# Patient Record
Sex: Male | Born: 1960 | Race: White | Hispanic: No | Smoking: Never smoker
Health system: Southern US, Community
[De-identification: ages and names within clinical notes are randomized; demographics above are authoritative.]

## PROBLEM LIST (undated history)

## (undated) DIAGNOSIS — F1010 Alcohol abuse, uncomplicated: Secondary | ICD-10-CM

## (undated) DIAGNOSIS — F19239 Other psychoactive substance dependence with withdrawal, unspecified: Secondary | ICD-10-CM

## (undated) DIAGNOSIS — K859 Acute pancreatitis without necrosis or infection, unspecified: Secondary | ICD-10-CM

## (undated) DIAGNOSIS — M542 Cervicalgia: Secondary | ICD-10-CM

## (undated) DIAGNOSIS — M549 Dorsalgia, unspecified: Secondary | ICD-10-CM

## (undated) DIAGNOSIS — R569 Unspecified convulsions: Secondary | ICD-10-CM

## (undated) DIAGNOSIS — F319 Bipolar disorder, unspecified: Secondary | ICD-10-CM

---

## 2006-05-03 ENCOUNTER — Emergency Department (HOSPITAL_COMMUNITY): Admission: EM | Admit: 2006-05-03 | Discharge: 2006-05-03 | Payer: Self-pay | Admitting: Emergency Medicine

## 2006-06-10 ENCOUNTER — Emergency Department (HOSPITAL_COMMUNITY): Admission: EM | Admit: 2006-06-10 | Discharge: 2006-06-10 | Payer: Self-pay | Admitting: Emergency Medicine

## 2007-01-14 ENCOUNTER — Emergency Department (HOSPITAL_COMMUNITY): Admission: EM | Admit: 2007-01-14 | Discharge: 2007-01-14 | Payer: Self-pay | Admitting: Emergency Medicine

## 2009-06-19 ENCOUNTER — Emergency Department (HOSPITAL_COMMUNITY): Admission: EM | Admit: 2009-06-19 | Discharge: 2009-06-20 | Payer: Self-pay | Admitting: Emergency Medicine

## 2009-06-20 ENCOUNTER — Inpatient Hospital Stay (HOSPITAL_COMMUNITY): Admission: AD | Admit: 2009-06-20 | Discharge: 2009-06-25 | Payer: Self-pay | Admitting: Psychiatry

## 2009-06-20 ENCOUNTER — Ambulatory Visit: Payer: Self-pay | Admitting: Psychiatry

## 2010-06-10 LAB — HEPATIC FUNCTION PANEL
Albumin: 3.9 g/dL (ref 3.5–5.2)
Indirect Bilirubin: 0.7 mg/dL (ref 0.3–0.9)
Total Protein: 7.3 g/dL (ref 6.0–8.3)

## 2010-06-15 LAB — CBC
HCT: 46 % (ref 39.0–52.0)
MCHC: 35 g/dL (ref 30.0–36.0)
MCV: 95 fL (ref 78.0–100.0)
Platelets: 307 10*3/uL (ref 150–400)
RDW: 14.6 % (ref 11.5–15.5)
WBC: 9.8 10*3/uL (ref 4.0–10.5)

## 2010-06-15 LAB — DIFFERENTIAL
Eosinophils Absolute: 0 10*3/uL (ref 0.0–0.7)
Eosinophils Relative: 0 % (ref 0–5)
Lymphs Abs: 2.9 10*3/uL (ref 0.7–4.0)

## 2010-06-15 LAB — BASIC METABOLIC PANEL
BUN: 3 mg/dL — ABNORMAL LOW (ref 6–23)
CO2: 20 mEq/L (ref 19–32)
Chloride: 101 mEq/L (ref 96–112)
Glucose, Bld: 141 mg/dL — ABNORMAL HIGH (ref 70–99)
Potassium: 3.2 mEq/L — ABNORMAL LOW (ref 3.5–5.1)

## 2010-06-15 LAB — RAPID URINE DRUG SCREEN, HOSP PERFORMED
Barbiturates: NOT DETECTED
Benzodiazepines: NOT DETECTED
Cocaine: NOT DETECTED

## 2010-07-20 ENCOUNTER — Inpatient Hospital Stay (HOSPITAL_COMMUNITY): Payer: Self-pay

## 2010-07-20 ENCOUNTER — Inpatient Hospital Stay (HOSPITAL_COMMUNITY)
Admission: EM | Admit: 2010-07-20 | Discharge: 2010-07-24 | DRG: 896 | Disposition: A | Discharge status: Home / Self Care | Payer: Self-pay | Attending: Internal Medicine | Admitting: Internal Medicine

## 2010-07-20 DIAGNOSIS — F10931 Alcohol use, unspecified with withdrawal delirium: Principal | ICD-10-CM | POA: Diagnosis present

## 2010-07-20 DIAGNOSIS — F1020 Alcohol dependence, uncomplicated: Secondary | ICD-10-CM | POA: Diagnosis present

## 2010-07-20 DIAGNOSIS — I10 Essential (primary) hypertension: Secondary | ICD-10-CM | POA: Diagnosis present

## 2010-07-20 DIAGNOSIS — K859 Acute pancreatitis without necrosis or infection, unspecified: Secondary | ICD-10-CM | POA: Diagnosis present

## 2010-07-20 DIAGNOSIS — R55 Syncope and collapse: Secondary | ICD-10-CM | POA: Diagnosis present

## 2010-07-20 DIAGNOSIS — E871 Hypo-osmolality and hyponatremia: Secondary | ICD-10-CM | POA: Diagnosis present

## 2010-07-20 DIAGNOSIS — F10231 Alcohol dependence with withdrawal delirium: Principal | ICD-10-CM | POA: Diagnosis present

## 2010-07-20 LAB — URINALYSIS, ROUTINE W REFLEX MICROSCOPIC
Glucose, UA: NEGATIVE mg/dL
Hgb urine dipstick: NEGATIVE
Specific Gravity, Urine: 1.014 (ref 1.005–1.030)
pH: 6 (ref 5.0–8.0)

## 2010-07-20 LAB — CBC
MCH: 33.7 pg (ref 26.0–34.0)
MCHC: 36.1 g/dL — ABNORMAL HIGH (ref 30.0–36.0)
Platelets: 367 10*3/uL (ref 150–400)
RBC: 4.13 MIL/uL — ABNORMAL LOW (ref 4.22–5.81)

## 2010-07-20 LAB — DIFFERENTIAL
Basophils Relative: 0 % (ref 0–1)
Eosinophils Absolute: 0 10*3/uL (ref 0.0–0.7)
Monocytes Relative: 4 % (ref 3–12)
Neutrophils Relative %: 86 % — ABNORMAL HIGH (ref 43–77)

## 2010-07-20 LAB — COMPREHENSIVE METABOLIC PANEL
AST: 58 U/L — ABNORMAL HIGH (ref 0–37)
Albumin: 4.3 g/dL (ref 3.5–5.2)
Alkaline Phosphatase: 67 U/L (ref 39–117)
Chloride: 87 mEq/L — ABNORMAL LOW (ref 96–112)
GFR calc Af Amer: >60 mL/min (ref >60)
Potassium: 4.8 mEq/L (ref 3.5–5.1)
Sodium: 128 mEq/L — ABNORMAL LOW (ref 135–145)
Total Bilirubin: 1.3 mg/dL — ABNORMAL HIGH (ref 0.3–1.2)

## 2010-07-20 LAB — RAPID URINE DRUG SCREEN, HOSP PERFORMED
Barbiturates: NOT DETECTED
Benzodiazepines: NOT DETECTED

## 2010-07-20 LAB — ETHANOL: Alcohol, Ethyl (B): 339 mg/dL — ABNORMAL HIGH (ref 0–10)

## 2010-07-21 ENCOUNTER — Inpatient Hospital Stay (HOSPITAL_COMMUNITY): Payer: Self-pay

## 2010-07-21 LAB — BASIC METABOLIC PANEL
BUN: 8 mg/dL (ref 6–23)
Chloride: 103 mEq/L (ref 96–112)
Creatinine, Ser: 0.75 mg/dL (ref 0.4–1.5)
Glucose, Bld: 94 mg/dL (ref 70–99)

## 2010-07-21 LAB — CBC
HCT: 36.4 % — ABNORMAL LOW (ref 39.0–52.0)
MCH: 32.6 pg (ref 26.0–34.0)
MCV: 95.8 fL (ref 78.0–100.0)
Platelets: 227 10*3/uL (ref 150–400)
RBC: 3.8 MIL/uL — ABNORMAL LOW (ref 4.22–5.81)
RDW: 13.7 % (ref 11.5–15.5)

## 2010-07-21 LAB — OSMOLALITY, URINE: Osmolality, Ur: 399 mOsm/kg (ref 390–1090)

## 2010-07-22 DIAGNOSIS — F1020 Alcohol dependence, uncomplicated: Secondary | ICD-10-CM

## 2010-07-22 LAB — BASIC METABOLIC PANEL
BUN: 4 mg/dL — ABNORMAL LOW (ref 6–23)
CO2: 25 mEq/L (ref 19–32)
GFR calc non Af Amer: >60 mL/min (ref >60)
Glucose, Bld: 162 mg/dL — ABNORMAL HIGH (ref 70–99)
Potassium: 3.6 mEq/L (ref 3.5–5.1)

## 2010-07-26 NOTE — Discharge Summary (Signed)
[name], LAMPERT                  ACCOUNT NO.:  [id number]  MEDICAL RECORD NO.:  [id number]           PATIENT TYPE:  I  LOCATION:  1520                         FACILITY:  [facility]  PHYSICIAN:  Brendia Sacks, MD    DATE OF BIRTH:  11-Jan-1961  DATE OF ADMISSION:  07/20/2010 DATE OF DISCHARGE:  07/24/2010                              DISCHARGE SUMMARY   PRIMARY CARE PHYSICIAN:  None, although he does see [facility].  CONDITION ON DISCHARGE:  Improved.  DISCHARGE DIAGNOSES: 1. Alcohol withdrawal, acute, resolved. 2. Hyponatremia secondary to potomania, resolved. 3. Hypertension, stable. 4. Possible acute versus chronic pancreatitis, resolved. 5. History of bipolar disorder, not currently on any medications.  HISTORY OF PRESENT ILLNESS:  This is a 50 year old Craig [name] who presented to the emergency room with acute alcohol intoxication and symptoms of acute alcohol withdrawal.  He was seen in consultation with Psychiatry because of his acute alcohol withdrawal.  He was admitted for further evaluation and treatment.  HOSPITAL COURSE: 1. Acute alcohol withdrawal, mild in nature.  The patient was admitted     to the medical floor.  He was placed on CIWA withdrawal protocol.     He is also seen in consultation with Psychiatry and clinical social     work.  The patient's withdrawal symptoms have resolved.  He is     feeling quite well and ready to go home.  He was offered inpatient     rehab but he did refuse this.  He was also offered outpatient     resources but again did not wish to pursue any of these.  He had no     additional Ativan since May 3 in the morning.  He is now stable for     discharge.  He has been counseled on alcohol cessation. 2. Alcohol intoxication.  This resolved with supportive care. 3. Hyponatremia secondary to potomania, resolved. 4. Hypertension.  This has remained stable.  He is placed on clonidine     and continue on b.i.d. therapy.  Again,  follow up in the outpatient     setting. 5. Possible acute versus subacute pancreatitis.  This resolved with     supportive care.  Consider further evaluation in the outpatient     setting as clinically indicated.  However, the patient discontinued     alcohol, this may not be an issue in the outpatient setting. 6. History of bipolar disorder.  He is not currently on any     medications and no more recommended at this time by Psychiatry.  CONSULTATIONS:  Psychiatry, recommendations as above.  I have discussed the case personally with Dr. Rogers Blocker on May 2, and it was felt that the patient was stable for discharge when his withdrawal is improved from Dr. Alvie Heidelberg standpoint.  PROCEDURES:  None.  IMAGING: 1. CT of the head April 30:  Mild premature atrophy.  No acute     intracranial findings. 2. Chest x-ray May 1:  Low lung volumes and bibasilar atelectasis. 3. Abdominal film May 1:  Negative.  No evidence of pancreatic  calcifications or other radiographic abnormality.  MICROBIOLOGY:  None.  PERTINENT LABORATORY STUDIES: 1. Urine drug screen was negative. 2. Serum alcohol on admission was 339. 3. Serum lipase was within normal limits. 4. Basic metabolic panel notable for sodium of 128 on admission and on     discharge 133.  Random glucose was mildly elevated at 162.  CBC     essentially unremarkable with a hemoglobin of 12.4 which appears to     be stable.  White blood cell count within normal limits on     discharge.  PHYSICAL EXAMINATION ON DISCHARGE:  GENERAL:  The patient is feeling quite while.  He is ready to go home.  He is eating well. VITAL SIGNS:  Temperature 98.3, pulse 77, respirations 18, blood pressure 120/79, saturation 95% on room air. CARDIOVASCULAR:  Regular rate and rhythm.  No murmur, rub or gallop. RESPIRATORY:  Clear to auscultation bilaterally.  No wheezes, rales or rhonchi.  Normal respiratory effort.  Speech is fluent and clear.  There is no  anxiousness or tremulousness seen.  DISCHARGE INSTRUCTIONS:  The patient will be discharged home today.  He has been provided resources by clinical social work as apparently he is homeless, but he has declined shelters or other assistance.  DIET:  Low-sodium, heart-healthy.  ACTIVITY:  Unrestricted.  RECOMMENDATIONS:  Recommend stop smoking, complete alcohol cessation. He should follow up in the [facility] in 1 to 2 weeks.  DISCHARGE MEDICATIONS: 1. Clonidine 0.1 mg p.o. b.i.d. 2. Multivitamin p.o. daily. 3. Zolpidem 5 mg one-half tablet p.o. q.h.s. as needed for insomnia.     Do not take this medication with alcohol.  Note, the patient has not been on Haldol or Cogentin in the outpatient setting.  Time coordinating discharge is Craig minutes.    Brendia Sacks, MD    DG/MEDQ  D:  07/24/2010  T:  07/24/2010  Job:  [id number]  Electronically Signed by Brendia Sacks  on 07/26/2010 04:34:34 PM

## 2010-07-27 NOTE — Discharge Summary (Signed)
  NAMECLENT, DAMORE                  ACCOUNT NO.:  [id number]  MEDICAL RECORD NO.:  [id number]           PATIENT TYPE:  I  LOCATION:  1520                         FACILITY:  [facility]  PHYSICIAN:  Brendia Sacks, MD    DATE OF BIRTH:  10-Jun-1960  DATE OF ADMISSION:  07/20/2010 DATE OF DISCHARGE:  07/24/2010                              DISCHARGE SUMMARY   ADDENDUM  This is a brief addendum to the patient's hospitalization from July 20, 2010, to Jul 24, 2010.  DISCHARGE MEDICATIONS:  Patient was discharged home on: 1. Multivitamin p.o. daily. 2. Zolpidem 5 mg 1/2 tablet p.o. q.h.s. as needed. 3. Cogentin 1 tablet p.o. q.h.s. 4. Haldol 2 mg p.o. q.h.s.  Discontinue the following medications:  Benadryl.  Note that the patient takes Cogentin and Haldol as an outpatient as per [facility].     Brendia Sacks, MD     DG/MEDQ  D:  07/26/2010  T:  07/26/2010  Job:  [id number]  Electronically Signed by Brendia Sacks  on 07/27/2010 06:02:17 PM

## 2010-08-19 NOTE — H&P (Signed)
NAMEEPHREM, Craig [name]                  ACCOUNT NO.:  [id number]  MEDICAL RECORD NO.:  [id number]           PATIENT TYPE:  E  LOCATION:  WLED                         FACILITY:  [facility]  PHYSICIAN:  Mauro Kaufmann, MD         DATE OF BIRTH:  06-17-1960  DATE OF ADMISSION:  07/20/2010 DATE OF DISCHARGE:                             HISTORY & PHYSICAL   PRIMARY CARE PHYSICIAN:  The patient has no signed primary care physician.  CHIEF COMPLAINT:  Alcohol intoxication.  HISTORY OF PRESENT ILLNESS:  This is a 50 year old male who came to the hospital after he says that he has been drinking alcohol for past 7 days.  The patient says that he does have a history of alcohol abuse and has been drinking since he was 50 years old, but over the past 7 days, he has been drinking almost every day from morning to evening.  The patient says that he drank almost 10-12 40 ounce beers every day.  The reason for the binge drinking is that the patient says that he is having some issues with his girlfriend and to cover the emotional pain, he is drinking alcohol.  He admits to passing out from drinking alcohol and also admits to having nausea and vomiting, but he denies any head injury.  Denies any seizures.  No history of any recent chest pain, but he admits to having some anxiety and some associated shortness of breath along with that.  Denies any abdominal pain, though he has a history of pancreatitis in the past.  PAST MEDICAL HISTORY:  Significant for, 1. Chronic alcohol abuse. 2. Bipolar disorder. 3. Depression. 4. Pancreatitis. 5. Alcohol withdrawal seizures.  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  The patient says that he drinks most of the days of the week and the only day he does not drink is when he cannot afford it.  He denies any tobacco abuse.  Denies any illicit drug abuse.  The patient is not married.  He lives with his  girlfriend.  ALLERGIES:  None.  FAMILY HISTORY:  Noncontributory.  REVIEW OF SYSTEMS:  There is positive headache, positive blurred vision, possible passing out spell.  NECK:  No history of thyroid problems. CHEST:  No history of emphysema or asthma.  HEART:  No history of CAD. GI: As in HPI.  GENITOURINARY:  No dysuria, urgency or frequency of urination.  NEUROLOGICAL:  There is positive history of seizures due to the alcohol withdrawal.  No history of stroke in the past.  CURRENT MEDICATIONS: 1. Haldol 2 mg. 2. Cogentin. The patient was prescribed these medications from [facility] after he told that he was having alcohol withdrawal.     The patient says that he was prescribed these medications a month     ago.  PHYSICAL EXAMINATION:  VITAL SIGNS:  The patient's blood pressure is 152/107, temperature is 98.5, pulse 112, respirations 20, pulse ox 95%. HEENT:  Head is atraumatic, normocephalic.  Eyes, extraocular movements are  intact.  Oral mucosa is pink and moist. NECK:  Supple. CHEST:  Clear to auscultation bilaterally. HEART:  S1 and S2 is regular in rate and rhythm. ABDOMEN:  Mild diffuse tenderness noted, but there is no rigidity or guarding. EXTREMITIES:  There is no cyanosis, clubbing, or edema. NEUROLOGICAL:  Cranial nerve II through XII are grossly intact.  Motor strength is 5/5 on both upper and lower extremities.  There is no focal deficit noted at this time.  PERTINENT LABS:  WBC of 13.8, hemoglobin is 13.9, hematocrit 38.5, platelet count of 367, neutrophils 86%.  Sodium 128, potassium is 4.8, chloride is 87, CO2 of 22, BUN 11, creatinine 0.58.  Glucose is 116. AST is 58, ALT is 40.  UA is only shows trace ketones, alcohol level is 339.  Urine drug screen is negative.  ASSESSMENT: 1. Alcohol intoxication. 2. Hyponatremia. 3. Elevated blood pressure. 4. Leukocytosis.  PLAN: 1. Alcohol intoxication.  The patient will be admitted to the medical      regular floor along with telemetry and we will put on alcohol     withdrawal precautions.  I will put the patient on Ativan, CIWA     protocol.  Also the patient will be started on thiamine and folic     acid. 2. Hyponatremia.  This is most likely secondary to beer potomania.  We     are going to check the urine and serum osmolality and also follow     the BMET in the morning. 3. Elevated blood pressure.  This is most likely secondary to alcohol     withdrawal.  I am going to start the patient on hydralazine 25 mg     p.o. q.6 h. p.r.n. for BP more than 160/100. 4. Bipolar disorder.  The patient was seen by psych in the ER and     needs to follow up with a psych once his acute alcohol issues are     resolved and he is to either go to the [facility] or he     will be seen by psych in the hospital. 5. Deep venous thrombosis prophylaxis with SCDs.  I am going to obtain     a CT scan of the head to rule out any underlying intracranial     pathology.  Then, the patient can be put on Lovenox. 6. Syncope most likely secondary to the alcohol intoxication as the     patient says that he kept on drinking alcohol until he passed out     and then after he regained consciousness, he would start drinking     again.  So I am going to obtain a CT scan of the head to rule out     any intracranial pathology this time.  Again there is no     neurological deficit noted at this time.    Mauro Kaufmann, MD    GL/MEDQ  D:  07/20/2010  T:  07/20/2010  Job:  [id number]  Electronically Signed by Mauro Kaufmann  on 08/19/2010 03:37:38 PM

## 2010-11-21 ENCOUNTER — Emergency Department (HOSPITAL_COMMUNITY): Payer: Self-pay

## 2010-11-21 ENCOUNTER — Emergency Department (HOSPITAL_COMMUNITY)
Admission: EM | Admit: 2010-11-21 | Discharge: 2010-11-22 | Disposition: A | Discharge status: Home / Self Care | Payer: Self-pay | Attending: Emergency Medicine | Admitting: Emergency Medicine

## 2010-11-21 DIAGNOSIS — F29 Unspecified psychosis not due to a substance or known physiological condition: Secondary | ICD-10-CM | POA: Insufficient documentation

## 2010-11-21 DIAGNOSIS — IMO0002 Reserved for concepts with insufficient information to code with codable children: Secondary | ICD-10-CM | POA: Insufficient documentation

## 2010-11-21 DIAGNOSIS — W19XXXA Unspecified fall, initial encounter: Secondary | ICD-10-CM | POA: Insufficient documentation

## 2010-11-21 DIAGNOSIS — F1010 Alcohol abuse, uncomplicated: Secondary | ICD-10-CM | POA: Insufficient documentation

## 2010-11-21 LAB — DIFFERENTIAL
Basophils Absolute: 0 10*3/uL (ref 0.0–0.1)
Eosinophils Relative: 2 % (ref 0–5)
Lymphocytes Relative: 39 % (ref 12–46)
Lymphs Abs: 2.4 10*3/uL (ref 0.7–4.0)
Monocytes Absolute: 0.5 10*3/uL (ref 0.1–1.0)
Monocytes Relative: 7 % (ref 3–12)

## 2010-11-21 LAB — CBC
HCT: 35.5 % — ABNORMAL LOW (ref 39.0–52.0)
MCH: 33.2 pg (ref 26.0–34.0)
MCHC: 35.2 g/dL (ref 30.0–36.0)
MCV: 94.2 fL (ref 78.0–100.0)
RDW: 13.8 % (ref 11.5–15.5)

## 2010-11-22 LAB — COMPREHENSIVE METABOLIC PANEL
ALT: 30 U/L (ref 0–53)
AST: 34 U/L (ref 0–37)
Albumin: 3.6 g/dL (ref 3.5–5.2)
Alkaline Phosphatase: 64 U/L (ref 39–117)
BUN: 9 mg/dL (ref 6–23)
CO2: 22 mEq/L (ref 19–32)
Chloride: 112 mEq/L (ref 96–112)
Glucose, Bld: 116 mg/dL — ABNORMAL HIGH (ref 70–99)
Total Protein: 7 g/dL (ref 6.0–8.3)

## 2010-11-22 LAB — ETHANOL: Alcohol, Ethyl (B): 382 mg/dL — ABNORMAL HIGH (ref 0–11)

## 2010-12-30 LAB — CBC
HCT: 42
Hemoglobin: 15
MCHC: 35.7
RDW: 14.6 — ABNORMAL HIGH

## 2010-12-30 LAB — URINALYSIS, ROUTINE W REFLEX MICROSCOPIC
Glucose, UA: NEGATIVE
Hgb urine dipstick: NEGATIVE
Specific Gravity, Urine: 1.005
Urobilinogen, UA: 0.2
pH: 6

## 2010-12-30 LAB — RAPID URINE DRUG SCREEN, HOSP PERFORMED
Amphetamines: NOT DETECTED
Barbiturates: NOT DETECTED
Opiates: NOT DETECTED

## 2010-12-30 LAB — DIFFERENTIAL
Basophils Absolute: 0.1
Basophils Relative: 1
Eosinophils Absolute: 0
Eosinophils Relative: 0
Monocytes Absolute: 0.5

## 2010-12-30 LAB — BASIC METABOLIC PANEL
CO2: 24
Calcium: 8.4
Glucose, Bld: 114 — ABNORMAL HIGH
Potassium: 4
Sodium: 131 — ABNORMAL LOW

## 2011-01-02 ENCOUNTER — Emergency Department (HOSPITAL_COMMUNITY): Payer: Self-pay

## 2011-01-02 ENCOUNTER — Emergency Department (HOSPITAL_COMMUNITY)
Admission: EM | Admit: 2011-01-02 | Discharge: 2011-01-03 | Disposition: A | Discharge status: Home / Self Care | Payer: Self-pay | Attending: Emergency Medicine | Admitting: Emergency Medicine

## 2011-01-02 DIAGNOSIS — R51 Headache: Secondary | ICD-10-CM | POA: Insufficient documentation

## 2011-01-02 DIAGNOSIS — F1010 Alcohol abuse, uncomplicated: Secondary | ICD-10-CM | POA: Insufficient documentation

## 2011-01-02 LAB — ETHANOL: Alcohol, Ethyl (B): 380 mg/dL — ABNORMAL HIGH (ref 0–11)

## 2011-01-02 LAB — POCT I-STAT, CHEM 8
Calcium, Ion: 1.06 mmol/L — ABNORMAL LOW (ref 1.12–1.32)
HCT: 36 % — ABNORMAL LOW (ref 39.0–52.0)
TCO2: 22 mmol/L (ref 0–100)

## 2011-01-03 ENCOUNTER — Emergency Department (HOSPITAL_COMMUNITY)
Admission: EM | Admit: 2011-01-03 | Discharge: 2011-01-03 | Disposition: A | Discharge status: Home / Self Care | Payer: Self-pay | Attending: Emergency Medicine | Admitting: Emergency Medicine

## 2011-01-03 ENCOUNTER — Emergency Department (HOSPITAL_COMMUNITY): Payer: Self-pay

## 2011-01-03 ENCOUNTER — Emergency Department (HOSPITAL_COMMUNITY)
Admission: EM | Admit: 2011-01-03 | Discharge: 2011-01-04 | Disposition: A | Discharge status: Home / Self Care | Payer: Self-pay | Attending: Emergency Medicine | Admitting: Emergency Medicine

## 2011-01-03 DIAGNOSIS — F1010 Alcohol abuse, uncomplicated: Secondary | ICD-10-CM | POA: Insufficient documentation

## 2011-01-03 DIAGNOSIS — R000 Tachycardia, unspecified: Secondary | ICD-10-CM | POA: Insufficient documentation

## 2011-01-03 DIAGNOSIS — H5500 Unspecified nystagmus: Secondary | ICD-10-CM | POA: Insufficient documentation

## 2011-01-03 LAB — BASIC METABOLIC PANEL
CO2: 23 mEq/L (ref 19–32)
Chloride: 111 mEq/L (ref 96–112)
Creatinine, Ser: 0.53 mg/dL (ref 0.50–1.35)
Potassium: 3.7 mEq/L (ref 3.5–5.1)

## 2011-01-03 LAB — CBC
Hemoglobin: 11.1 g/dL — ABNORMAL LOW (ref 13.0–17.0)
MCH: 33.1 pg (ref 26.0–34.0)
MCV: 95.5 fL (ref 78.0–100.0)
RBC: 3.35 MIL/uL — ABNORMAL LOW (ref 4.22–5.81)

## 2011-01-03 LAB — DIFFERENTIAL
Basophils Relative: 0 % (ref 0–1)
Lymphs Abs: 1.9 10*3/uL (ref 0.7–4.0)
Monocytes Relative: 6 % (ref 3–12)
Neutro Abs: 3.4 10*3/uL (ref 1.7–7.7)
Neutrophils Relative %: 60 % (ref 43–77)

## 2011-01-03 LAB — ETHANOL: Alcohol, Ethyl (B): 341 mg/dL — ABNORMAL HIGH (ref 0–11)

## 2011-01-04 ENCOUNTER — Emergency Department (HOSPITAL_COMMUNITY)
Admission: EM | Admit: 2011-01-04 | Discharge: 2011-01-05 | Disposition: A | Discharge status: Home / Self Care | Payer: Self-pay | Attending: Emergency Medicine | Admitting: Emergency Medicine

## 2011-01-04 ENCOUNTER — Emergency Department (HOSPITAL_COMMUNITY)
Admission: EM | Admit: 2011-01-04 | Discharge: 2011-01-04 | Disposition: A | Discharge status: Home / Self Care | Payer: Self-pay | Attending: Emergency Medicine | Admitting: Emergency Medicine

## 2011-01-04 DIAGNOSIS — F1010 Alcohol abuse, uncomplicated: Secondary | ICD-10-CM | POA: Insufficient documentation

## 2011-01-04 DIAGNOSIS — F319 Bipolar disorder, unspecified: Secondary | ICD-10-CM | POA: Insufficient documentation

## 2011-01-04 LAB — GLUCOSE, CAPILLARY

## 2011-01-04 LAB — CBC
HCT: 34 % — ABNORMAL LOW (ref 39.0–52.0)
Hemoglobin: 11.7 g/dL — ABNORMAL LOW (ref 13.0–17.0)
MCV: 95.5 fL (ref 78.0–100.0)
RDW: 14.1 % (ref 11.5–15.5)
WBC: 5.1 10*3/uL (ref 4.0–10.5)

## 2011-01-04 LAB — COMPREHENSIVE METABOLIC PANEL
ALT: 32 U/L (ref 0–53)
AST: 53 U/L — ABNORMAL HIGH (ref 0–37)
Albumin: 3.8 g/dL (ref 3.5–5.2)
Alkaline Phosphatase: 77 U/L (ref 39–117)
CO2: 24 mEq/L (ref 19–32)
Chloride: 113 mEq/L — ABNORMAL HIGH (ref 96–112)
GFR calc non Af Amer: >90 mL/min (ref >90)
Potassium: 3.6 mEq/L (ref 3.5–5.1)
Sodium: 149 mEq/L — ABNORMAL HIGH (ref 135–145)
Total Bilirubin: 0.2 mg/dL — ABNORMAL LOW (ref 0.3–1.2)

## 2011-01-04 LAB — ETHANOL: Alcohol, Ethyl (B): 395 mg/dL — ABNORMAL HIGH (ref 0–11)

## 2011-01-05 ENCOUNTER — Emergency Department (HOSPITAL_COMMUNITY): Payer: Self-pay

## 2011-01-05 ENCOUNTER — Emergency Department (HOSPITAL_COMMUNITY)
Admission: EM | Admit: 2011-01-05 | Discharge: 2011-01-06 | Disposition: A | Discharge status: Home / Self Care | Payer: Self-pay | Attending: Emergency Medicine | Admitting: Emergency Medicine

## 2011-01-05 DIAGNOSIS — F1010 Alcohol abuse, uncomplicated: Secondary | ICD-10-CM | POA: Insufficient documentation

## 2011-01-05 DIAGNOSIS — Z043 Encounter for examination and observation following other accident: Secondary | ICD-10-CM | POA: Insufficient documentation

## 2011-01-05 DIAGNOSIS — W19XXXA Unspecified fall, initial encounter: Secondary | ICD-10-CM | POA: Insufficient documentation

## 2011-01-05 DIAGNOSIS — E876 Hypokalemia: Secondary | ICD-10-CM | POA: Insufficient documentation

## 2011-01-05 DIAGNOSIS — F3130 Bipolar disorder, current episode depressed, mild or moderate severity, unspecified: Secondary | ICD-10-CM | POA: Insufficient documentation

## 2011-01-05 DIAGNOSIS — Y9229 Other specified public building as the place of occurrence of the external cause: Secondary | ICD-10-CM | POA: Insufficient documentation

## 2011-01-05 DIAGNOSIS — IMO0002 Reserved for concepts with insufficient information to code with codable children: Secondary | ICD-10-CM | POA: Insufficient documentation

## 2011-01-05 DIAGNOSIS — Y998 Other external cause status: Secondary | ICD-10-CM | POA: Insufficient documentation

## 2011-01-05 LAB — CBC
HCT: 32.5 % — ABNORMAL LOW (ref 39.0–52.0)
MCH: 32.8 pg (ref 26.0–34.0)
MCV: 96.2 fL (ref 78.0–100.0)
Platelets: 198 10*3/uL (ref 150–400)
RDW: 14.2 % (ref 11.5–15.5)

## 2011-01-05 LAB — COMPREHENSIVE METABOLIC PANEL
AST: 50 U/L — ABNORMAL HIGH (ref 0–37)
Albumin: 3.6 g/dL (ref 3.5–5.2)
BUN: 7 mg/dL (ref 6–23)
CO2: 27 mEq/L (ref 19–32)
Calcium: 8.3 mg/dL — ABNORMAL LOW (ref 8.4–10.5)
Chloride: 110 mEq/L (ref 96–112)
Creatinine, Ser: 0.61 mg/dL (ref 0.50–1.35)
GFR calc non Af Amer: >90 mL/min (ref >90)
Total Bilirubin: 0.2 mg/dL — ABNORMAL LOW (ref 0.3–1.2)

## 2011-01-05 LAB — DIFFERENTIAL
Eosinophils Absolute: 0.1 10*3/uL (ref 0.0–0.7)
Eosinophils Relative: 2 % (ref 0–5)
Lymphocytes Relative: 44 % (ref 12–46)
Lymphs Abs: 2 10*3/uL (ref 0.7–4.0)
Monocytes Relative: 8 % (ref 3–12)

## 2011-01-05 LAB — RAPID URINE DRUG SCREEN, HOSP PERFORMED
Cocaine: NOT DETECTED
Opiates: NOT DETECTED
Tetrahydrocannabinol: NOT DETECTED

## 2011-01-05 LAB — ETHANOL: Alcohol, Ethyl (B): 437 mg/dL (ref 0–11)

## 2011-03-18 ENCOUNTER — Emergency Department (HOSPITAL_COMMUNITY)
Admission: EM | Admit: 2011-03-18 | Discharge: 2011-03-18 | Disposition: A | Discharge status: Home / Self Care | Payer: Self-pay | Attending: Emergency Medicine | Admitting: Emergency Medicine

## 2011-03-18 ENCOUNTER — Encounter: Payer: Self-pay | Admitting: *Deleted

## 2011-03-18 DIAGNOSIS — M545 Low back pain, unspecified: Secondary | ICD-10-CM | POA: Insufficient documentation

## 2011-03-18 DIAGNOSIS — M546 Pain in thoracic spine: Secondary | ICD-10-CM | POA: Insufficient documentation

## 2011-03-18 DIAGNOSIS — M542 Cervicalgia: Secondary | ICD-10-CM | POA: Insufficient documentation

## 2011-03-18 DIAGNOSIS — G8929 Other chronic pain: Secondary | ICD-10-CM | POA: Insufficient documentation

## 2011-03-18 DIAGNOSIS — M549 Dorsalgia, unspecified: Secondary | ICD-10-CM

## 2011-03-18 HISTORY — DX: Cervicalgia: M54.2

## 2011-03-18 HISTORY — DX: Dorsalgia, unspecified: M54.9

## 2011-03-18 MED ORDER — KETOROLAC TROMETHAMINE 60 MG/2ML IM SOLN
60.0000 mg | Freq: Once | INTRAMUSCULAR | Status: AC
  Administered 2011-03-18: 60 mg via INTRAMUSCULAR
  Filled 2011-03-18: qty 2

## 2011-03-18 MED ORDER — CYCLOBENZAPRINE HCL 10 MG PO TABS: 10.0000 mg | ORAL_TABLET | Freq: Two times a day (BID) | ORAL | Status: AC | PRN
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
## 2011-03-18 NOTE — ED Notes (Signed)
Pt was in no acute distress and ambulated out of ED.

## 2011-04-06 ENCOUNTER — Encounter (HOSPITAL_COMMUNITY): Payer: Self-pay | Admitting: *Deleted

## 2011-04-06 ENCOUNTER — Emergency Department (HOSPITAL_COMMUNITY)
Admission: EM | Admit: 2011-04-06 | Discharge: 2011-04-07 | Disposition: A | Discharge status: Home / Self Care | Payer: Self-pay | Attending: Emergency Medicine | Admitting: Emergency Medicine

## 2011-04-06 DIAGNOSIS — M549 Dorsalgia, unspecified: Secondary | ICD-10-CM | POA: Insufficient documentation

## 2011-04-06 DIAGNOSIS — F10929 Alcohol use, unspecified with intoxication, unspecified: Secondary | ICD-10-CM

## 2011-04-06 DIAGNOSIS — Z79899 Other long term (current) drug therapy: Secondary | ICD-10-CM | POA: Insufficient documentation

## 2011-04-06 DIAGNOSIS — M542 Cervicalgia: Secondary | ICD-10-CM | POA: Insufficient documentation

## 2011-04-06 DIAGNOSIS — R4789 Other speech disturbances: Secondary | ICD-10-CM | POA: Insufficient documentation

## 2011-04-06 DIAGNOSIS — IMO0002 Reserved for concepts with insufficient information to code with codable children: Secondary | ICD-10-CM

## 2011-04-06 HISTORY — DX: Bipolar disorder, unspecified: F31.9

## 2011-04-06 LAB — COMPREHENSIVE METABOLIC PANEL
ALT: 15 U/L (ref 0–53)
BUN: 11 mg/dL (ref 6–23)
CO2: 24 mEq/L (ref 19–32)
Calcium: 8.6 mg/dL (ref 8.4–10.5)
Creatinine, Ser: 0.69 mg/dL (ref 0.50–1.35)
GFR calc Af Amer: >90 mL/min (ref >90)
GFR calc non Af Amer: >90 mL/min (ref >90)
Glucose, Bld: 94 mg/dL (ref 70–99)
Total Protein: 7.7 g/dL (ref 6.0–8.3)

## 2011-04-06 LAB — CBC
Hemoglobin: 12.4 g/dL — ABNORMAL LOW (ref 13.0–17.0)
MCH: 31.1 pg (ref 26.0–34.0)
MCHC: 34.9 g/dL (ref 30.0–36.0)
MCV: 89 fL (ref 78.0–100.0)
RBC: 3.99 MIL/uL — ABNORMAL LOW (ref 4.22–5.81)

## 2011-04-06 LAB — RAPID URINE DRUG SCREEN, HOSP PERFORMED
Barbiturates: POSITIVE — AB
Benzodiazepines: NOT DETECTED
Cocaine: NOT DETECTED
Opiates: NOT DETECTED

## 2011-04-06 LAB — ETHANOL: Alcohol, Ethyl (B): 307 mg/dL — ABNORMAL HIGH (ref 0–11)

## 2011-04-06 MED ORDER — SODIUM CHLORIDE 0.9 % IV BOLUS (SEPSIS): 1000.0000 mL | Freq: Once | INTRAVENOUS | Status: DC

## 2011-04-06 MED ORDER — SODIUM CHLORIDE 0.9 % IV BOLUS (SEPSIS)
1000.0000 mL | Freq: Once | INTRAVENOUS | Status: AC
  Administered 2011-04-06: 1000 mL via INTRAVENOUS

## 2011-04-06 NOTE — ED Notes (Signed)
Pt has not been brought back to bed at this time.

## 2011-04-06 NOTE — ED Notes (Signed)
Pt states he drinks a lot of alcohol everyday and has no desire to seek treatment for etoh abuse. States "I know it's going to kill me, but I don't give a shit." Pt is not happy about being here and is only staying to appease [employer]. Pt is A/O x4. Skin warm and dry. Respirations even and unlabored. NAD noted at this time.

## 2011-04-06 NOTE — ED Notes (Signed)
Pt in via [employer] due to ETOH intoxication and family wants pt evaluated for bipolar disorder. Pt is not requesting detox at this time.  Pt speech is slurred- pt states he does not want to stay.

## 2011-04-06 NOTE — ED Notes (Signed)
Pt ambultated to bedthroom with assistance. Pt has unsteady gait. Tech required to hold pt several times to maintain balance.

## 2011-04-07 ENCOUNTER — Emergency Department (HOSPITAL_COMMUNITY): Payer: Self-pay

## 2011-04-07 NOTE — ED Notes (Signed)
Patient transported to CT 

## 2011-04-07 NOTE — ED Notes (Signed)
Pt ambulated without difficulty

## 2011-04-07 NOTE — ED Notes (Signed)
Pt given sandwich and beverage.

## 2011-04-07 NOTE — ED Provider Notes (Addendum)
History     CSN: [id number]  Arrival date & time 04/06/11  1605   First MD Initiated Contact with Patient 04/06/11 1724      Chief Complaint  Patient presents with  . Medical Clearance  . Alcohol Intoxication    (Consider location/radiation/quality/duration/timing/severity/associated sxs/prior treatment) HPI Patient is a 51 year old male with history of numerous presentations for alcohol intoxication to this emergency department. He presents today after having heavy drinking. His girlfriend contacted [employer] who transferred the patient today. There were no concerns for suicidal or homicidal ideation. Patient's girlfriend is concerned he could have bipolar disorder by report but she is not present at this time. Patient smelled of alcohol and has limited history given his intoxication. He is on any signs of head trauma. Speech is slightly slurred the patient is relatively oriented. He denies any pain at this time. There no other associated modifying factors. Past Medical History  Diagnosis Date  . Back pain   . Neck pain   . Bipolar 1 disorder     History reviewed. No pertinent past surgical history.  History reviewed. No pertinent family history.  History  Substance Use Topics  . Smoking status: Never Smoker   . Smokeless tobacco: Not on file  . Alcohol Use: Yes      Review of Systems  Unable to perform ROS: Other  Patient intoxication  Allergies  Review of patient's allergies indicates no known allergies.  Home Medications   Current Outpatient Rx  Name Route Sig Dispense Refill  . ACETAMINOPHEN 500 MG PO TABS Oral Take 1,000 mg by mouth every 6 (six) hours as needed. For pain    . CYCLOBENZAPRINE HCL 10 MG PO TABS Oral Take 10 mg by mouth 2 (two) times daily as needed.    . IBUPROFEN 200 MG PO TABS Oral Take 800 mg by mouth every 6 (six) hours as needed. For pain      BP 130/84  Pulse 87  Temp(Src) 97.7 F (36.5 C) (Oral)  Resp 16  SpO2  97%  Physical Exam  Nursing note and vitals reviewed. Constitutional: He appears well-developed and well-nourished. No distress.  HENT:  Head: Normocephalic and atraumatic.  Eyes: Conjunctivae and EOM are normal. Pupils are equal, round, and reactive to light.  Neck: Normal range of motion.  Cardiovascular: Normal rate, regular rhythm, normal heart sounds and intact distal pulses.  Exam reveals no gallop and no friction rub.   No murmur heard. Pulmonary/Chest: Effort normal and breath sounds normal. No respiratory distress. He has no wheezes. He has no rales.  Abdominal: Soft. Bowel sounds are normal. There is no tenderness.  Musculoskeletal: Normal range of motion.  Neurological:       Patient is able to move all 4 extremities. He is somnolent and has slurred speech. Patient does not alcohol. Exam is otherwise limited secondary to patient condition.  Skin: Skin is warm and dry. No rash noted.    ED Course  Procedures (including critical care time)  Labs Reviewed  CBC - Abnormal; Notable for the following:    RBC 3.99 (*)    Hemoglobin 12.4 (*)    HCT 35.5 (*)    All other components within normal limits  COMPREHENSIVE METABOLIC PANEL - Abnormal; Notable for the following:    Sodium 148 (*)    Chloride 113 (*)    Total Bilirubin 0.2 (*)    All other components within normal limits  ETHANOL - Abnormal; Notable for the following:  Alcohol, Ethyl (B) 307 (*)    All other components within normal limits  URINE RAPID DRUG SCREEN (HOSP PERFORMED) - Abnormal; Notable for the following:    Barbiturates POSITIVE (*)    All other components within normal limits   No results found.   1. Intoxication       MDM  Patient was evaluated by myself. Based on his presentation there is no report of suicidal or homicidal ideation. Patient had a CBC, CMP, ethanol level, and urine drug screen ordered. Patient saw a level was 307 and his urine was positive for barbiturates. Patient was  given 2 L normal saline IV bolus. He was reassessed. Patient continued to be pleasantly conversational but was unable to ambulate without assistance. Given that he had no focal neurologic deficits on reassessment I did not feel that further imaging was necessary. At this time patient is sleeping and will await reassessment for improvement in his ability to ambulate.        Cyndra Numbers, MD 04/07/11 0105   Results for orders placed during the hospital encounter of 04/06/11  CBC      Component Value Range   WBC 6.1  4.0 - 10.5 (K/uL)   RBC 3.99 (*) 4.22 - 5.81 (MIL/uL)   Hemoglobin 12.4 (*) 13.0 - 17.0 (g/dL)   HCT 96.0 (*) 45.4 - 52.0 (%)   MCV 89.0  78.0 - 100.0 (fL)   MCH 31.1  26.0 - 34.0 (pg)   MCHC 34.9  30.0 - 36.0 (g/dL)   RDW 09.8  11.9 - 14.7 (%)   Platelets 276  150 - 400 (K/uL)  COMPREHENSIVE METABOLIC PANEL      Component Value Range   Sodium 148 (*) 135 - 145 (mEq/L)   Potassium 3.6  3.5 - 5.1 (mEq/L)   Chloride 113 (*) 96 - 112 (mEq/L)   CO2 24  19 - 32 (mEq/L)   Glucose, Bld 94  70 - 99 (mg/dL)   BUN 11  6 - 23 (mg/dL)   Creatinine, Ser 8.29  0.50 - 1.35 (mg/dL)   Calcium 8.6  8.4 - 56.2 (mg/dL)   Total Protein 7.7  6.0 - 8.3 (g/dL)   Albumin 3.6  3.5 - 5.2 (g/dL)   AST 17  0 - 37 (U/L)   ALT 15  0 - 53 (U/L)   Alkaline Phosphatase 84  39 - 117 (U/L)   Total Bilirubin 0.2 (*) 0.3 - 1.2 (mg/dL)   GFR calc non Af Amer >90  >90 (mL/min)   GFR calc Af Amer >90  >90 (mL/min)  ETHANOL      Component Value Range   Alcohol, Ethyl (B) 307 (*) 0 - 11 (mg/dL)  URINE RAPID DRUG SCREEN (HOSP PERFORMED)      Component Value Range   Opiates NONE DETECTED  NONE DETECTED    Cocaine NONE DETECTED  NONE DETECTED    Benzodiazepines NONE DETECTED  NONE DETECTED    Amphetamines NONE DETECTED  NONE DETECTED    Tetrahydrocannabinol NONE DETECTED  NONE DETECTED    Barbiturates POSITIVE (*) NONE DETECTED    Ct Head Wo Contrast  04/07/2011  *RADIOLOGY REPORT*  Clinical Data:  Medical clearance; found in parking lot.  CT HEAD WITHOUT CONTRAST  Technique:  Contiguous axial images were obtained from the base of the skull through the vertex without contrast.  Comparison: CT of the head performed 04/05/2011  Findings: There is no evidence of acute infarction, mass lesion, or intra- or extra-axial  hemorrhage on CT.  The posterior fossa, including the cerebellum, brainstem and fourth ventricle, is within normal limits.  The third and lateral ventricles, and basal ganglia are unremarkable in appearance.  The cerebral hemispheres are symmetric in appearance, with normal gray- white differentiation.  No mass effect or midline shift is seen.  There is no evidence of fracture; visualized osseous structures are unremarkable in appearance.  The orbits are within normal limits. Mucosal thickening is noted within the left maxillary sinus; the remaining paranasal sinuses and mastoid air cells are well-aerated. Mild soft tissue swelling is noted overlying the right posterior parietal calvarium.  IMPRESSION:  1.  No acute intracranial pathology seen on CT. 2.  Mild soft tissue swelling noted overlying the right posterior parietal calvarium. 3.  Mucosal thickening within the left maxillary sinus.  Original Report Authenticated By: Tonia Ghent, M.D.    Patient care accepted from Dr. Alto Denver. Patient with alcohol intoxication and history of same. Labs and CT scan obtained and reviewed as above. 5 AM patient ambulates up to the bathroom no acute distress. He was allowed further sober and call for at home. Patient stable for discharge. No SI or HI. Patient declines detox states understanding health risks associated with continued alcohol abuse. Outpatient referrals provided.  Sunnie Nielsen, MD 04/07/11 [phone]

## 2011-04-08 ENCOUNTER — Emergency Department (HOSPITAL_COMMUNITY)
Admission: EM | Admit: 2011-04-08 | Discharge: 2011-04-09 | Disposition: A | Discharge status: Home / Self Care | Payer: Self-pay | Attending: Emergency Medicine | Admitting: Emergency Medicine

## 2011-04-08 ENCOUNTER — Encounter (HOSPITAL_COMMUNITY): Payer: Self-pay | Admitting: Emergency Medicine

## 2011-04-08 DIAGNOSIS — F319 Bipolar disorder, unspecified: Secondary | ICD-10-CM | POA: Insufficient documentation

## 2011-04-08 DIAGNOSIS — F10929 Alcohol use, unspecified with intoxication, unspecified: Secondary | ICD-10-CM

## 2011-04-08 DIAGNOSIS — F1010 Alcohol abuse, uncomplicated: Secondary | ICD-10-CM | POA: Insufficient documentation

## 2011-04-08 LAB — DIFFERENTIAL
Basophils Relative: 0 % (ref 0–1)
Eosinophils Absolute: 0.1 10*3/uL (ref 0.0–0.7)
Eosinophils Relative: 2 % (ref 0–5)
Lymphs Abs: 2.8 10*3/uL (ref 0.7–4.0)
Monocytes Relative: 8 % (ref 3–12)

## 2011-04-08 LAB — CBC
HCT: 36.6 % — ABNORMAL LOW (ref 39.0–52.0)
Hemoglobin: 12.6 g/dL — ABNORMAL LOW (ref 13.0–17.0)
MCHC: 34.4 g/dL (ref 30.0–36.0)
MCV: 88.8 fL (ref 78.0–100.0)

## 2011-04-08 LAB — COMPREHENSIVE METABOLIC PANEL
Alkaline Phosphatase: 80 U/L (ref 39–117)
BUN: 9 mg/dL (ref 6–23)
Creatinine, Ser: 0.63 mg/dL (ref 0.50–1.35)
GFR calc Af Amer: >90 mL/min (ref >90)
Glucose, Bld: 125 mg/dL — ABNORMAL HIGH (ref 70–99)
Potassium: 3.1 mEq/L — ABNORMAL LOW (ref 3.5–5.1)
Total Bilirubin: 0.1 mg/dL — ABNORMAL LOW (ref 0.3–1.2)
Total Protein: 7.3 g/dL (ref 6.0–8.3)

## 2011-04-08 LAB — RAPID URINE DRUG SCREEN, HOSP PERFORMED
Amphetamines: NOT DETECTED
Barbiturates: POSITIVE — AB
Benzodiazepines: NOT DETECTED
Cocaine: NOT DETECTED

## 2011-04-08 LAB — ETHANOL: Alcohol, Ethyl (B): 408 mg/dL (ref 0–11)

## 2011-04-08 LAB — ACETAMINOPHEN LEVEL: Acetaminophen (Tylenol), Serum: <15 ug/mL (ref 10–30)

## 2011-04-08 MED ORDER — LORAZEPAM 2 MG/ML IJ SOLN
INTRAMUSCULAR | Status: AC
  Administered 2011-04-08: 2 mg via INTRAVENOUS
  Filled 2011-04-08: qty 1

## 2011-04-08 MED ORDER — HEPARIN (PORCINE) IN NACL 100-0.45 UNIT/ML-% IJ SOLN: 1000.0000 [IU]/h | INTRAMUSCULAR | Status: DC

## 2011-04-08 MED ORDER — THIAMINE HCL 100 MG/ML IJ SOLN
Freq: Once | INTRAVENOUS | Status: AC
  Administered 2011-04-08: 19:00:00 via INTRAVENOUS
  Filled 2011-04-08: qty 1000

## 2011-04-08 MED ORDER — LORAZEPAM 2 MG/ML IJ SOLN
2.0000 mg | Freq: Once | INTRAMUSCULAR | Status: AC
  Administered 2011-04-08: 2 mg via INTRAVENOUS

## 2011-04-08 MED ORDER — MIDAZOLAM HCL 2 MG/2ML IJ SOLN: 2.0000 mg | Freq: Once | INTRAMUSCULAR | Status: DC

## 2011-04-08 NOTE — ED Notes (Signed)
One lavender, one light blue, one light green, one dark green

## 2011-04-08 NOTE — ED Notes (Signed)
Per EMS: Pt was sleeping on floor of [employer] bathroom, reports he has been continually drinking for days, is oriented to self, situation. Denies injury and allergies.

## 2011-04-08 NOTE — ED Notes (Signed)
Pt yelling out of room.  When asking the patient what is wrong or what he needs, pt will not answer.

## 2011-04-08 NOTE — ED Provider Notes (Signed)
History     CSN: [id number]  Arrival date & time 04/08/11  1712   First MD Initiated Contact with Patient 04/08/11 1803      Chief Complaint  Patient presents with  . Alcohol Intoxication    (Consider location/radiation/quality/duration/timing/severity/associated sxs/prior treatment) Patient is a 51 y.o. male presenting with intoxication. The history is limited by the condition of the patient.  Alcohol Intoxication   the patient is a 51 year old male, who was brought to the emergency department for severe intoxication.  Level V caveat applies because he is in comprehensible, do to alcohol, which there is a very, very strong odor on his breath.  He does try to speak and he moves all his extremities and he has no evidence of significant injury.  Past Medical History  Diagnosis Date  . Back pain   . Neck pain   . Bipolar 1 disorder     No past surgical history on file.  No family history on file.  History  Substance Use Topics  . Smoking status: Never Smoker   . Smokeless tobacco: Not on file  . Alcohol Use: Yes     ETOH abuse      Review of Systems  Unable to perform ROS   Allergies  Review of patient's allergies indicates no known allergies.  Home Medications   Current Outpatient Rx  Name Route Sig Dispense Refill  . ACETAMINOPHEN 500 MG PO TABS Oral Take 1,000 mg by mouth every 6 (six) hours as needed. For pain    . CYCLOBENZAPRINE HCL 10 MG PO TABS Oral Take 10 mg by mouth 2 (two) times daily as needed.    . IBUPROFEN 200 MG PO TABS Oral Take 800 mg by mouth every 6 (six) hours as needed. For pain      BP 107/81  Pulse 98  Temp(Src) 98.3 F (36.8 C) (Oral)  Resp 18  SpO2 97%  Physical Exam  Constitutional: He appears well-developed and well-nourished. No distress.       Very strong odor of alcohol  HENT:  Head: Normocephalic and atraumatic.  Eyes: Conjunctivae are normal. Pupils are equal, round, and reactive to light.  Neck: Normal range of  motion. Neck supple.  Cardiovascular: Normal rate.   No murmur heard. Pulmonary/Chest: Effort normal. No respiratory distress.  Abdominal: Soft. Bowel sounds are normal. He exhibits no distension. There is no tenderness. There is no guarding.  Genitourinary: Penis normal.  Musculoskeletal: Normal range of motion.  Neurological: He is alert.       Moves all extremities  Skin: Skin is warm and dry.    ED Course  Procedures (including critical care time) Profound alcohol intoxication with no signs of underlying illness or injury.  He will establish an IV give banana bag, and perform laboratory testing, and monitor him until he becomes sober   Labs Reviewed  CBC  COMPREHENSIVE METABOLIC PANEL  ETHANOL  ACETAMINOPHEN LEVEL  URINE RAPID DRUG SCREEN (HOSP PERFORMED)  DIFFERENTIAL  LIPASE, BLOOD   Ct Head Wo Contrast  04/07/2011  *RADIOLOGY REPORT*  Clinical Data: Medical clearance; found in parking lot.  CT HEAD WITHOUT CONTRAST  Technique:  Contiguous axial images were obtained from the base of the skull through the vertex without contrast.  Comparison: CT of the head performed 04/05/2011  Findings: There is no evidence of acute infarction, mass lesion, or intra- or extra-axial hemorrhage on CT.  The posterior fossa, including the cerebellum, brainstem and fourth ventricle, is within normal limits.  The third and lateral ventricles, and basal ganglia are unremarkable in appearance.  The cerebral hemispheres are symmetric in appearance, with normal gray- white differentiation.  No mass effect or midline shift is seen.  There is no evidence of fracture; visualized osseous structures are unremarkable in appearance.  The orbits are within normal limits. Mucosal thickening is noted within the left maxillary sinus; the remaining paranasal sinuses and mastoid air cells are well-aerated. Mild soft tissue swelling is noted overlying the right posterior parietal calvarium.  IMPRESSION:  1.  No acute  intracranial pathology seen on CT. 2.  Mild soft tissue swelling noted overlying the right posterior parietal calvarium. 3.  Mucosal thickening within the left maxillary sinus.  Original Report Authenticated By: Tonia Ghent, M.D.     No diagnosis found.    MDM  Alcohol intoxication        Craig Stairs, MD 04/08/11 2245

## 2011-04-09 ENCOUNTER — Emergency Department (HOSPITAL_COMMUNITY)
Admission: EM | Admit: 2011-04-09 | Discharge: 2011-04-09 | Disposition: A | Discharge status: Home / Self Care | Payer: Self-pay | Attending: Emergency Medicine | Admitting: Emergency Medicine

## 2011-04-09 ENCOUNTER — Encounter (HOSPITAL_COMMUNITY): Payer: Self-pay

## 2011-04-09 ENCOUNTER — Other Ambulatory Visit: Payer: Self-pay

## 2011-04-09 DIAGNOSIS — F10129 Alcohol abuse with intoxication, unspecified: Secondary | ICD-10-CM

## 2011-04-09 DIAGNOSIS — F10229 Alcohol dependence with intoxication, unspecified: Secondary | ICD-10-CM | POA: Insufficient documentation

## 2011-04-09 DIAGNOSIS — R10816 Epigastric abdominal tenderness: Secondary | ICD-10-CM | POA: Insufficient documentation

## 2011-04-09 LAB — CBC
HCT: 36.6 % — ABNORMAL LOW (ref 39.0–52.0)
Hemoglobin: 12.7 g/dL — ABNORMAL LOW (ref 13.0–17.0)
MCH: 30.7 pg (ref 26.0–34.0)
MCV: 88.4 fL (ref 78.0–100.0)
RBC: 4.14 MIL/uL — ABNORMAL LOW (ref 4.22–5.81)

## 2011-04-09 LAB — DIFFERENTIAL
Eosinophils Absolute: 0.1 10*3/uL (ref 0.0–0.7)
Eosinophils Relative: 1 % (ref 0–5)
Lymphs Abs: 2.1 10*3/uL (ref 0.7–4.0)
Monocytes Absolute: 0.4 10*3/uL (ref 0.1–1.0)
Monocytes Relative: 6 % (ref 3–12)

## 2011-04-09 LAB — POCT I-STAT, CHEM 8
BUN: 11 mg/dL (ref 6–23)
Creatinine, Ser: 1.1 mg/dL (ref 0.50–1.35)
Glucose, Bld: 93 mg/dL (ref 70–99)
Potassium: 3.3 mEq/L — ABNORMAL LOW (ref 3.5–5.1)
Sodium: 152 mEq/L — ABNORMAL HIGH (ref 135–145)

## 2011-04-09 MED ORDER — ADULT MULTIVITAMIN W/MINERALS CH
1.0000 | ORAL_TABLET | Freq: Once | ORAL | Status: AC
  Administered 2011-04-09: 1 via ORAL
  Filled 2011-04-09: qty 1

## 2011-04-09 MED ORDER — THIAMINE HCL 100 MG/ML IJ SOLN: Freq: Once | INTRAVENOUS | Status: DC

## 2011-04-09 MED ORDER — THIAMINE HCL 100 MG/ML IJ SOLN
Freq: Once | INTRAVENOUS | Status: AC
  Administered 2011-04-09: 14:00:00 via INTRAVENOUS
  Filled 2011-04-09: qty 1000

## 2011-04-09 NOTE — ED Notes (Signed)
Report received from night RN. First contact with patient. Pt is sleeping in room. Comfort measured ensured. nad noted. Will continue to monitor.

## 2011-04-09 NOTE — ED Notes (Signed)
Pt brought by ems from front of [employer] per police request. Per police pt is too intoxicated to go to jail. Pt was released from this ed this am.

## 2011-04-09 NOTE — ED Notes (Signed)
Pt ambulate in hall with steady gait, pt also tol po intake well, was given dinner tray.

## 2011-04-09 NOTE — ED Provider Notes (Signed)
History     CSN: [id number]  Arrival date & time 04/09/11  1240   First MD Initiated Contact with Patient 04/09/11 1250      Chief Complaint  Patient presents with  . Alcohol Intoxication    (Consider location/radiation/quality/duration/timing/severity/associated sxs/prior treatment) HPI Comments: Patient brought in to ED today per police request as patient is in a public place heavily intoxicated.  This is patient's third visit to the ED this week for same.  Initially, patient was brought in with girlfriend's report that he was suicidal.  Today, patient is alert and argumentative, denies any pain.  Contradicts himself when speaking.    Patient is a 51 y.o. male presenting with intoxication. The history is provided by the patient and medical records.  Alcohol Intoxication    Past Medical History  Diagnosis Date  . Back pain   . Neck pain   . Bipolar 1 disorder     History reviewed. No pertinent past surgical history.  History reviewed. No pertinent family history.  History  Substance Use Topics  . Smoking status: Never Smoker   . Smokeless tobacco: Not on file  . Alcohol Use: Yes     ETOH abuse      Review of Systems  Unable to perform ROS: Other    Allergies  Review of patient's allergies indicates no known allergies.  Home Medications  No current outpatient prescriptions on file.  BP 143/120  Pulse 96  Ht 6\' 1"  (1.854 m)  Wt 130 lb (58.968 kg)  BMI 17.15 kg/m2  SpO2 95%  Physical Exam  Constitutional: He is oriented to person, place, and time. He appears well-developed and well-nourished.  HENT:  Head: Normocephalic and atraumatic.  Neck: Neck supple.  Cardiovascular: Normal rate.   Pulmonary/Chest: Effort normal and breath sounds normal. No respiratory distress. He has no wheezes. He has no rales.  Abdominal: Soft. He exhibits no distension and no mass. There is tenderness in the epigastric area. There is no rebound and no guarding.    Neurological: He is alert and oriented to person, place, and time.       Patient is able to state his full name, states it is the 11th.  Otherwise, patient becomes angered by questions and refuses to answer.      ED Course  Procedures (including critical care time)  Labs Reviewed  ETHANOL - Abnormal; Notable for the following:    Alcohol, Ethyl (B) 338 (*)    All other components within normal limits  SALICYLATE LEVEL - Abnormal; Notable for the following:    Salicylate Lvl <2.0 (*)    All other components within normal limits  POCT I-STAT, CHEM 8 - Abnormal; Notable for the following:    Sodium 152 (*)    Potassium 3.3 (*)    Calcium, Ion 1.05 (*)    All other components within normal limits  CBC - Abnormal; Notable for the following:    RBC 4.14 (*)    Hemoglobin 12.7 (*)    HCT 36.6 (*)    All other components within normal limits  ACETAMINOPHEN LEVEL  DIFFERENTIAL  LAB REPORT - SCANNED  I-STAT, CHEM 8   No results found.   Date: 04/09/2011  Rate: 90  Rhythm: normal sinus rhythm  QRS Axis: normal  Intervals: normal  ST/T Wave abnormalities: normal  Conduction Disutrbances:incomplete right bundle branch block  Narrative Interpretation:   Old EKG Reviewed: unchanged  2:46 PM Patient states he would like to go home.  I have asked him to attempt to contact someone for a safe ride home.  Patient denies SI, HI, intent to harm himself.  Declines any help with his alcoholism.   6:05 PM Patient sleeping soundly.   7:15 PM Patient now awake, alert, much clearer, eating dinner.  Plan is for patient to ambulate following meal.  Clinically he is much more lucid and sober.  If able to ambulate normally, patient to be d/c home.    7:44 PM Patient has eaten and is able to ambulate, is speaking clearly.  Is ready to be d/c home.    1. Alcohol abuse with intoxication       MDM  Patient is an alcoholic who has had multiple recent visits for same.  Patient sent to ER by police  request due to heavy intoxication in a public place.  Patient denied SI/HI while clinically sober - became clinically sober during shift - able to eat, drink, ambulate normally, speak clearly, requested discharge.  Declines all assistance or offers for help with detox.          Dillard Cannon [place], [place] 04/10/11 [phone]

## 2011-04-09 NOTE — ED Notes (Signed)
Made attempts x 2 to reach friends, left messages and no return call.

## 2011-04-09 NOTE — ED Notes (Signed)
Patient up to the BR several times w/o difficulty

## 2011-04-09 NOTE — ED Notes (Signed)
Pt ambulates in hallway w/o assistance, MD made aware.

## 2011-04-09 NOTE — Discharge Instructions (Signed)
Please consider getting help with your alcohol abuse.  Call [facility], listed above, if you would like help with detox.  Read the information below.  You may return to the ER at any time for worsening condition or any new symptoms that concern you.   Chronic Alcoholism Alcoholism is an addiction to alcohol. Addiction is a medical illness. It is not an one-time incident of heavy drinking that defines the disease of alcoholism.  The characteristics of addictive disease, such as alcoholism, include behaviors that the person finds pleasurable, at least initially. In alcohol addiction, drinking causes chemical changes in brain activity. This can lead to frequent cravings for alcohol. Unfortunately over time, an increased amount of alcohol is needed to produce the pleasure (tolerance). As a result, the person will start to feel uncomfortable symptoms when he or she is not drinking (withdrawal). Over time, a bad cycle develops. When painful withdrawal symptoms start to appear, alcohol is needed to make the symptoms go away. During this process, the person addicted to alcohol may become so used to the effects of alcohol that the usual signs of intoxication such as slurred speech, a staggering walk, or sleepiness may no longer be shown. Many people addicted to alcohol are able to function and even complete tasks. CAUSES   Drinking heavily and frequently.   Other factors like genetics.  SYMPTOMS   Headaches.   Frequent trouble falling or staying asleep (insomnia).   Irritability.   Uncontrolled shaking or movement (tremors).   Forgetting events (brownouts) or passing out (blackouts).   Seizures or hallucinations (delirium tremens).   Problems at work or at home that are related to drinking.   Medical problems related to drinking such as heart disease, stroke, high blood pressure, diabetes, stomach ulcers, bleeding from the GI tract, and liver failure.   Trauma (falls, broken bones, automobile  crashes).  TREATMENT  Alcoholism usually gets worse over time and almost never gets better without treatment. Your caregiver can help recommend a course of treatment for you depending on how severe your symptoms are and the level of your alcohol abuse. In some patients, stopping alcohol use or even decreasing use can bring about withdrawal symptoms that are dangerous or even deadly. For this reason, hospitalization is sometimes required to medically stabilize a patient.  If hospitalization is not required, but the risk of withdrawal is high, a detoxification (detox) facility may be recommended as an initial treatment step. In a detox center, medications can be given to protect against seizures and other withdrawal symptoms. Rehabilitation treatment may also be necessary. This is the process of treating the psychological and lifestyle element of addiction. There is both inpatient and outpatient rehabilitation treatment. Inpatient programs help patients through a systematic plan of psychological questioning, both individually and in groups, and at times using a "twelve-step" format. Outpatient rehabilitation programs have a similar structure and are aimed at promoting continued sobriety and preventing relapse into addiction. Make treatment decisions together with your caregiver. HOME CARE INSTRUCTIONS   If you are concerned about your alcohol use, talk to someone who can help. Trying to quit on your own is not easy. It can even be medically dangerous. This is especially true if you have been drinking heavily for a Hopping time.   Call your caregiver, Alcoholics Anonymous, or other alcoholic treatment programs for help.   AL-ANON and ALA-TEEN are support groups for friends and family members of an alcohol or drug dependent person. These people also often need help too. For  information about these organizations, check your phone directory or the internet. You can also call a local alcohol or chemical dependency  treatment center.  Document Released: 04/15/2004 Document Revised: 11/18/2010 Document Reviewed: 08/22/2009 [facility] Patient Information 2012 [place], [place].Alcohol Intoxication You have alcohol intoxication when the amount of alcohol that you have consumed has impaired your ability to mentally and physically function. There are a variety of factors that contribute to the level at which alcohol intoxication can occur, such as age, gender, weight, frequency of alcohol consumption, medication use, and the presence of other medical conditions, such as diabetes, seizures, or heart conditions. The blood alcohol level test measures the concentration of alcohol in your blood. In most states, your blood alcohol level must be lower than 80 mg/dL (1.61%) to legally drive. However, many dangerous effects of alcohol can occur at much lower levels. Alcohol directly impairs the normal chemical activity of the brain and is said to be a chemical depressant. Alcohol can cause drowsiness, stupor, respiratory failure, and coma. Other physical effects can include headache, vomiting, vomiting of blood, abdominal pain, a fast heartbeat, difficulty breathing, anxiety, and amnesia. Alcohol intoxication can also lead to dangerous and life-threatening activities, such as fighting, dangerous operation of vehicles or heavy machinery, and risky sexual behavior. Alcohol can be especially dangerous when taken with other drugs. Some of these drugs are:  Sedatives.   Painkillers.   Marijuana.   Tranquilizers.   Antihistamines.   Muscle relaxants.   Seizure medicine.  Many of the effects of acute alcohol intoxication are temporary. However, repeated alcohol intoxication can lead to severe medical illnesses. If you have alcohol intoxication, you should:  Stay hydrated. Drink enough water and fluids to keep your urine clear or pale yellow. Avoid excessive caffeine because this can further lead to dehydration.   Eat a healthy  diet. You may have residual nausea, headache, and loss of appetite, but it is still important that you maintain good nutrition. You can start with clear liquids.   Take nonsteroidal anti-inflammatory medications as needed for headaches, but make sure to do so with small meals. You should avoid acetaminophen for several days after having alcohol intoxication because the combination of alcohol and acetaminophen can be toxic to your liver.  If you have frequent alcohol intoxication, ask your friends and family if they think you have a drinking problem. For further help, contact:  Your caregiver.   Alcoholics Anonymous (AA).   A drug or alcohol rehabilitation program.  SEEK MEDICAL CARE IF:   You have persistent vomiting.   You have persistent pain in any part of your body.   You do not feel better after a few days.  SEEK IMMEDIATE MEDICAL CARE IF:   You become shaky or tremble when you try to stop drinking.   You shake uncontrollably (seizure).   You throw up (vomit) blood. This may be bright red or it may look like black coffee grounds.   You have blood in the stool. This may be bright red or appear as a black, tarry, bad smelling stool.   You become lightheaded or faint.  ANY OF THESE SYMPTOMS MAY REPRESENT A SERIOUS PROBLEM THAT IS AN EMERGENCY. Do not wait to see if the symptoms will go away. Get medical help right away. Call your local emergency services (911 in [place] DO NOT drive yourself to the hospital. MAKE SURE YOU:   Understand these instructions.   Will watch your condition.   Will get help right away if  you are not doing well or get worse.  Document Released: 12/16/2004 Document Revised: 11/18/2010 Document Reviewed: 08/25/2009 [facility] Patient Information 2012 [place], [place].

## 2011-04-10 ENCOUNTER — Emergency Department (HOSPITAL_COMMUNITY)
Admission: EM | Admit: 2011-04-10 | Discharge: 2011-04-10 | Disposition: A | Discharge status: Home / Self Care | Payer: Self-pay | Attending: Emergency Medicine | Admitting: Emergency Medicine

## 2011-04-10 ENCOUNTER — Ambulatory Visit (HOSPITAL_COMMUNITY): Admission: RE | Admit: 2011-04-10 | Payer: Self-pay | Source: Ambulatory Visit

## 2011-04-10 ENCOUNTER — Encounter (HOSPITAL_COMMUNITY): Payer: Self-pay | Admitting: Emergency Medicine

## 2011-04-10 ENCOUNTER — Encounter (HOSPITAL_COMMUNITY): Payer: Self-pay | Admitting: *Deleted

## 2011-04-10 ENCOUNTER — Emergency Department (HOSPITAL_COMMUNITY): Payer: Self-pay

## 2011-04-10 DIAGNOSIS — F1010 Alcohol abuse, uncomplicated: Secondary | ICD-10-CM | POA: Insufficient documentation

## 2011-04-10 DIAGNOSIS — K859 Acute pancreatitis without necrosis or infection, unspecified: Secondary | ICD-10-CM | POA: Insufficient documentation

## 2011-04-10 DIAGNOSIS — IMO0002 Reserved for concepts with insufficient information to code with codable children: Secondary | ICD-10-CM

## 2011-04-10 DIAGNOSIS — F319 Bipolar disorder, unspecified: Secondary | ICD-10-CM | POA: Insufficient documentation

## 2011-04-10 DIAGNOSIS — F10929 Alcohol use, unspecified with intoxication, unspecified: Secondary | ICD-10-CM

## 2011-04-10 DIAGNOSIS — R69 Illness, unspecified: Secondary | ICD-10-CM | POA: Insufficient documentation

## 2011-04-10 HISTORY — DX: Alcohol abuse, uncomplicated: F10.10

## 2011-04-10 LAB — COMPREHENSIVE METABOLIC PANEL
ALT: 17 U/L (ref 0–53)
Calcium: 8.3 mg/dL — ABNORMAL LOW (ref 8.4–10.5)
GFR calc Af Amer: >90 mL/min (ref >90)
Glucose, Bld: 109 mg/dL — ABNORMAL HIGH (ref 70–99)
Sodium: 151 mEq/L — ABNORMAL HIGH (ref 135–145)
Total Protein: 7.5 g/dL (ref 6.0–8.3)

## 2011-04-10 LAB — ETHANOL
Alcohol, Ethyl (B): 431 mg/dL (ref 0–11)
Alcohol, Ethyl (B): 215 mg/dL — ABNORMAL HIGH (ref 0–11)

## 2011-04-10 LAB — RAPID URINE DRUG SCREEN, HOSP PERFORMED
Amphetamines: NOT DETECTED
Benzodiazepines: NOT DETECTED
Cocaine: NOT DETECTED
Cocaine: NOT DETECTED
Opiates: NOT DETECTED
Opiates: NOT DETECTED

## 2011-04-10 LAB — CBC
MCH: 30.9 pg (ref 26.0–34.0)
MCHC: 34.7 g/dL (ref 30.0–36.0)
MCV: 89 fL (ref 78.0–100.0)
Platelets: 290 10*3/uL (ref 150–400)
RBC: 4.01 MIL/uL — ABNORMAL LOW (ref 4.22–5.81)
RDW: 14.5 % (ref 11.5–15.5)

## 2011-04-10 LAB — CK: Total CK: 165 U/L (ref 7–232)

## 2011-04-10 LAB — DIFFERENTIAL
Eosinophils Absolute: 0.1 10*3/uL (ref 0.0–0.7)
Eosinophils Relative: 1 % (ref 0–5)
Lymphs Abs: 2.7 10*3/uL (ref 0.7–4.0)

## 2011-04-10 LAB — POCT I-STAT, CHEM 8
BUN: 7 mg/dL (ref 6–23)
Calcium, Ion: 1.05 mmol/L — ABNORMAL LOW (ref 1.12–1.32)
Chloride: 112 mEq/L (ref 96–112)
Creatinine, Ser: 1.1 mg/dL (ref 0.50–1.35)

## 2011-04-10 LAB — LIPASE, BLOOD: Lipase: 234 U/L — ABNORMAL HIGH (ref 11–59)

## 2011-04-10 MED ORDER — SODIUM CHLORIDE 0.9 % IV SOLN
INTRAVENOUS | Status: DC
  Administered 2011-04-10: 13:00:00 via INTRAVENOUS

## 2011-04-10 MED ORDER — SODIUM CHLORIDE 0.9 % IV SOLN: INTRAVENOUS | Status: DC

## 2011-04-10 MED ORDER — SODIUM CHLORIDE 0.9 % IV BOLUS (SEPSIS)
700.0000 mL | Freq: Once | INTRAVENOUS | Status: AC
  Administered 2011-04-10: 700 mL via INTRAVENOUS

## 2011-04-10 MED ORDER — SODIUM CHLORIDE 0.9 % IV BOLUS (SEPSIS)
500.0000 mL | Freq: Once | INTRAVENOUS | Status: AC
  Administered 2011-04-10: 500 mL via INTRAVENOUS

## 2011-04-10 NOTE — ED Notes (Signed)
Pt complaining of pain at first iv site pt's iv dc'd and restarted. Pt with swelling noted at first iv site.

## 2011-04-10 NOTE — ED Provider Notes (Signed)
Patient signed out to me by Dr. Anitra Lauth. Patient has been examined and is stable for discharge. She has a stable gait, no slurred speech, and appears to be clinically sober at this time  Toy Baker, MD 04/10/11 [phone]

## 2011-04-10 NOTE — ED Provider Notes (Addendum)
Patient's alcohol level is decreased to 200. He is awake and alert. He states he no longer wants detox and wants to be discharged.  Results for orders placed during the hospital encounter of 04/10/11  ETHANOL      Component Value Range   Alcohol, Ethyl (B) 431 (*) 0 - 11 (mg/dL)  URINE RAPID DRUG SCREEN (HOSP PERFORMED)      Component Value Range   Opiates NONE DETECTED  NONE DETECTED    Cocaine NONE DETECTED  NONE DETECTED    Benzodiazepines NONE DETECTED  NONE DETECTED    Amphetamines NONE DETECTED  NONE DETECTED    Tetrahydrocannabinol NONE DETECTED  NONE DETECTED    Barbiturates POSITIVE (*) NONE DETECTED   CK      Component Value Range   Total CK 165  7 - 232 (U/L)  POCT I-STAT, CHEM 8      Component Value Range   Sodium 152 (*) 135 - 145 (mEq/L)   Potassium 3.6  3.5 - 5.1 (mEq/L)   Chloride 112  96 - 112 (mEq/L)   BUN 7  6 - 23 (mg/dL)   Creatinine, Ser 1.61  0.50 - 1.35 (mg/dL)   Glucose, Bld 096 (*) 70 - 99 (mg/dL)   Calcium, Ion 0.45 (*) 1.12 - 1.32 (mmol/L)   TCO2 26  0 - 100 (mmol/L)   Hemoglobin 12.2 (*) 13.0 - 17.0 (g/dL)   HCT 40.9 (*) 81.1 - 52.0 (%)  ETHANOL      Component Value Range   Alcohol, Ethyl (B) 215 (*) 0 - 11 (mg/dL)     Patient received normal saline 1 L ear and has been taking by mouth without difficulty.  Hilario Quarry, MD 04/10/11 [id number]  Hilario Quarry, MD 04/10/11 8295  Hilario Quarry, MD 04/10/11 2224

## 2011-04-10 NOTE — ED Notes (Signed)
Pt presenting to ed with c/o positve etoh pt is alert and with positive slurred speech at this time. Pt admits to drinking etoh.

## 2011-04-10 NOTE — ED Notes (Signed)
Pt transported to CT and Xray. 

## 2011-04-10 NOTE — ED Provider Notes (Signed)
History     CSN: [id number]  Arrival date & time 04/10/11  1191   First MD Initiated Contact with Patient 04/10/11 0037      No chief complaint on file.   (Consider location/radiation/quality/duration/timing/severity/associated sxs/prior treatment) HPI Comments: The patient found [facility] [place] of the [employer] intoxicated and passed out. EMS states they transported him several times in the last 2 days for the same.  The history is provided by the EMS personnel. The history is limited by the condition of the patient.    Past Medical History  Diagnosis Date  . Back pain   . Neck pain   . Bipolar 1 disorder     No past surgical history on file.  No family history on file.  History  Substance Use Topics  . Smoking status: Never Smoker   . Smokeless tobacco: Not on file  . Alcohol Use: Yes     ETOH abuse      Review of Systems  Unable to perform ROS   Allergies  Hydrocodone  Home Medications  No current outpatient prescriptions on file.  BP 152/82  Pulse 100  Temp(Src) 98.1 F (36.7 C) (Oral)  Resp 18  SpO2 95%  Physical Exam  Nursing note and vitals reviewed. Constitutional: He is oriented to person, place, and time. He appears well-developed and well-nourished. No distress.       Smells of EtOH  HENT:  Head: Normocephalic and atraumatic.    Mouth/Throat: Oropharynx is clear and moist.  Eyes: Conjunctivae and EOM are normal. Pupils are equal, round, and reactive to light.  Neck: Normal range of motion. Neck supple. No spinous process tenderness and no muscular tenderness present.  Cardiovascular: Normal rate, regular rhythm and intact distal pulses.   No murmur heard. Pulmonary/Chest: Effort normal and breath sounds normal. No respiratory distress. He has no wheezes. He has no rales.  Abdominal: Soft. He exhibits no distension. There is no tenderness. There is no rebound and no guarding.  Musculoskeletal: Normal range of motion. He exhibits no  edema and no tenderness.  Neurological: He is alert and oriented to person, place, and time.  Skin: Skin is warm and dry. No rash noted. No erythema.  Psychiatric: He has a normal mood and affect. His behavior is normal.    ED Course  Procedures (including critical care time)  Labs Reviewed  COMPREHENSIVE METABOLIC PANEL - Abnormal; Notable for the following:    Sodium 151 (*)    Potassium 3.4 (*)    Chloride 113 (*)    Glucose, Bld 109 (*)    Calcium 8.3 (*)    Total Bilirubin 0.2 (*)    All other components within normal limits  LIPASE, BLOOD - Abnormal; Notable for the following:    Lipase 234 (*)    All other components within normal limits  ETHANOL - Abnormal; Notable for the following:    Alcohol, Ethyl (B) 393 (*)    All other components within normal limits  DIFFERENTIAL  CBC  URINE RAPID DRUG SCREEN (HOSP PERFORMED)   Dg Chest 2 View  04/10/2011  *RADIOLOGY REPORT*  Clinical Data: Fall, pain.  CHEST - 2 VIEW  Comparison: 04/05/2011.  Findings: There are low lung volumes with bibasilar atelectasis. Heart size is borderline.  Mild peribronchial thickening.  No effusions or acute bony abnormality.  IMPRESSION: Low lung volumes, bibasilar atelectasis.  Original Report Authenticated By: Cyndie Chime, M.D.   Ct Head Wo Contrast  04/10/2011  *RADIOLOGY REPORT*  Clinical Data:  Found down.  Bruising to left forehead.  CT HEAD WITHOUT CONTRAST CT CERVICAL SPINE WITHOUT CONTRAST  Technique:  Multidetector CT imaging of the head and cervical spine was performed following the standard protocol without intravenous contrast.  Multiplanar CT image reconstructions of the cervical spine were also generated.  Comparison:  04/07/2011.  CT HEAD  Findings: Mild chronic small vessel disease in the deep white matter. No acute intracranial abnormality.  Specifically, no hemorrhage, hydrocephalus, mass lesion, acute infarction, or significant intracranial injury.  No acute calvarial abnormality.   Rounded soft tissue in the maxillary sinuses bilaterally.  Mild ethmoid sinus disease.  Mastoids are clear.  IMPRESSION: No acute intracranial abnormality.  Chronic sinusitis.  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  Mild degenerative facet disease bilaterally.  Normal alignment. Prevertebral soft tissues are normal.  No fracture.  Bilateral neural foraminal narrowing at C4-5 and C6-7.  No fracture.  No epidural or paraspinal hematoma.  IMPRESSION: Spondylosis as above.  No acute findings.  Original Report Authenticated By: Cyndie Chime, M.D.   Ct Cervical Spine Wo Contrast  04/10/2011  *RADIOLOGY REPORT*  Clinical Data:  Found down.  Bruising to left forehead.  CT HEAD WITHOUT CONTRAST CT CERVICAL SPINE WITHOUT CONTRAST  Technique:  Multidetector CT imaging of the head and cervical spine was performed following the standard protocol without intravenous contrast.  Multiplanar CT image reconstructions of the cervical spine were also generated.  Comparison:  04/07/2011.  CT HEAD  Findings: Mild chronic small vessel disease in the deep white matter. No acute intracranial abnormality.  Specifically, no hemorrhage, hydrocephalus, mass lesion, acute infarction, or significant intracranial injury.  No acute calvarial abnormality.  Rounded soft tissue in the maxillary sinuses bilaterally.  Mild ethmoid sinus disease.  Mastoids are clear.  IMPRESSION: No acute intracranial abnormality.  Chronic sinusitis.  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  Mild degenerative facet disease bilaterally.  Normal alignment. Prevertebral soft tissues are normal.  No fracture.  Bilateral neural foraminal narrowing at C4-5 and C6-7.  No fracture.  No epidural or paraspinal hematoma.  IMPRESSION: Spondylosis as above.  No acute findings.  Original Report Authenticated By: Cyndie Chime, M.D.     No diagnosis found.    MDM    Patient found [place] of [place] [employer]. He has been seen on the 17th and 18th at [facility] for similar intoxication. Patient has a small abrasion over his left for head but otherwise has no other sign of injury. He appears to be intoxicated currently and is just saying the same phrases over and over again. On exam he does appear to have some abdominal pain no signs of trauma. CBC, CMP, lipase, UDS, EtOH, head and neck CT, chest x-ray pending.   1:58 AM Negative head and neck CT. Chest x-ray within normal limits. CBC within normal limits. CMP with hypernatremia and dehydration. Lipase elevated at 240 most likely from alcoholic pancreatitis. Ordered CT of the abdomen due to 2 significant tenderness and elevated lipase.  5:09 AM The patient is currently refusing CT.  5:45 AM Patient states he wants to leave however he is still too intoxicated to leave. Will make sure that he can tolerate orals before leaving and that he is sober enough to leave. He is homeless and this is most likely why he continues to become intoxicated and was brought to the  emergency room.  7:06 AM Patient checked out to Dr. Freida Busman at 7 AM  Gwyneth Sprout, MD 04/10/11 [phone]

## 2011-04-10 NOTE — Progress Notes (Signed)
I met pt shortly after arrival and asked if he wanted me to call family to let them know he is here. He said please don't hurt me and I told him we are here to help him. He repeated please don't hurt me several times. Staff and I continued to tell him we are here to help him. He called the name Romana Juniper several times and I asked if that was who he wanted me to call.  He said yes. He gave me two numbers, however the first number was incorrect and the second number would not take incoming calls. I was not able to reach pt contacts. Sarita Haver Ardis Rowan 04-10-11 2:47am

## 2011-04-10 NOTE — ED Notes (Signed)
Pt C-collar removed per EDP [place].

## 2011-04-10 NOTE — ED Notes (Signed)
Pt presenting to ed with c/o found by ptar in a driveway. Per ptar cbg-80 vss. Per ptar pt slept all the way here. Pt was here Friday and yesterday for the same.

## 2011-04-10 NOTE — ED Provider Notes (Signed)
History     CSN: [id number]  Arrival date & time 04/10/11  1158   First MD Initiated Contact with Patient 04/10/11 1202      Chief Complaint  Patient presents with  . Alcohol Intoxication   Level V caveat for apparent intoxication  (Consider location/radiation/quality/duration/timing/severity/associated sxs/prior treatment) HPI  Patient was in the ER twice yesterday for alcohol intoxication. Per EMS please were called and patient was found lying in somebody's driveway. Patient appears to be intoxicated. Any question I ask him he just states "Tonya" or "Don't hurt me". His speech is slurred. He does not appear to know where he is.  Past Medical History  Diagnosis Date  . Back pain   . Neck pain   . Bipolar 1 disorder   . ETOH abuse     History reviewed. No pertinent past surgical history.  History reviewed. No pertinent family history.  History  Substance Use Topics  . Smoking status: Never Smoker   . Smokeless tobacco: Not on file  . Alcohol Use: Yes     ETOH abuse  Unable to do    Review of Systems  Unable to perform ROS: Mental status change    Allergies  Hydrocodone  Home Medications  No current outpatient prescriptions on file.  BP 127/83  Pulse 93  Temp(Src) 98.2 F (36.8 C) (Oral)  Resp 16  SpO2 96% Vital signs normal    Physical Exam  Nursing note and vitals reviewed. Constitutional: He appears well-developed and well-nourished.  Non-toxic appearance. He does not appear ill. No distress.  HENT:  Head: Normocephalic and atraumatic.  Right Ear: External ear normal.  Left Ear: External ear normal.  Nose: Nose normal. No mucosal edema or rhinorrhea.  Mouth/Throat: Mucous membranes are normal. No dental abscesses or uvula swelling.       Mucous membranes are dry  Eyes: Conjunctivae and EOM are normal. Pupils are equal, round, and reactive to light.  Neck: Normal range of motion and full passive range of motion without pain. Neck supple.    Cardiovascular: Normal rate, regular rhythm and normal heart sounds.  Exam reveals no gallop and no friction rub.   No murmur heard. Pulmonary/Chest: Effort normal and breath sounds normal. No respiratory distress. He has no wheezes. He has no rhonchi. He has no rales. He exhibits no tenderness and no crepitus.  Abdominal: Soft. Normal appearance and bowel sounds are normal. He exhibits no distension. There is no tenderness. There is no rebound and no guarding.  Musculoskeletal: Normal range of motion. He exhibits no edema and no tenderness.       Moves all extremities well.   Neurological: He has normal strength. No cranial nerve deficit.       Speech is slow and thick  consistent with intoxication  Skin: Skin is warm, dry and intact. No rash noted. No erythema. No pallor.  Psychiatric: His speech is normal. His mood appears not anxious.       Affect is flat    ED Course  Procedures (including critical care time)  Pt given IV fluids.   14:05 patient recheck. Is sleeping he is easily will awaken. He is repeating "are you okay baby, are you okay baby?" Patient's speech is a little easier to understand. He was reoriented that he was in the emergency department. I asked him when he was going to quit drinking he states "never until I die" Then he becomes tearful.   1500 pt turned over to Dr Rosalia Hammers to  reevaluate patient once sober, may need ACT evaluation if patient wants to go to rehab  Results for orders placed during the hospital encounter of 04/10/11  ETHANOL      Component Value Range   Alcohol, Ethyl (B) 431 (*) 0 - 11 (mg/dL)  URINE RAPID DRUG SCREEN (HOSP PERFORMED)      Component Value Range   Opiates NONE DETECTED  NONE DETECTED    Cocaine NONE DETECTED  NONE DETECTED    Benzodiazepines NONE DETECTED  NONE DETECTED    Amphetamines NONE DETECTED  NONE DETECTED    Tetrahydrocannabinol NONE DETECTED  NONE DETECTED    Barbiturates POSITIVE (*) NONE DETECTED   CK      Component  Value Range   Total CK 165  7 - 232 (U/L)  POCT I-STAT, CHEM 8      Component Value Range   Sodium 152 (*) 135 - 145 (mEq/L)   Potassium 3.6  3.5 - 5.1 (mEq/L)   Chloride 112  96 - 112 (mEq/L)   BUN 7  6 - 23 (mg/dL)   Creatinine, Ser 1.61  0.50 - 1.35 (mg/dL)   Glucose, Bld 096 (*) 70 - 99 (mg/dL)   Calcium, Ion 0.45 (*) 1.12 - 1.32 (mmol/L)   TCO2 26  0 - 100 (mmol/L)   Hemoglobin 12.2 (*) 13.0 - 17.0 (g/dL)   HCT 40.9 (*) 81.1 - 52.0 (%)  ETHANOL      Component Value Range   Alcohol, Ethyl (B) 215 (*) 0 - 11 (mg/dL)   Laboratory interpretation all normal except for alcohol intoxication  Dg Chest 2 View  04/10/2011  *RADIOLOGY REPORT*  Clinical Data: Fall, pain.  CHEST - 2 VIEW  Comparison: 04/05/2011.  Findings: There are low lung volumes with bibasilar atelectasis. Heart size is borderline.  Mild peribronchial thickening.  No effusions or acute bony abnormality.  IMPRESSION: Low lung volumes, bibasilar atelectasis.  Original Report Authenticated By: Cyndie Chime, M.D.   Ct Head Wo Contrast  04/10/2011  *RADIOLOGY REPORT*  Clinical Data:  Found down.  Bruising to left forehead.  CT HEAD WITHOUT CONTRAST CT CERVICAL SPINE WITHOUT CONTRAST  Technique:  Multidetector CT imaging of the head and cervical spine was performed following the standard protocol without intravenous contrast.  Multiplanar CT image reconstructions of the cervical spine were also generated.  Comparison:  04/07/2011.  CT HEAD  Findings: Mild chronic small vessel disease in the deep white matter. No acute intracranial abnormality.  Specifically, no hemorrhage, hydrocephalus, mass lesion, acute infarction, or significant intracranial injury.  No acute calvarial abnormality.  Rounded soft tissue in the maxillary sinuses bilaterally.  Mild ethmoid sinus disease.  Mastoids are clear.  IMPRESSION: No acute intracranial abnormality.  Chronic sinusitis.  CT CERVICAL SPINE  Findings: Degenerative disc disease most pronounced at  C4-5 and C6- 7.  Mild degenerative facet disease bilaterally.  Normal alignment. Prevertebral soft tissues are normal.  No fracture.  Bilateral neural foraminal narrowing at C4-5 and C6-7.  No fracture.  No epidural or paraspinal hematoma.  IMPRESSION: Spondylosis as above.  No acute findings.  Original Report Authenticated By: Cyndie Chime, M.D.   Ct Head Wo Contrast  04/07/2011  *RADIOLOGY REPORT*  Clinical Data: Medical clearance; found in parking lot.  CT HEAD WITHOUT CONTRAST  Technique:  Contiguous axial images were obtained from the base of the skull through the vertex without contrast.  Comparison: CT of the head performed 04/05/2011  Findings: There is no evidence of acute  infarction, mass lesion, or intra- or extra-axial hemorrhage on CT.  The posterior fossa, including the cerebellum, brainstem and fourth ventricle, is within normal limits.  The third and lateral ventricles, and basal ganglia are unremarkable in appearance.  The cerebral hemispheres are symmetric in appearance, with normal gray- white differentiation.  No mass effect or midline shift is seen.  There is no evidence of fracture; visualized osseous structures are unremarkable in appearance.  The orbits are within normal limits. Mucosal thickening is noted within the left maxillary sinus; the remaining paranasal sinuses and mastoid air cells are well-aerated. Mild soft tissue swelling is noted overlying the right posterior parietal calvarium.  IMPRESSION:  1.  No acute intracranial pathology seen on CT. 2.  Mild soft tissue swelling noted overlying the right posterior parietal calvarium. 3.  Mucosal thickening within the left maxillary sinus.  Original Report Authenticated By: Tonia Ghent, M.D.   Ct Cervical Spine Wo Contrast  04/10/2011  *RADIOLOGY REPORT*  Clinical Data:  Found down.  Bruising to left forehead.  CT HEAD WITHOUT CONTRAST CT CERVICAL SPINE WITHOUT CONTRAST  Technique:  Multidetector CT imaging of the head and  cervical spine was performed following the standard protocol without intravenous contrast.  Multiplanar CT image reconstructions of the cervical spine were also generated.  Comparison:  04/07/2011.  CT HEAD  Findings: Mild chronic small vessel disease in the deep white matter. No acute intracranial abnormality.  Specifically, no hemorrhage, hydrocephalus, mass lesion, acute infarction, or significant intracranial injury.  No acute calvarial abnormality.  Rounded soft tissue in the maxillary sinuses bilaterally.  Mild ethmoid sinus disease.  Mastoids are clear.  IMPRESSION: No acute intracranial abnormality.  Chronic sinusitis.  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  Mild degenerative facet disease bilaterally.  Normal alignment. Prevertebral soft tissues are normal.  No fracture.  Bilateral neural foraminal narrowing at C4-5 and C6-7.  No fracture.  No epidural or paraspinal hematoma.  IMPRESSION: Spondylosis as above.  No acute findings.  Original Report Authenticated By: Cyndie Chime, M.D.      Diagnoses that have been ruled out:  None  Diagnoses that are still under consideration:  None  Final diagnoses:  Alcohol intoxication  Alcohol abuse   Plan per Dr Rosalia Hammers once patient is sober, may need ACT evaluation  Devoria Albe, MD, [facility]     MDM  Ward Givens, MD 04/11/11 [phone]

## 2011-04-10 NOTE — ED Notes (Signed)
Call to [facility] to establish pt temp. Shelter, pt informed, information circled and highlighted

## 2011-04-10 NOTE — ED Notes (Signed)
ZOX:[id number]<AV> Expected date:04/10/11<BR> Expected time:10:42 AM<BR> Means of arrival:Ambulance<BR> Comments:<BR> EMS overdose

## 2011-04-10 NOTE — ED Notes (Signed)
Per EMS: pt was seen at [facility] twice yesterday (due to two armbands being on pt.) Pt was found on sidewalk with ETOH on board. Pt was answering [employer] correctly when asked name or place but with EMS pt became more alerted and disoriented. Pt has a small hematoma to his forehead above his left eyebrow. Pt continues to repeat Tonya and 1,2,3. Pt fully immobolized and c-collar applied.

## 2011-04-10 NOTE — ED Notes (Signed)
Patient to eat breakfast and can then be discharged per EDP

## 2011-04-11 ENCOUNTER — Encounter (HOSPITAL_COMMUNITY): Payer: Self-pay | Admitting: *Deleted

## 2011-04-11 ENCOUNTER — Emergency Department (HOSPITAL_COMMUNITY): Payer: Self-pay

## 2011-04-11 ENCOUNTER — Emergency Department (HOSPITAL_COMMUNITY)
Admission: EM | Admit: 2011-04-11 | Discharge: 2011-04-12 | Disposition: A | Discharge status: Home / Self Care | Payer: Self-pay | Attending: Emergency Medicine | Admitting: Emergency Medicine

## 2011-04-11 DIAGNOSIS — E876 Hypokalemia: Secondary | ICD-10-CM | POA: Insufficient documentation

## 2011-04-11 DIAGNOSIS — F10929 Alcohol use, unspecified with intoxication, unspecified: Secondary | ICD-10-CM

## 2011-04-11 DIAGNOSIS — F1010 Alcohol abuse, uncomplicated: Secondary | ICD-10-CM | POA: Insufficient documentation

## 2011-04-11 DIAGNOSIS — R42 Dizziness and giddiness: Secondary | ICD-10-CM | POA: Insufficient documentation

## 2011-04-11 DIAGNOSIS — R269 Unspecified abnormalities of gait and mobility: Secondary | ICD-10-CM | POA: Insufficient documentation

## 2011-04-11 DIAGNOSIS — F319 Bipolar disorder, unspecified: Secondary | ICD-10-CM | POA: Insufficient documentation

## 2011-04-11 LAB — POCT I-STAT, CHEM 8
BUN: 5 mg/dL — ABNORMAL LOW (ref 6–23)
Chloride: 112 mEq/L (ref 96–112)
Glucose, Bld: 127 mg/dL — ABNORMAL HIGH (ref 70–99)
HCT: 40 % (ref 39.0–52.0)
Potassium: 3.2 mEq/L — ABNORMAL LOW (ref 3.5–5.1)

## 2011-04-11 MED ORDER — SODIUM CHLORIDE 0.9 % IV BOLUS (SEPSIS)
1000.0000 mL | Freq: Once | INTRAVENOUS | Status: AC
  Administered 2011-04-11: 1000 mL via INTRAVENOUS

## 2011-04-11 NOTE — ED Notes (Signed)
[id number]<AV> Expected date:04/11/11<BR> Expected time:11:32 AM<BR> Means of arrival:Ambulance<BR> Comments:<BR> EMS 80 GC- 51 y/o male ETOH. Vitals wnl.

## 2011-04-11 NOTE — ED Notes (Signed)
Pt here for alcohol intoxication, Bp 138/88, HR 95, sat 96% RA. No acute distress noted. Pt is awake and alert. States that he drank a lot; unable to provide a specific amount.

## 2011-04-11 NOTE — ED Notes (Signed)
EMS, By standers saw pt lying on sidewalk and called EMS, pt intoxicated

## 2011-04-11 NOTE — ED Notes (Signed)
Pt unable to ambulate,pt states that he is too dizzy

## 2011-04-11 NOTE — ED Notes (Signed)
Pt resting with eyes closed and equal chest rise and fall.

## 2011-04-11 NOTE — ED Notes (Signed)
Pt attempted to ambulated;was very unsteady and dizzy

## 2011-04-11 NOTE — ED Provider Notes (Signed)
History     CSN: [id number]  Arrival date & time 04/11/11  1131   First MD Initiated Contact with Patient 04/11/11 1215      Chief Complaint  Patient presents with  . Alcohol Intoxication    (Consider location/radiation/quality/duration/timing/severity/associated sxs/prior treatment) Patient is a 51 y.o. male presenting with intoxication. The history is provided by the EMS personnel.  Alcohol Intoxication This is a new problem. The current episode started today.  Pt brought in by EMS, found on the side walk in front of a business, unconscious. Pr smells of alcohol. Arousalble but somnolent. Hx of alcohol abuse.  Pt unable to prove any history.  Past Medical History  Diagnosis Date  . Back pain   . Neck pain   . Bipolar 1 disorder   . ETOH abuse     History reviewed. No pertinent past surgical history.  No family history on file.  History  Substance Use Topics  . Smoking status: Never Smoker   . Smokeless tobacco: Not on file  . Alcohol Use: Yes     ETOH abuse      Review of Systems  Unable to perform ROS   Allergies  Hydrocodone  Home Medications  No current outpatient prescriptions on file.  BP 138/88  Pulse 95  Temp(Src) 98.2 F (36.8 C) (Oral)  Resp 18  SpO2 95%  Physical Exam  Nursing note and vitals reviewed. Constitutional: He appears well-developed and well-nourished.       somnolent  HENT:  Head: Normocephalic and atraumatic.  Right Ear: External ear normal.  Left Ear: External ear normal.  Nose: Nose normal.  Eyes: Conjunctivae and EOM are normal. Pupils are equal, round, and reactive to light.  Neck: Normal range of motion. Neck supple.  Cardiovascular: Normal rate, regular rhythm and normal heart sounds.   Pulmonary/Chest: Effort normal and breath sounds normal. No respiratory distress.  Abdominal: Soft. Bowel sounds are normal. He exhibits no distension. There is no tenderness.  Musculoskeletal: Normal range of motion. He exhibits  no edema.  Neurological: He is alert. He has normal reflexes. No cranial nerve deficit.       Pt arousable to voice and stimuli only. Pt slurring his words, incomprehensible, falls back asleep within 20 seconds. Unable to follow directions.   Skin: Skin is warm and dry. No rash noted.    ED Course  Procedures (including critical care time)  Labs Reviewed - No data to display Dg Chest 2 View  04/10/2011  *RADIOLOGY REPORT*  Clinical Data: Fall, pain.  CHEST - 2 VIEW  Comparison: 04/05/2011.  Findings: There are low lung volumes with bibasilar atelectasis. Heart size is borderline.  Mild peribronchial thickening.  No effusions or acute bony abnormality.  IMPRESSION: Low lung volumes, bibasilar atelectasis.  Original Report Authenticated By: Cyndie Chime, M.D.   Ct Head Wo Contrast  04/10/2011  *RADIOLOGY REPORT*  Clinical Data:  Found down.  Bruising to left forehead.  CT HEAD WITHOUT CONTRAST CT CERVICAL SPINE WITHOUT CONTRAST  Technique:  Multidetector CT imaging of the head and cervical spine was performed following the standard protocol without intravenous contrast.  Multiplanar CT image reconstructions of the cervical spine were also generated.  Comparison:  04/07/2011.  CT HEAD  Findings: Mild chronic small vessel disease in the deep white matter. No acute intracranial abnormality.  Specifically, no hemorrhage, hydrocephalus, mass lesion, acute infarction, or significant intracranial injury.  No acute calvarial abnormality.  Rounded soft tissue in the maxillary sinuses bilaterally.  Mild ethmoid sinus disease.  Mastoids are clear.  IMPRESSION: No acute intracranial abnormality.  Chronic sinusitis.  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  Mild degenerative facet disease bilaterally.  Normal alignment. Prevertebral soft tissues are normal.  No fracture.  Bilateral neural foraminal narrowing at C4-5 and C6-7.  No fracture.  No epidural or paraspinal  hematoma.  IMPRESSION: Spondylosis as above.  No acute findings.  Original Report Authenticated By: Cyndie Chime, M.D.   Ct Cervical Spine Wo Contrast  04/10/2011  *RADIOLOGY REPORT*  Clinical Data:  Found down.  Bruising to left forehead.  CT HEAD WITHOUT CONTRAST CT CERVICAL SPINE WITHOUT CONTRAST  Technique:  Multidetector CT imaging of the head and cervical spine was performed following the standard protocol without intravenous contrast.  Multiplanar CT image reconstructions of the cervical spine were also generated.  Comparison:  04/07/2011.  CT HEAD  Findings: Mild chronic small vessel disease in the deep white matter. No acute intracranial abnormality.  Specifically, no hemorrhage, hydrocephalus, mass lesion, acute infarction, or significant intracranial injury.  No acute calvarial abnormality.  Rounded soft tissue in the maxillary sinuses bilaterally.  Mild ethmoid sinus disease.  Mastoids are clear.  IMPRESSION: No acute intracranial abnormality.  Chronic sinusitis.  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  Mild degenerative facet disease bilaterally.  Normal alignment. Prevertebral soft tissues are normal.  No fracture.  Bilateral neural foraminal narrowing at C4-5 and C6-7.  No fracture.  No epidural or paraspinal hematoma.  IMPRESSION: Spondylosis as above.  No acute findings.  Original Report Authenticated By: Cyndie Chime, M.D.     No diagnosis found.  7:53 PM Pt has been rechecked several times. He is now more awake, alert. VS normal. WIll recheck, d/c when able to ambulate.   8:33 PM Tried to ambulate pt, pt not awake enough to ambulate,appears very somnolent.  MDM          Lottie Mussel, PA 04/12/11 1133

## 2011-04-12 MED ORDER — POTASSIUM CHLORIDE CRYS ER 20 MEQ PO TBCR
20.0000 meq | EXTENDED_RELEASE_TABLET | Freq: Once | ORAL | Status: AC
  Administered 2011-04-12: 20 meq via ORAL
  Filled 2011-04-12: qty 1

## 2011-04-12 MED ORDER — AMMONIA AROMATIC IN INHA
RESPIRATORY_TRACT | Status: AC
  Filled 2011-04-12: qty 10

## 2011-04-12 NOTE — ED Provider Notes (Signed)
Medical screening examination/treatment/procedure(s) were performed by non-physician practitioner and as supervising physician I was immediately available for consultation/collaboration.  Shelbe Haglund T Saifullah Jolley, MD 04/12/11 1543 

## 2011-04-12 NOTE — ED Notes (Signed)
Pt understands the need for treatment to address ETOH

## 2011-04-12 NOTE — ED Provider Notes (Signed)
0100:  Pt arousable, tolerating PO fluids. Reports he still feels very dizzy. Will continue to observe and re-eval.  0400:  Pt now awake, tolerating PO fluids and has eaten a meal. Will get pt up to walk and plan for d/c if pt able to ambulate.  0450:  Pt has ambulated w/o difficulty. Given PO potassium for mild hypokalemia. Pt has contacted his girlfriend who has agreed to come get pt and transport home. Will plan to d/c home w/ substance abuse referrals. Pt agreeable w/ plan.   Roma Kayser Tashay Bozich, NP 04/12/11 [phone]

## 2011-04-13 ENCOUNTER — Encounter (HOSPITAL_COMMUNITY): Payer: Self-pay | Admitting: Emergency Medicine

## 2011-04-13 ENCOUNTER — Emergency Department (HOSPITAL_COMMUNITY)
Admission: EM | Admit: 2011-04-13 | Discharge: 2011-04-13 | Disposition: A | Discharge status: Home / Self Care | Payer: Self-pay | Attending: Emergency Medicine | Admitting: Emergency Medicine

## 2011-04-13 DIAGNOSIS — F1020 Alcohol dependence, uncomplicated: Secondary | ICD-10-CM | POA: Insufficient documentation

## 2011-04-13 DIAGNOSIS — R4789 Other speech disturbances: Secondary | ICD-10-CM | POA: Insufficient documentation

## 2011-04-13 LAB — CBC
Hemoglobin: 10.5 g/dL — ABNORMAL LOW (ref 13.0–17.0)
MCH: 29.2 pg (ref 26.0–34.0)
Platelets: 183 10*3/uL (ref 150–400)
RBC: 3.6 MIL/uL — ABNORMAL LOW (ref 4.22–5.81)
WBC: 6.6 10*3/uL (ref 4.0–10.5)

## 2011-04-13 LAB — RAPID URINE DRUG SCREEN, HOSP PERFORMED
Barbiturates: NOT DETECTED
Cocaine: NOT DETECTED
Tetrahydrocannabinol: NOT DETECTED

## 2011-04-13 LAB — COMPREHENSIVE METABOLIC PANEL
ALT: 17 U/L (ref 0–53)
AST: 28 U/L (ref 0–37)
Alkaline Phosphatase: 85 U/L (ref 39–117)
CO2: 23 mEq/L (ref 19–32)
Chloride: 110 mEq/L (ref 96–112)
GFR calc Af Amer: >90 mL/min (ref >90)
GFR calc non Af Amer: >90 mL/min (ref >90)
Glucose, Bld: 96 mg/dL (ref 70–99)
Potassium: 3.6 mEq/L (ref 3.5–5.1)
Sodium: 144 mEq/L (ref 135–145)
Total Bilirubin: 0.2 mg/dL — ABNORMAL LOW (ref 0.3–1.2)

## 2011-04-13 NOTE — ED Notes (Signed)
MD at bedside. 

## 2011-04-13 NOTE — ED Provider Notes (Addendum)
History     CSN: [id number]  Arrival date & time 04/13/11  1408   First MD Initiated Contact with Patient 04/13/11 1507      Chief Complaint  Patient presents with  . Alcohol Intoxication    (Consider location/radiation/quality/duration/timing/severity/associated sxs/prior treatment) HPI... patient has Foglio-standing problem with alcoholism. He was found by the police department in front of [employer] intoxicated.  He specifically says he does not want help to stop drinking. I saw him approximately one week ago in the ER for similar symptoms.  No neuro deficits, fever, chills, stiff neck.  Nothing makes symptoms better or worse  Past Medical History  Diagnosis Date  . Back pain   . Neck pain   . Bipolar 1 disorder   . ETOH abuse     History reviewed. No pertinent past surgical history.  No family history on file.  History  Substance Use Topics  . Smoking status: Never Smoker   . Smokeless tobacco: Not on file  . Alcohol Use: Yes     ETOH abuse      Review of Systems  All other systems reviewed and are negative.    Allergies  Hydrocodone  Home Medications  No current outpatient prescriptions on file.  BP 133/93  Pulse 98  Temp(Src) 98.2 F (36.8 C) (Oral)  Resp 18  SpO2 92%  Physical Exam  Nursing note and vitals reviewed. Constitutional: He is oriented to person, place, and time. He appears well-developed and well-nourished.       Patient is alert, slightly slurring his words  HENT:  Head: Normocephalic and atraumatic.  Eyes: Conjunctivae and EOM are normal. Pupils are equal, round, and reactive to light.  Neck: Normal range of motion. Neck supple.  Cardiovascular: Normal rate and regular rhythm.   Pulmonary/Chest: Effort normal and breath sounds normal.  Abdominal: Soft. Bowel sounds are normal.  Musculoskeletal: Normal range of motion.  Neurological: He is alert and oriented to person, place, and time.  Skin: Skin is warm and dry.  Psychiatric: He  has a normal mood and affect.    ED Course  Procedures (including critical care time)  Labs Reviewed  CBC - Abnormal; Notable for the following:    RBC 3.60 (*)    Hemoglobin 10.5 (*)    HCT 32.1 (*)    All other components within normal limits  COMPREHENSIVE METABOLIC PANEL - Abnormal; Notable for the following:    Calcium 8.3 (*)    Albumin 3.2 (*)    Total Bilirubin 0.2 (*)    All other components within normal limits  ETHANOL - Abnormal; Notable for the following:    Alcohol, Ethyl (B) 388 (*)    All other components within normal limits  URINE RAPID DRUG SCREEN (HOSP PERFORMED)   No results found.   No diagnosis found.    MDM  Patient is pleasant, no neuro deficits. We'll do screening labs. Probable discharge  Recheck at 1700:  No neuro deficits.  Patient wants to go home. Has ride.  No evidence of head injury.      Donnetta Hutching, MD 04/13/11 1636  Donnetta Hutching, MD 04/13/11 [phone]

## 2011-04-13 NOTE — ED Notes (Signed)
Patient given belongings and told to wait for discharge papers

## 2011-04-13 NOTE — ED Notes (Signed)
Belongings consist of 2 belonging bags and one brown coat. Coat has state id in one pocket and phone charger in inside pocket. Patient has blue jeans, blue hooded sweater, blue t-shirt, black belt, tennis shoes, grey socks, and cell phone. Patient has the amount of $1.31 in a tied glove. Patient changed into blue scrubs and red socks. Security called to search and wand. Patient belonging locked in patient rooms, exam 7 under linen cabinet.

## 2011-04-13 NOTE — ED Notes (Signed)
Per EMS, call out by [employer], pt lying on sidewalk in front of [employer]; pt reports having 6 - 16 oz bottles of 17% cooking wine since 0900 today. Pt a&o x 4, cooperative, slurring speech. Denies complaint.

## 2011-04-13 NOTE — ED Notes (Signed)
Pt states that his ride is on the way and he is ready to go home once his ride gets here. Dr. Adriana Simas notified. Pt to be d/c home.

## 2011-04-13 NOTE — ED Notes (Signed)
[id number]<JY> Expected date:04/13/11<BR> Expected time: 1:59 PM<BR> Means of arrival:Ambulance<BR> Comments:<BR> EMS GC - intoxicated subject

## 2011-04-14 ENCOUNTER — Emergency Department (HOSPITAL_COMMUNITY)
Admission: EM | Admit: 2011-04-14 | Discharge: 2011-04-14 | Disposition: A | Discharge status: Home / Self Care | Payer: Self-pay | Attending: Emergency Medicine | Admitting: Emergency Medicine

## 2011-04-14 DIAGNOSIS — R112 Nausea with vomiting, unspecified: Secondary | ICD-10-CM | POA: Insufficient documentation

## 2011-04-14 DIAGNOSIS — F319 Bipolar disorder, unspecified: Secondary | ICD-10-CM | POA: Insufficient documentation

## 2011-04-14 DIAGNOSIS — E876 Hypokalemia: Secondary | ICD-10-CM | POA: Insufficient documentation

## 2011-04-14 DIAGNOSIS — K859 Acute pancreatitis without necrosis or infection, unspecified: Secondary | ICD-10-CM | POA: Insufficient documentation

## 2011-04-14 DIAGNOSIS — F1010 Alcohol abuse, uncomplicated: Secondary | ICD-10-CM | POA: Insufficient documentation

## 2011-04-14 LAB — COMPREHENSIVE METABOLIC PANEL
AST: 31 U/L (ref 0–37)
Albumin: 3.7 g/dL (ref 3.5–5.2)
Alkaline Phosphatase: 93 U/L (ref 39–117)
BUN: 7 mg/dL (ref 6–23)
CO2: 26 mEq/L (ref 19–32)
Chloride: 109 mEq/L (ref 96–112)
GFR calc non Af Amer: >90 mL/min (ref >90)
Potassium: 3.3 mEq/L — ABNORMAL LOW (ref 3.5–5.1)
Total Bilirubin: 0.3 mg/dL (ref 0.3–1.2)

## 2011-04-14 LAB — URINALYSIS, ROUTINE W REFLEX MICROSCOPIC
Bilirubin Urine: NEGATIVE
Glucose, UA: NEGATIVE mg/dL
Hgb urine dipstick: NEGATIVE
Ketones, ur: NEGATIVE mg/dL
Protein, ur: NEGATIVE mg/dL
pH: 5.5 (ref 5.0–8.0)

## 2011-04-14 LAB — RAPID URINE DRUG SCREEN, HOSP PERFORMED
Amphetamines: NOT DETECTED
Barbiturates: NOT DETECTED
Benzodiazepines: NOT DETECTED
Cocaine: NOT DETECTED
Tetrahydrocannabinol: NOT DETECTED

## 2011-04-14 LAB — DIFFERENTIAL
Basophils Absolute: 0 10*3/uL (ref 0.0–0.1)
Basophils Relative: 0 % (ref 0–1)
Lymphocytes Relative: 26 % (ref 12–46)
Monocytes Absolute: 0.3 10*3/uL (ref 0.1–1.0)
Monocytes Relative: 4 % (ref 3–12)
Neutro Abs: 5.2 10*3/uL (ref 1.7–7.7)
Neutrophils Relative %: 68 % (ref 43–77)

## 2011-04-14 LAB — CBC
HCT: 34.2 % — ABNORMAL LOW (ref 39.0–52.0)
Hemoglobin: 12.1 g/dL — ABNORMAL LOW (ref 13.0–17.0)
MCHC: 35.4 g/dL (ref 30.0–36.0)
WBC: 7.7 10*3/uL (ref 4.0–10.5)

## 2011-04-14 MED ORDER — FOLIC ACID 1 MG PO TABS
1.0000 mg | ORAL_TABLET | Freq: Once | ORAL | Status: AC
  Administered 2011-04-14: 1 mg via ORAL
  Filled 2011-04-14: qty 1

## 2011-04-14 MED ORDER — THIAMINE HCL 100 MG/ML IJ SOLN
100.0000 mg | Freq: Once | INTRAMUSCULAR | Status: AC
  Administered 2011-04-14: 100 mg via INTRAVENOUS
  Filled 2011-04-14: qty 2

## 2011-04-14 MED ORDER — SODIUM CHLORIDE 0.9 % IV BOLUS (SEPSIS)
1000.0000 mL | Freq: Once | INTRAVENOUS | Status: AC
  Administered 2011-04-14: 1000 mL via INTRAVENOUS

## 2011-04-14 MED ORDER — THIAMINE HCL 100 MG/ML IJ SOLN
INTRAVENOUS | Status: DC
  Filled 2011-04-14: qty 1000

## 2011-04-14 MED ORDER — ONDANSETRON HCL 4 MG/2ML IJ SOLN
4.0000 mg | Freq: Once | INTRAMUSCULAR | Status: AC
  Administered 2011-04-14: 4 mg via INTRAVENOUS
  Filled 2011-04-14: qty 2

## 2011-04-14 MED ORDER — POTASSIUM CHLORIDE 10 MEQ/100ML IV SOLN
10.0000 meq | INTRAVENOUS | Status: AC
  Administered 2011-04-14 (×2): 10 meq via INTRAVENOUS
  Filled 2011-04-14 (×2): qty 100

## 2011-04-14 NOTE — ED Notes (Signed)
KGM:[id number]<VO> Expected date:04/14/11<BR> Expected time:10:24 AM<BR> Means of arrival:Ambulance<BR> Comments:<BR> Pancreatitis

## 2011-04-14 NOTE — ED Provider Notes (Signed)
Medical screening examination/treatment/procedure(s) were performed by non-physician practitioner and as supervising physician I was immediately available for consultation/collaboration.  Shelda Jakes, MD 04/14/11 2053

## 2011-04-14 NOTE — ED Provider Notes (Signed)
2030: Accepted care of pt from [facility] [place], [place]. Pt intoxicated and unable to leave department at this time due to unsteady gait. Will continue observation and plan to d/c to home when pt more alert, tolerating PO fluids and ambulating w/o difficulty.  2200: Pt noted to be resting in NAD. VS stable. Pt reports he still feels "dizzy". Will continue to observe and re-evaluate.  0200: Pt tolerating PO fluids. Declines offer of food at this time. VS stable.  0400: Pt tolerating PO fluids. Has tolerated a meal and has ambulated to BR w/o difficulty. Discussed w/ pt that he will soon be d/c'd and he will have to arrange transportation home. Pt agreeable.  0500: Pt awake and oriented. Assisted pt w/ call to signifianat other who has agreed to come pick pt up from ED. Will plan for d/c home w/ girlfriend. Pt is agreeable w/ plan.  Roma Kayser Rayland Hamed, NP 04/14/11 [phone]

## 2011-04-14 NOTE — ED Provider Notes (Signed)
History     CSN: [id number]  Arrival date & time 04/14/11  1032   First MD Initiated Contact with Patient 04/14/11 1044      Chief Complaint  Patient presents with  . Abdominal Pain  . Alcohol Intoxication    (Consider location/radiation/quality/duration/timing/severity/associated sxs/prior treatment) HPI Comments: Brought in by EMS after being picked up off the street passed out. Has been seen in the emergency department daily for the past 8 days which is discharged 5 hours ago  Patient is a 51 y.o. male presenting with abdominal pain and intoxication.  Abdominal Pain The primary symptoms of the illness include abdominal pain, nausea and vomiting. The primary symptoms of the illness do not include fatigue, shortness of breath, diarrhea, hematemesis or hematochezia. The onset of the illness was gradual. The problem has been gradually worsening.  The abdominal pain began more than 2 days ago (3 days ago). The pain came on gradually. The abdominal pain has been gradually worsening since its onset. The abdominal pain is generalized. The abdominal pain does not radiate. The abdominal pain is relieved by nothing. The abdominal pain is exacerbated by vomiting.  The illness is associated with alcohol use.  Alcohol Intoxication This is a recurrent problem. The current episode started 3 to 5 hours ago. The problem occurs constantly. The problem has been gradually worsening. Associated symptoms include abdominal pain. Pertinent negatives include no shortness of breath.    Past Medical History  Diagnosis Date  . Back pain   . Neck pain   . Bipolar 1 disorder   . ETOH abuse     No past surgical history on file.  No family history on file.  History  Substance Use Topics  . Smoking status: Never Smoker   . Smokeless tobacco: Not on file  . Alcohol Use: Yes     ETOH abuse      Review of Systems  Unable to perform ROS: Other  Constitutional: Negative for fatigue.       Intoxicated   Respiratory: Negative for shortness of breath.   Gastrointestinal: Positive for nausea, vomiting and abdominal pain. Negative for diarrhea, hematochezia and hematemesis.    Allergies  Hydrocodone  Home Medications  No current outpatient prescriptions on file.  BP 127/113  Pulse 96  Temp(Src) 97.9 F (36.6 C) (Oral)  Resp 16  SpO2 98%  Physical Exam  Nursing note and vitals reviewed. Constitutional: He appears well-developed and well-nourished. No distress.  HENT:  Head: Normocephalic and atraumatic.  Mouth/Throat: Oropharynx is clear and moist.  Eyes: Conjunctivae and EOM are normal. Pupils are equal, round, and reactive to light.  Neck: Normal range of motion. Neck supple.  Cardiovascular: Normal rate, regular rhythm, normal heart sounds and intact distal pulses.  Exam reveals no gallop and no friction rub.   No murmur heard. Pulmonary/Chest: Effort normal and breath sounds normal. No respiratory distress.  Abdominal: Soft. Bowel sounds are normal. There is tenderness. There is no rebound and no guarding.  Musculoskeletal: Normal range of motion. He exhibits no tenderness.  Neurological:       Intoxicated and smells of etoh  Skin: Skin is warm and dry.    ED Course  Procedures (including critical care time)  Labs Reviewed  CBC - Abnormal; Notable for the following:    RBC 3.84 (*)    Hemoglobin 12.1 (*)    HCT 34.2 (*)    All other components within normal limits  COMPREHENSIVE METABOLIC PANEL - Abnormal; Notable for the  following:    Sodium 148 (*)    Potassium 3.3 (*)    Calcium 8.3 (*)    All other components within normal limits  LIPASE, BLOOD - Abnormal; Notable for the following:    Lipase 263 (*)    All other components within normal limits  ETHANOL - Abnormal; Notable for the following:    Alcohol, Ethyl (B) 387 (*)    All other components within normal limits  DIFFERENTIAL  URINALYSIS, ROUTINE W REFLEX MICROSCOPIC  URINE RAPID DRUG SCREEN (HOSP  PERFORMED)   No results found.   1. Pancreatitis   2. Alcohol abuse   3. Hypokalemia       MDM  Pancreatitis and alcohol abuse. Was intoxicated. Patient received IV fluids, thiamine, folate. States he is feeling better on reassessment and that his ride was coming. Was able to walk around the department safely. Will be discharged home. I explained that his lipase was elevated and he had some damage to his pancreas from his alcohol abuse. He states that he still wants to be discharged home.        Dayton Bailiff, MD 04/14/11 [phone]

## 2011-04-14 NOTE — ED Provider Notes (Signed)
Medical screening examination/treatment/procedure(s) were conducted as a shared visit with non-physician practitioner(s) and myself.  I personally evaluated the patient during the encounter.  Patient is not psychotic. No suicidal or homicidal ideation. Does not want help to get sober   Donnetta Hutching, MD 04/14/11 [phone]

## 2011-04-14 NOTE — ED Provider Notes (Signed)
Medical screening examination/treatment/procedure(s) were performed by non-physician practitioner and as supervising physician I was immediately available for consultation/collaboration.  Celese Banner W. Tasmin Exantus, MD 04/14/11 2053 

## 2011-04-14 NOTE — ED Notes (Signed)
Pt picked up off the street, C/O abd pain and ETOH noted, PT is bypolar

## 2011-04-14 NOTE — ED Notes (Signed)
Documentation from 1048 to 1049 of triage started, vitals, triage complete documented on incorrect patient.

## 2011-04-17 ENCOUNTER — Emergency Department (HOSPITAL_COMMUNITY)
Admission: EM | Admit: 2011-04-17 | Discharge: 2011-04-19 | Disposition: A | Discharge status: Home / Self Care | Payer: Self-pay | Attending: Emergency Medicine | Admitting: Emergency Medicine

## 2011-04-17 ENCOUNTER — Encounter (HOSPITAL_COMMUNITY): Payer: Self-pay | Admitting: *Deleted

## 2011-04-17 DIAGNOSIS — R509 Fever, unspecified: Secondary | ICD-10-CM | POA: Insufficient documentation

## 2011-04-17 DIAGNOSIS — R059 Cough, unspecified: Secondary | ICD-10-CM | POA: Insufficient documentation

## 2011-04-17 DIAGNOSIS — F1020 Alcohol dependence, uncomplicated: Secondary | ICD-10-CM

## 2011-04-17 DIAGNOSIS — R4589 Other symptoms and signs involving emotional state: Secondary | ICD-10-CM | POA: Insufficient documentation

## 2011-04-17 DIAGNOSIS — R05 Cough: Secondary | ICD-10-CM | POA: Insufficient documentation

## 2011-04-17 LAB — COMPREHENSIVE METABOLIC PANEL
AST: 31 U/L (ref 0–37)
Albumin: 3.3 g/dL — ABNORMAL LOW (ref 3.5–5.2)
Alkaline Phosphatase: 104 U/L (ref 39–117)
BUN: 5 mg/dL — ABNORMAL LOW (ref 6–23)
Potassium: 2.9 mEq/L — ABNORMAL LOW (ref 3.5–5.1)
Total Protein: 7 g/dL (ref 6.0–8.3)

## 2011-04-17 LAB — DIFFERENTIAL
Basophils Absolute: 0 10*3/uL (ref 0.0–0.1)
Basophils Relative: 0 % (ref 0–1)
Eosinophils Absolute: 0.2 10*3/uL (ref 0.0–0.7)
Monocytes Relative: 6 % (ref 3–12)
Neutrophils Relative %: 56 % (ref 43–77)

## 2011-04-17 LAB — CBC
Hemoglobin: 12.1 g/dL — ABNORMAL LOW (ref 13.0–17.0)
MCH: 30.9 pg (ref 26.0–34.0)
MCHC: 34.8 g/dL (ref 30.0–36.0)
Platelets: 132 10*3/uL — ABNORMAL LOW (ref 150–400)
RDW: 16.3 % — ABNORMAL HIGH (ref 11.5–15.5)

## 2011-04-17 LAB — RAPID URINE DRUG SCREEN, HOSP PERFORMED
Amphetamines: NOT DETECTED
Cocaine: NOT DETECTED
Opiates: NOT DETECTED
Tetrahydrocannabinol: NOT DETECTED

## 2011-04-17 MED ORDER — SODIUM CHLORIDE 0.9 % IV BOLUS (SEPSIS)
1000.0000 mL | Freq: Once | INTRAVENOUS | Status: AC
  Administered 2011-04-17: 1000 mL via INTRAVENOUS

## 2011-04-17 MED ORDER — POTASSIUM CHLORIDE CRYS ER 20 MEQ PO TBCR
40.0000 meq | EXTENDED_RELEASE_TABLET | Freq: Once | ORAL | Status: AC
  Administered 2011-04-17: 40 meq via ORAL
  Filled 2011-04-17: qty 2

## 2011-04-17 NOTE — ED Provider Notes (Addendum)
History     CSN: [id number]  Arrival date & time 04/17/11  1618   First MD Initiated Contact with Patient 04/17/11 1644      Chief Complaint  Patient presents with  . Alcohol Intoxication    (Consider location/radiation/quality/duration/timing/severity/associated sxs/prior treatment) HPI Patient is a 51 year old male with history of heavy alcohol abuse who presents today with alcohol intoxication. He reports that he would like detox today. Patient was just discharged from [facility] ER this morning. He denies any other complaints. He denies suicidal or homicidal ideation. There no other associated modifying factors. Past Medical History  Diagnosis Date  . Back pain   . Neck pain   . Bipolar 1 disorder   . ETOH abuse     History reviewed. No pertinent past surgical history.  History reviewed. No pertinent family history.  History  Substance Use Topics  . Smoking status: Never Smoker   . Smokeless tobacco: Not on file  . Alcohol Use: Yes     ETOH abuse      Review of Systems  Constitutional: Negative.   HENT: Negative.   Eyes: Negative.   Respiratory: Negative.   Cardiovascular: Negative.   Gastrointestinal: Negative.   Genitourinary: Negative.   Musculoskeletal: Negative.   Neurological: Negative.   Hematological: Negative.   Psychiatric/Behavioral:       See history of present illness  All other systems reviewed and are negative.    Allergies  Hydrocodone  Home Medications  No current outpatient prescriptions on file.  BP 108/70  Pulse 96  Temp(Src) 98.5 F (36.9 C) (Oral)  Resp 18  SpO2 94%  Physical Exam  Nursing note and vitals reviewed. Constitutional: He appears well-developed and well-nourished. No distress.       Smells of alcohol. Unkempt  HENT:  Head: Normocephalic and atraumatic.  Eyes: Conjunctivae and EOM are normal. Pupils are equal, round, and reactive to light.  Neck: Normal range of motion.  Cardiovascular:  Normal rate, regular rhythm, normal heart sounds and intact distal pulses.  Exam reveals no gallop and no friction rub.   No murmur heard. Pulmonary/Chest: Effort normal and breath sounds normal. No respiratory distress. He has no wheezes. He has no rales.  Abdominal: Soft. Bowel sounds are normal. He exhibits no distension. There is no tenderness. There is no rebound and no guarding.  Musculoskeletal: Normal range of motion. He exhibits no edema.  Neurological:       Uncooperative with exam. No obvious neurologic deficits.  Skin: Skin is warm and dry. No rash noted.  Psychiatric:       Tearful reports wanting detox today. Denies suicidal or homicidal ideation    ED Course  Procedures (including critical care time)  Labs Reviewed  ETHANOL - Abnormal; Notable for the following:    Alcohol, Ethyl (B) 400 (*)    All other components within normal limits  CBC - Abnormal; Notable for the following:    RBC 3.92 (*)    Hemoglobin 12.1 (*)    HCT 34.8 (*)    RDW 16.3 (*)    Platelets 132 (*)    All other components within normal limits  COMPREHENSIVE METABOLIC PANEL - Abnormal; Notable for the following:    Potassium 2.9 (*)    Glucose, Bld 129 (*)    BUN 5 (*)    Calcium 8.0 (*)    Albumin 3.3 (*)    Total Bilirubin 0.2 (*)    All other components within normal limits  DIFFERENTIAL  ACETAMINOPHEN LEVEL  URINE RAPID DRUG SCREEN (HOSP PERFORMED)   No results found.   1. Alcohol dependence       MDM  Patient presents today intoxicated. Alcohol level is 400. This is consistent with prior presentations. Laboratory workup was ordered given that patient desired admission for possible detox today. Patient received 2 L normal saline IV bolus. Laboratory workup was remarkable for hypokalemia to 2.9. Patient is given 40 mEq of this orally. Patient continued to endorse wanting to stay. Holding orders including  CIWA protocol were placed. Act team was consulted. Patient is awaiting  assessment at this time.        Cyndra Numbers, MD 04/18/11 [id number]  Cyndra Numbers, MD 04/18/11 4401  Cyndra Numbers, MD 04/18/11 [phone]

## 2011-04-17 NOTE — ED Notes (Signed)
Pt seen at [facility] this am for alcohol intoxication. Pt states he consumed 3-4 pints since that time. Pt states he has been on 25 day binge. Pt tearful, expression need for rehab.

## 2011-04-17 NOTE — ED Notes (Signed)
ZOX:[id number]<VW> Expected date:04/17/11<BR> Expected time: 4:15 PM<BR> Means of arrival:Ambulance<BR> Comments:<BR> M80 - 50yoM ETOH, just released for acute alcohol syndrome

## 2011-04-17 NOTE — ED Notes (Signed)
EMS reports by stander saw pt slumped over, called 911, pt told EMS "I'm drunk as hell" pt will respond although has slurred speech

## 2011-04-18 ENCOUNTER — Other Ambulatory Visit: Payer: Self-pay

## 2011-04-18 ENCOUNTER — Emergency Department (HOSPITAL_COMMUNITY): Payer: Self-pay

## 2011-04-18 LAB — BASIC METABOLIC PANEL
Calcium: 8.8 mg/dL (ref 8.4–10.5)
GFR calc non Af Amer: >90 mL/min (ref >90)
Glucose, Bld: 123 mg/dL — ABNORMAL HIGH (ref 70–99)
Sodium: 133 mEq/L — ABNORMAL LOW (ref 135–145)

## 2011-04-18 LAB — ETHANOL: Alcohol, Ethyl (B): <11 mg/dL (ref 0–11)

## 2011-04-18 MED ORDER — ZOLPIDEM TARTRATE 5 MG PO TABS: 5.0000 mg | ORAL_TABLET | Freq: Every evening | ORAL | Status: DC | PRN

## 2011-04-18 MED ORDER — IBUPROFEN 600 MG PO TABS
600.0000 mg | ORAL_TABLET | Freq: Three times a day (TID) | ORAL | Status: DC | PRN
  Administered 2011-04-18: 600 mg via ORAL
  Filled 2011-04-18: qty 3

## 2011-04-18 MED ORDER — LORAZEPAM 2 MG/ML IJ SOLN
2.0000 mg | Freq: Once | INTRAMUSCULAR | Status: AC
  Administered 2011-04-18: 2 mg via INTRAVENOUS

## 2011-04-18 MED ORDER — LORAZEPAM 1 MG PO TABS
1.0000 mg | ORAL_TABLET | Freq: Four times a day (QID) | ORAL | Status: DC | PRN
  Filled 2011-04-18: qty 2

## 2011-04-18 MED ORDER — ACETAMINOPHEN 325 MG PO TABS: 650.0000 mg | ORAL_TABLET | ORAL | Status: DC | PRN

## 2011-04-18 MED ORDER — ADULT MULTIVITAMIN W/MINERALS CH
1.0000 | ORAL_TABLET | Freq: Every day | ORAL | Status: DC
  Administered 2011-04-18: 1 via ORAL
  Filled 2011-04-18: qty 1

## 2011-04-18 MED ORDER — ONDANSETRON HCL 4 MG PO TABS: 4.0000 mg | ORAL_TABLET | Freq: Three times a day (TID) | ORAL | Status: DC | PRN

## 2011-04-18 MED ORDER — SODIUM CHLORIDE 0.9 % IV BOLUS (SEPSIS)
1000.0000 mL | Freq: Once | INTRAVENOUS | Status: AC
  Administered 2011-04-18: 1000 mL via INTRAVENOUS

## 2011-04-18 MED ORDER — ONDANSETRON 4 MG PO TBDP
ORAL_TABLET | ORAL | Status: AC
  Administered 2011-04-18: 4 mg
  Filled 2011-04-18: qty 1

## 2011-04-18 MED ORDER — LORAZEPAM 2 MG/ML IJ SOLN
1.0000 mg | Freq: Four times a day (QID) | INTRAMUSCULAR | Status: DC | PRN
  Filled 2011-04-18 (×2): qty 1

## 2011-04-18 MED ORDER — IBUPROFEN 800 MG PO TABS
800.0000 mg | ORAL_TABLET | Freq: Once | ORAL | Status: AC
  Administered 2011-04-18: 800 mg via ORAL
  Filled 2011-04-18: qty 1

## 2011-04-18 MED ORDER — POTASSIUM CHLORIDE CRYS ER 20 MEQ PO TBCR
40.0000 meq | EXTENDED_RELEASE_TABLET | Freq: Once | ORAL | Status: DC
  Filled 2011-04-18: qty 2

## 2011-04-18 MED ORDER — ALUM & MAG HYDROXIDE-SIMETH 200-200-20 MG/5ML PO SUSP: 30.0000 mL | ORAL | Status: DC | PRN

## 2011-04-18 MED ORDER — THIAMINE HCL 100 MG/ML IJ SOLN: 100.0000 mg | Freq: Every day | INTRAMUSCULAR | Status: DC

## 2011-04-18 MED ORDER — LORAZEPAM 1 MG PO TABS
2.0000 mg | ORAL_TABLET | Freq: Once | ORAL | Status: AC
  Administered 2011-04-18: 2 mg via ORAL

## 2011-04-18 MED ORDER — FOLIC ACID 1 MG PO TABS
1.0000 mg | ORAL_TABLET | Freq: Every day | ORAL | Status: DC
  Administered 2011-04-18: 1 mg via ORAL
  Filled 2011-04-18: qty 1

## 2011-04-18 MED ORDER — VITAMIN B-1 100 MG PO TABS
100.0000 mg | ORAL_TABLET | Freq: Every day | ORAL | Status: DC
  Administered 2011-04-18: 100 mg via ORAL
  Filled 2011-04-18: qty 1

## 2011-04-18 MED ORDER — DIAZEPAM 5 MG PO TABS: 10.0000 mg | ORAL_TABLET | Freq: Once | ORAL | Status: DC
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
## 2011-04-18 NOTE — ED Provider Notes (Signed)
8:26 AM Pt requesting detox for [place] term ETOH abuse. Intoxicated on arrival. No HI or SI. On CIWA protocol. Awaiting placement by ACT team.   Filed Vitals:   04/18/11 0731  BP: 166/97  Pulse: 91  Temp: 98.2 F (36.8 C)  Resp: 16     Lyanne Co, MD 04/18/11 [phone]

## 2011-04-18 NOTE — BHH Counselor (Signed)
Pt was declined by Dr. Elsie Saas at [facility] this morning due to pt not appearing to be ready for detox and treatment.  Pt was at [facility] on 1-26 and has been in ED repeatedly throughout last two weeks per report.  Pt will be considered by [facility] and [facility] pending bed availability.

## 2011-04-19 MED ORDER — LORAZEPAM 1 MG PO TABS: 0.0000 mg | ORAL_TABLET | Freq: Two times a day (BID) | ORAL | Status: DC

## 2011-04-19 MED ORDER — POTASSIUM CHLORIDE CRYS ER 20 MEQ PO TBCR
40.0000 meq | EXTENDED_RELEASE_TABLET | Freq: Once | ORAL | Status: AC
  Administered 2011-04-19: 40 meq via ORAL

## 2011-04-19 MED ORDER — ADULT MULTIVITAMIN W/MINERALS CH
1.0000 | ORAL_TABLET | Freq: Every day | ORAL | Status: DC
  Administered 2011-04-19: 1 via ORAL
  Filled 2011-04-19: qty 1

## 2011-04-19 MED ORDER — LORAZEPAM 2 MG/ML IJ SOLN: 1.0000 mg | Freq: Four times a day (QID) | INTRAMUSCULAR | Status: DC | PRN

## 2011-04-19 MED ORDER — FOLIC ACID 1 MG PO TABS
1.0000 mg | ORAL_TABLET | Freq: Every day | ORAL | Status: DC
  Administered 2011-04-19: 1 mg via ORAL
  Filled 2011-04-19: qty 1

## 2011-04-19 MED ORDER — LORAZEPAM 1 MG PO TABS
1.0000 mg | ORAL_TABLET | Freq: Four times a day (QID) | ORAL | Status: DC | PRN
  Filled 2011-04-19: qty 1

## 2011-04-19 MED ORDER — LORAZEPAM 1 MG PO TABS
0.0000 mg | ORAL_TABLET | Freq: Four times a day (QID) | ORAL | Status: DC
  Administered 2011-04-19: 2 mg via ORAL
  Administered 2011-04-19: 1 mg via ORAL
  Filled 2011-04-19: qty 2

## 2011-04-19 MED ORDER — THIAMINE HCL 100 MG/ML IJ SOLN: 100.0000 mg | Freq: Every day | INTRAMUSCULAR | Status: DC

## 2011-04-19 MED ORDER — VITAMIN B-1 100 MG PO TABS
100.0000 mg | ORAL_TABLET | Freq: Every day | ORAL | Status: DC
  Administered 2011-04-19: 100 mg via ORAL
  Filled 2011-04-19: qty 1

## 2011-04-19 NOTE — BHH Counselor (Signed)
Contacted [facility] and spoke to [place]. She verified bed availability. Will continue with attempting to seek placement for patient at [facility].

## 2011-04-19 NOTE — ED Notes (Signed)
Dr. Deretha Emory notified re pt's elevated BP.  No meds ordered at this time.

## 2011-04-19 NOTE — ED Notes (Signed)
Pt came to the desk to use the phone.  Sprite given to pt.

## 2011-04-19 NOTE — ED Notes (Signed)
Two patient belonging bags, cell phone and a blue and white pattern bag locked in locker #36.

## 2011-04-19 NOTE — BH Assessment (Signed)
Patient discharged home with out-pt referrals. Writer offered patient in-pt treatment at [facility]. Staff started the process and patient then asked to be discharged home. The EDP- Dr. Jodelle Gross was informed of patients request to be discharged home. Dr. Jodelle Gross agreed to discharge patient home. Writer provided patient with various out-pt referrals so that he may follow up as needed.   **Patient was given a follow-up sheet to sign. He signed and verbalized his understanding that he needed to follow up prior to returning to the ED for treatment as he has done multiple times in the past few weeks.

## 2011-04-19 NOTE — ED Notes (Signed)
Per Jessie Foot ACT, she went in to pt's room to inform him that there is an available spot for him in [place] for 3 days for detox.  She reports that pt is refusing to go d/t transportation and financial issue.

## 2011-04-19 NOTE — ED Provider Notes (Addendum)
Patient moving to the psych ED shortly. Patient had a potassium yesterday 3.2 oral potassium was ordered but not given an order has now expired even though still listed as active. 40 mEq oral potassium we will ordered. Patient doing well this morning without any acute new or worsening problems    Shelda Jakes, MD 04/19/11 [phone]   Addendum: Patient now stating he doesn't want to wait for detox he is on involuntary status and wants to leave patient given resource guide and referral information to seek outpatient treatment as desired. Discharge arranged.  Shelda Jakes, MD 04/19/11 [phone]

## 2011-04-19 NOTE — ED Notes (Signed)
Pt ambulatory to the BR with steady gait

## 2011-04-19 NOTE — ED Notes (Signed)
Pt denies any h/a at this time but reports nausea and pain under his rib cage.

## 2011-04-19 NOTE — BHH Counselor (Deleted)
Patient declined at [facility] by Dr. Baron Sane due to acuity. [facility] referral recommended.

## 2011-04-19 NOTE — ED Notes (Signed)
Pt given water to drink as requested. Pt changed into blue paper scrubs. Pt belongings locked in cabinet in pt's room. Pt offered opportunity to void and ambulated to restroom.

## 2011-04-19 NOTE — ED Notes (Signed)
Pt care assumed, obtained verbal report. Pt resting comfortably.

## 2011-05-01 ENCOUNTER — Encounter (HOSPITAL_COMMUNITY): Payer: Self-pay

## 2011-05-01 ENCOUNTER — Emergency Department (HOSPITAL_COMMUNITY)
Admission: EM | Admit: 2011-05-01 | Discharge: 2011-05-02 | Disposition: A | Discharge status: Home / Self Care | Payer: Self-pay | Attending: Emergency Medicine | Admitting: Emergency Medicine

## 2011-05-01 DIAGNOSIS — F319 Bipolar disorder, unspecified: Secondary | ICD-10-CM | POA: Insufficient documentation

## 2011-05-01 DIAGNOSIS — F1010 Alcohol abuse, uncomplicated: Secondary | ICD-10-CM | POA: Insufficient documentation

## 2011-05-01 DIAGNOSIS — F10929 Alcohol use, unspecified with intoxication, unspecified: Secondary | ICD-10-CM

## 2011-05-01 LAB — POCT I-STAT, CHEM 8
Calcium, Ion: 1.08 mmol/L — ABNORMAL LOW (ref 1.12–1.32)
Creatinine, Ser: 1.2 mg/dL (ref 0.50–1.35)
Glucose, Bld: 113 mg/dL — ABNORMAL HIGH (ref 70–99)
HCT: 36 % — ABNORMAL LOW (ref 39.0–52.0)
Hemoglobin: 12.2 g/dL — ABNORMAL LOW (ref 13.0–17.0)

## 2011-05-01 LAB — RAPID URINE DRUG SCREEN, HOSP PERFORMED
Amphetamines: NOT DETECTED
Benzodiazepines: NOT DETECTED
Cocaine: NOT DETECTED
Opiates: NOT DETECTED
Tetrahydrocannabinol: NOT DETECTED

## 2011-05-01 MED ORDER — THIAMINE HCL 100 MG/ML IJ SOLN
Freq: Once | INTRAVENOUS | Status: AC
  Administered 2011-05-01: 20:00:00 via INTRAVENOUS
  Filled 2011-05-01: qty 1000

## 2011-05-01 MED ORDER — THIAMINE HCL 100 MG/ML IJ SOLN: Freq: Once | INTRAVENOUS | Status: DC

## 2011-05-01 NOTE — ED Notes (Signed)
AVW:[id number]<XB> Expected date:<BR> Expected time: 6:02 PM<BR> Means of arrival:<BR> Comments:<BR> M61 - 51yo M OD Cooking wine

## 2011-05-01 NOTE — ED Notes (Signed)
MD at bedside. 

## 2011-05-01 NOTE — ED Notes (Signed)
Continuous cardiac monitor applied showing sinus tachycardia Continuous pulse oximetry applied. Automatic BP cuff applied.  
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
## 2011-05-02 ENCOUNTER — Emergency Department (HOSPITAL_COMMUNITY)
Admission: EM | Admit: 2011-05-02 | Discharge: 2011-05-03 | Disposition: A | Discharge status: Home / Self Care | Payer: Self-pay | Attending: Emergency Medicine | Admitting: Emergency Medicine

## 2011-05-02 ENCOUNTER — Encounter (HOSPITAL_COMMUNITY): Payer: Self-pay

## 2011-05-02 DIAGNOSIS — F1010 Alcohol abuse, uncomplicated: Secondary | ICD-10-CM | POA: Insufficient documentation

## 2011-05-02 DIAGNOSIS — F319 Bipolar disorder, unspecified: Secondary | ICD-10-CM | POA: Insufficient documentation

## 2011-05-02 DIAGNOSIS — F10929 Alcohol use, unspecified with intoxication, unspecified: Secondary | ICD-10-CM

## 2011-05-02 LAB — POCT I-STAT, CHEM 8
Calcium, Ion: 0.99 mmol/L — ABNORMAL LOW (ref 1.12–1.32)
Creatinine, Ser: 0.9 mg/dL (ref 0.50–1.35)
Glucose, Bld: 99 mg/dL (ref 70–99)
HCT: 36 % — ABNORMAL LOW (ref 39.0–52.0)
Hemoglobin: 12.2 g/dL — ABNORMAL LOW (ref 13.0–17.0)

## 2011-05-02 LAB — COMPREHENSIVE METABOLIC PANEL
AST: 35 U/L (ref 0–37)
Albumin: 3.3 g/dL — ABNORMAL LOW (ref 3.5–5.2)
BUN: 8 mg/dL (ref 6–23)
CO2: 24 mEq/L (ref 19–32)
Calcium: 8.1 mg/dL — ABNORMAL LOW (ref 8.4–10.5)
Creatinine, Ser: 0.62 mg/dL (ref 0.50–1.35)
GFR calc non Af Amer: >90 mL/min (ref >90)
Total Bilirubin: 0.1 mg/dL — ABNORMAL LOW (ref 0.3–1.2)

## 2011-05-02 LAB — CBC
Hemoglobin: 11.9 g/dL — ABNORMAL LOW (ref 13.0–17.0)
MCH: 31.4 pg (ref 26.0–34.0)
MCV: 93.7 fL (ref 78.0–100.0)
RBC: 3.79 MIL/uL — ABNORMAL LOW (ref 4.22–5.81)

## 2011-05-02 LAB — DIFFERENTIAL
Eosinophils Absolute: 0.1 10*3/uL (ref 0.0–0.7)
Lymphocytes Relative: 32 % (ref 12–46)
Lymphs Abs: 1.9 10*3/uL (ref 0.7–4.0)
Monocytes Relative: 6 % (ref 3–12)
Neutrophils Relative %: 60 % (ref 43–77)

## 2011-05-02 LAB — ETHANOL: Alcohol, Ethyl (B): 260 mg/dL — ABNORMAL HIGH (ref 0–11)

## 2011-05-02 MED ORDER — THIAMINE HCL 100 MG/ML IJ SOLN
INTRAVENOUS | Status: AC
  Administered 2011-05-02: 15:00:00 via INTRAVENOUS
  Filled 2011-05-02: qty 1000

## 2011-05-02 NOTE — ED Notes (Signed)
Pt awake and speaking to this RN.  Pt spoke to GF and she is unable to pick pt up until morning.  Pt drinking water and aware that another meal tray is at bedside.

## 2011-05-02 NOTE — ED Notes (Signed)
Report given and care endorsed to Barbara, RN.

## 2011-05-02 NOTE — ED Provider Notes (Signed)
History     CSN: [id number]  Arrival date & time 05/02/11  1130   First MD Initiated Contact with Patient 05/02/11 1216      Chief Complaint  Patient presents with  . Alcohol Intoxication    (Consider location/radiation/quality/duration/timing/severity/associated sxs/prior treatment) HPI Comments: Patient was found at [facility] intoxicated. Has a history of frequent alcohol intoxication visits to the emergency department. Was too intoxicated for police so was brought for eval and medical clearance.  Patient is a 51 y.o. male presenting with intoxication. The history is provided by the patient. No language interpreter was used.  Alcohol Intoxication This is a recurrent problem. The current episode started 3 to 5 hours ago. The problem occurs constantly. The problem has been gradually worsening. Associated symptoms include abdominal pain. Pertinent negatives include no chest pain, no headaches and no shortness of breath. The symptoms are aggravated by nothing. The symptoms are relieved by nothing. He has tried nothing for the symptoms. The treatment provided no relief.    Past Medical History  Diagnosis Date  . Back pain   . Neck pain   . Bipolar 1 disorder   . ETOH abuse     History reviewed. No pertinent past surgical history.  History reviewed. No pertinent family history.  History  Substance Use Topics  . Smoking status: Never Smoker   . Smokeless tobacco: Not on file  . Alcohol Use: Yes     ETOH abuse      Review of Systems  Constitutional: Negative for fever, activity change, appetite change and fatigue.  HENT: Negative for congestion, sore throat, rhinorrhea, neck pain and neck stiffness.   Respiratory: Negative for cough and shortness of breath.   Cardiovascular: Negative for chest pain and palpitations.  Gastrointestinal: Positive for nausea and abdominal pain. Negative for vomiting.  Genitourinary: Negative for dysuria, frequency and flank pain.    Musculoskeletal: Negative for myalgias, back pain and arthralgias.  Neurological: Negative for dizziness, weakness, light-headedness, numbness and headaches.  All other systems reviewed and are negative.    Allergies  Hydrocodone  Home Medications  No current outpatient prescriptions on file.  BP 100/54  Pulse 100  Temp(Src) 97.7 F (36.5 C) (Oral)  Resp 16  SpO2 99%  Physical Exam  Nursing note and vitals reviewed. Constitutional: He appears well-developed and well-nourished. No distress.       sleeping  HENT:  Head: Normocephalic and atraumatic.  Mouth/Throat: Oropharynx is clear and moist.  Eyes: Conjunctivae and EOM are normal. Pupils are equal, round, and reactive to light.  Neck: Normal range of motion. Neck supple.  Cardiovascular: Normal rate, regular rhythm, normal heart sounds and intact distal pulses.  Exam reveals no gallop and no friction rub.   No murmur heard. Pulmonary/Chest: Effort normal and breath sounds normal. No respiratory distress.  Abdominal: Soft. Bowel sounds are normal. There is tenderness (diffuse).  Musculoskeletal: Normal range of motion. He exhibits no tenderness.  Neurological:       intoxicated  Skin: Skin is warm and dry.    ED Course  Procedures (including critical care time)  Labs Reviewed  COMPREHENSIVE METABOLIC PANEL - Abnormal; Notable for the following:    Sodium 148 (*)    Chloride 116 (*)    Glucose, Bld 104 (*)    Calcium 8.1 (*)    Albumin 3.3 (*)    ALT 54 (*)    Total Bilirubin 0.1 (*)    All other components within normal limits  CBC - Abnormal;  Notable for the following:    RBC 3.79 (*)    Hemoglobin 11.9 (*)    HCT 35.5 (*)    RDW 17.0 (*)    All other components within normal limits  ETHANOL - Abnormal; Notable for the following:    Alcohol, Ethyl (B) 393 (*)    All other components within normal limits  LIPASE, BLOOD  DIFFERENTIAL   No results found.   1. Alcohol intoxication       MDM   Intoxication. Laboratory studies were performed as he has a history of pancreatitis. An IV was placed he'll receive IV fluids, thiamine, folate. Patient will remain in the emergency department was clinically sober and able to ambulate without difficulty. The patient will be signed out to my colleague Dr. Ignacia Palma. He will followup on the patient's status        Dayton Bailiff, MD 05/02/11 [phone]

## 2011-05-02 NOTE — ED Provider Notes (Signed)
History     CSN: [id number]  Arrival date & time 05/01/11  1805   First MD Initiated Contact with Patient 05/01/11 1849      Chief Complaint  Patient presents with  . Alcohol Intoxication    Patient is a 51 y.o. male presenting with intoxication. The history is provided by the patient and the EMS personnel. History Limited By: alcohol intoxicated.  Alcohol Intoxication  Pt was seen at 1910.  Per EMS and pt report, c/o gradual onset and persistence of etoh abuse that began today PTA.  Pt was found in a grocery store drinking "cooking wine."  Pt does not want etoh detox, states he "just needs to sleep a while."   Past Medical History  Diagnosis Date  . Back pain   . Neck pain   . Bipolar 1 disorder   . ETOH abuse     History reviewed. No pertinent past surgical history.    History  Substance Use Topics  . Smoking status: Never Smoker   . Smokeless tobacco: Not on file  . Alcohol Use: Yes     ETOH abuse    Review of Systems  Unable to perform ROS: Other    Allergies  Hydrocodone  Home Medications  No current outpatient prescriptions on file.  BP 94/70  Pulse 83  Temp(Src) 98.1 F (36.7 C) (Oral)  Resp 18  SpO2 96%  Physical Exam 1915: Physical examination:  Nursing notes reviewed; Vital signs and O2 SAT reviewed;  Constitutional: Well developed, Well nourished, In no acute distress; Head:  Normocephalic, atraumatic; Eyes: EOMI, PERRL, No scleral icterus; ENMT: Mouth and pharynx normal, Mucous membranes dry; Neck: Supple, Full range of motion, No lymphadenopathy; Cardiovascular: Regular rate and rhythm, No murmur, rub, or gallop; Respiratory: Breath sounds clear & equal bilaterally, No rales, rhonchi, wheezes, or rub, Normal respiratory effort/excursion; Chest: Nontender, Movement normal; Abdomen: Soft, Nontender, Nondistended, Normal bowel sounds; Extremities: Pulses normal, No tenderness, No edema, No calf edema or asymmetry.; Neuro: Lethargic, but awakens to  name, talks with staff then falls back asleep, No facial droop, moves all ext on stretcher spontaneously.; Skin: Color normal, Warm, Dry.    ED Course  Procedures    MDM  MDM Reviewed: previous chart, nursing note and vitals Interpretation: labs   Results for orders placed during the hospital encounter of 05/01/11  ETHANOL      Component Value Range   Alcohol, Ethyl (B) 392 (*) 0 - 11 (mg/dL)  URINE RAPID DRUG SCREEN (HOSP PERFORMED)      Component Value Range   Opiates NONE DETECTED  NONE DETECTED    Cocaine NONE DETECTED  NONE DETECTED    Benzodiazepines NONE DETECTED  NONE DETECTED    Amphetamines NONE DETECTED  NONE DETECTED    Tetrahydrocannabinol NONE DETECTED  NONE DETECTED    Barbiturates NONE DETECTED  NONE DETECTED   POCT I-STAT, CHEM 8      Component Value Range   Sodium 153 (*) 135 - 145 (mEq/L)   Potassium 4.0  3.5 - 5.1 (mEq/L)   Chloride 117 (*) 96 - 112 (mEq/L)   BUN 9  6 - 23 (mg/dL)   Creatinine, Ser 0.27  0.50 - 1.35 (mg/dL)   Glucose, Bld 253 (*) 70 - 99 (mg/dL)   Calcium, Ion 6.64 (*) 1.12 - 1.32 (mmol/L)   TCO2 23  0 - 100 (mmol/L)   Hemoglobin 12.2 (*) 13.0 - 17.0 (g/dL)   HCT 40.3 (*) 47.4 - 52.0 (%)  1:45 AM:  Pt is sleeping soundly, resps easy.  IVF infusing.  Will need re-check of BMP in morning and demonstrate sobriety.  Sign out to Dr. Nicanor Alcon.             Laray Anger, DO 05/03/11 1432

## 2011-05-02 NOTE — ED Notes (Signed)
Patient is resting comfortably with eyes closed respirations equal and non-labored.  Will continue to monitor fro s/s of possible withdrawal

## 2011-05-02 NOTE — ED Notes (Signed)
MD at bedside. 

## 2011-05-02 NOTE — ED Notes (Signed)
Pt has slept the majority of the time since my shift began at 1100. NAD, respirations equal and non-labored.  Pt is easily roused but is incoherent when questioned.  Encouraged pt to get up and walk to the bathroom so that be may be assessed during ambulation.  Gait is unsteady.  Pt does report some vertigo.

## 2011-05-02 NOTE — ED Notes (Signed)
Pt was being taken to jail and too intoxicated for them. Pt incomprehensible in traige. Per gpd pt was found at [facility] and they were called to pick him up. Same thing occurred yesterday.

## 2011-05-03 LAB — GLUCOSE, CAPILLARY
Comment 1: 0
Glucose-Capillary: 95 mg/dL (ref 70–99)

## 2011-05-03 MED ORDER — SODIUM CHLORIDE 0.9 % IV SOLN
INTRAVENOUS | Status: DC
  Administered 2011-05-03: 01:00:00 via INTRAVENOUS

## 2011-05-03 NOTE — ED Provider Notes (Addendum)
12:07 AM Pt sleeping peacefully.  His ETOH was around 260 at 8:30 P.M.  Will repeat his ETOH at 4 A.M. With the plan to discharge him in the morning when it is light out.  Signed out to Dr. Hyacinth Meeker at 1:00 A.M.   Carleene Cooper III, MD 05/03/11 0009  Carleene Cooper III, MD 05/03/11 [phone]

## 2011-05-03 NOTE — ED Provider Notes (Signed)
  Physical Exam  BP 133/87  Pulse 97  Temp(Src) 97.7 F (36.5 C) (Oral)  Resp 20  SpO2 100%  Physical Exam  ED Course  Procedures  MDM Patient now clinically sober, alcohol level less than 200, has been able to ambulate, tolerate by mouth fluids and foods and is stable for discharge       Vida Roller, MD 05/03/11 [phone]

## 2011-05-03 NOTE — ED Notes (Signed)
Steady on his feet.  Walked to the bathroom without difficulty

## 2011-06-27 ENCOUNTER — Emergency Department (HOSPITAL_COMMUNITY)
Admission: EM | Admit: 2011-06-27 | Discharge: 2011-06-28 | Disposition: A | Discharge status: Home / Self Care | Payer: Self-pay | Attending: Emergency Medicine | Admitting: Emergency Medicine

## 2011-06-27 ENCOUNTER — Emergency Department (HOSPITAL_COMMUNITY): Payer: Self-pay

## 2011-06-27 DIAGNOSIS — S0990XA Unspecified injury of head, initial encounter: Secondary | ICD-10-CM | POA: Insufficient documentation

## 2011-06-27 DIAGNOSIS — R4789 Other speech disturbances: Secondary | ICD-10-CM | POA: Insufficient documentation

## 2011-06-27 DIAGNOSIS — IMO0002 Reserved for concepts with insufficient information to code with codable children: Secondary | ICD-10-CM | POA: Insufficient documentation

## 2011-06-27 DIAGNOSIS — F10929 Alcohol use, unspecified with intoxication, unspecified: Secondary | ICD-10-CM

## 2011-06-27 DIAGNOSIS — F1010 Alcohol abuse, uncomplicated: Secondary | ICD-10-CM | POA: Insufficient documentation

## 2011-06-27 DIAGNOSIS — Y9289 Other specified places as the place of occurrence of the external cause: Secondary | ICD-10-CM | POA: Insufficient documentation

## 2011-06-27 DIAGNOSIS — M546 Pain in thoracic spine: Secondary | ICD-10-CM | POA: Insufficient documentation

## 2011-06-27 DIAGNOSIS — M545 Low back pain, unspecified: Secondary | ICD-10-CM | POA: Insufficient documentation

## 2011-06-27 DIAGNOSIS — W19XXXA Unspecified fall, initial encounter: Secondary | ICD-10-CM | POA: Insufficient documentation

## 2011-06-27 DIAGNOSIS — E876 Hypokalemia: Secondary | ICD-10-CM | POA: Insufficient documentation

## 2011-06-27 LAB — DIFFERENTIAL
Basophils Absolute: 0 10*3/uL (ref 0.0–0.1)
Basophils Relative: 0 % (ref 0–1)
Lymphocytes Relative: 31 % (ref 12–46)
Neutro Abs: 4.5 10*3/uL (ref 1.7–7.7)
Neutrophils Relative %: 59 % (ref 43–77)

## 2011-06-27 LAB — CBC
HCT: 37.3 % — ABNORMAL LOW (ref 39.0–52.0)
Platelets: 251 10*3/uL (ref 150–400)
RDW: 14.8 % (ref 11.5–15.5)
WBC: 7.6 10*3/uL (ref 4.0–10.5)

## 2011-06-27 LAB — BASIC METABOLIC PANEL
CO2: 28 mEq/L (ref 19–32)
Chloride: 106 mEq/L (ref 96–112)
GFR calc Af Amer: >90 mL/min (ref >90)
Potassium: 3.2 mEq/L — ABNORMAL LOW (ref 3.5–5.1)
Sodium: 143 mEq/L (ref 135–145)

## 2011-06-27 LAB — ETHANOL: Alcohol, Ethyl (B): 318 mg/dL — ABNORMAL HIGH (ref 0–11)

## 2011-06-27 MED ORDER — ZIPRASIDONE MESYLATE 20 MG IM SOLR: 20.0000 mg | Freq: Once | INTRAMUSCULAR | Status: DC

## 2011-06-27 MED ORDER — ZIPRASIDONE MESYLATE 20 MG IM SOLR
INTRAMUSCULAR | Status: AC
  Filled 2011-06-27: qty 20

## 2011-06-27 MED ORDER — POTASSIUM CHLORIDE 20 MEQ/15ML (10%) PO LIQD
20.0000 meq | Freq: Once | ORAL | Status: AC
  Administered 2011-06-28: 20 meq via ORAL
  Filled 2011-06-27: qty 15

## 2011-06-27 MED ORDER — SODIUM CHLORIDE 0.9 % IV BOLUS (SEPSIS): 1000.0000 mL | Freq: Once | INTRAVENOUS | Status: DC

## 2011-06-27 NOTE — ED Provider Notes (Signed)
History     CSN: [id number]  Arrival date & time 06/27/11  1638   First MD Initiated Contact with Patient 06/27/11 1726      Chief Complaint  Patient presents with  . Alcohol Intoxication  . Fall  . Back Pain    (Consider location/radiation/quality/duration/timing/severity/associated sxs/prior treatment) HPI Pt is intoxicated and admits to heavy alcohol consumption. He was found in parking lot by wife with questionable fall and head injury. EMS was called and he was placed in c-collar and Hashman board. Pt has severely slurred speech and is uncooperative attempting to take of collar. Denies co-ingestion or SI/HI  Past Medical History  Diagnosis Date  . Back pain   . Neck pain   . Bipolar 1 disorder   . ETOH abuse     No past surgical history on file.  No family history on file.  History  Substance Use Topics  . Smoking status: Never Smoker   . Smokeless tobacco: Not on file  . Alcohol Use: Yes     ETOH abuse      Review of Systems  Unable to perform ROS HENT: Negative for neck pain.   Musculoskeletal: Positive for back pain.  Neurological: Negative for headaches.  Psychiatric/Behavioral: Negative for suicidal ideas.  All other systems reviewed and are negative.    Allergies  Hydrocodone  Home Medications  No current outpatient prescriptions on file.  BP 110/64  Pulse 89  Temp(Src) 97.9 F (36.6 C) (Oral)  Resp 18  SpO2 98%  Physical Exam  Nursing note and vitals reviewed. Constitutional: He appears well-developed and well-nourished. No distress.       Uncooperative attempting to remove c-collar  HENT:  Head: Normocephalic and atraumatic.  Mouth/Throat: Oropharynx is clear and moist.       No evidence of trauma to head or neck.   Eyes: EOM are normal. Pupils are equal, round, and reactive to light.  Neck:       c-collar applied prior to arrival  Cardiovascular: Normal rate and regular rhythm.   Pulmonary/Chest: Effort normal and breath sounds  normal. No respiratory distress. He has no wheezes. He has no rales.  Abdominal: Soft. Bowel sounds are normal. There is no tenderness. There is no rebound and no guarding.  Musculoskeletal: Normal range of motion. He exhibits tenderness. He exhibits no edema.       Diffuse entire thoracic and lumbar spine ttp without focality or deformity. No evidence of trauma  Neurological:       Slurred incomprehensible speech. Intermittently following commands. Moves all extremities  Skin: Skin is warm and dry. No rash noted. No erythema.       Mild abrasion to L forearm. No deformtiy    ED Course  Procedures (including critical care time)  Labs Reviewed  CBC - Abnormal; Notable for the following:    RBC 4.18 (*)    Hemoglobin 12.9 (*)    HCT 37.3 (*)    All other components within normal limits  BASIC METABOLIC PANEL - Abnormal; Notable for the following:    Potassium 3.2 (*)    Glucose, Bld 118 (*)    All other components within normal limits  ETHANOL - Abnormal; Notable for the following:    Alcohol, Ethyl (B) 318 (*)    All other components within normal limits  DIFFERENTIAL  URINE RAPID DRUG SCREEN (HOSP PERFORMED)   Ct Head Wo Contrast  06/27/2011  *RADIOLOGY REPORT*  Clinical Data: Head trauma.  CT HEAD WITHOUT  CONTRAST  Technique:  Contiguous axial images were obtained from the base of the skull through the vertex without contrast.  Comparison: 05/03/2011  Findings: There is no acute intracranial hemorrhage, infarction, or mass.  Brain parenchyma is normal.  Osseous structures are normal.  IMPRESSION: Normal exam.  Original Report Authenticated By: Gwynn Burly, M.D.   Ct Cervical Spine Wo Contrast  06/27/2011  *RADIOLOGY REPORT*  Clinical Data: Head trauma.  CT CERVICAL SPINE WITHOUT CONTRAST  Technique:  Multidetector CT imaging of the cervical spine was performed. Multiplanar CT image reconstructions were also generated.  Comparison: 04/10/2011  Findings: There is no fracture, acute  subluxation, prevertebral soft tissue swelling, or other significant acute abnormality. There is fairly severe degenerative disc disease at C4-5. Multilevel facet arthritis.  IMPRESSION: No acute abnormality of the cervical spine.  The study is somewhat degraded by motion artifact because the patient was agitated.  Original Report Authenticated By: Gwynn Burly, M.D.     1. Alcohol intoxication   2. Hypokalemia       MDM  Pt attempted to leave. Will sedate and restrain. Pt does not have medical decision making capacity and no family present. Will precede with CT and lab work and hold until clinically sober    Pt more alert. Not slurring words. Will feed and ambulate    Pt ambulatory, steady, tolerating PO's. D/c home  Loren Racer, MD 06/28/11 [phone]

## 2011-06-27 NOTE — ED Notes (Signed)
Pt was found in hotel parking lot by his wife. He had been drinking all day according to wife. Pt admits to etoh, reports back pain with hx of back pain. No loc.

## 2011-06-27 NOTE — ED Notes (Addendum)
Pt had episode where he got up, got clothed, and stated that "he was going home." He also removed his C collar at this time. Pt was asked to sit back down to which he complied and has been sleeping ever since. When his girlfriend was called she stated she would not be able to come pick him up till morning.   Tonya Stewart ([phone]) [phone]. [phone]

## 2011-06-28 ENCOUNTER — Emergency Department (HOSPITAL_COMMUNITY)
Admission: EM | Admit: 2011-06-28 | Discharge: 2011-06-29 | Disposition: A | Discharge status: Home / Self Care | Payer: Self-pay | Attending: Emergency Medicine | Admitting: Emergency Medicine

## 2011-06-28 ENCOUNTER — Encounter (HOSPITAL_COMMUNITY): Payer: Self-pay | Admitting: Emergency Medicine

## 2011-06-28 ENCOUNTER — Emergency Department (HOSPITAL_COMMUNITY)
Admission: EM | Admit: 2011-06-28 | Discharge: 2011-06-28 | Disposition: A | Discharge status: Home / Self Care | Payer: Self-pay | Attending: Emergency Medicine | Admitting: Emergency Medicine

## 2011-06-28 DIAGNOSIS — F1010 Alcohol abuse, uncomplicated: Secondary | ICD-10-CM | POA: Insufficient documentation

## 2011-06-28 DIAGNOSIS — F10929 Alcohol use, unspecified with intoxication, unspecified: Secondary | ICD-10-CM

## 2011-06-28 DIAGNOSIS — F319 Bipolar disorder, unspecified: Secondary | ICD-10-CM | POA: Insufficient documentation

## 2011-06-28 DIAGNOSIS — F3130 Bipolar disorder, current episode depressed, mild or moderate severity, unspecified: Secondary | ICD-10-CM | POA: Insufficient documentation

## 2011-06-28 LAB — COMPREHENSIVE METABOLIC PANEL
ALT: 28 U/L (ref 0–53)
ALT: 32 U/L (ref 0–53)
Albumin: 4.1 g/dL (ref 3.5–5.2)
Alkaline Phosphatase: 60 U/L (ref 39–117)
BUN: 11 mg/dL (ref 6–23)
Calcium: 8.7 mg/dL (ref 8.4–10.5)
Chloride: 106 mEq/L (ref 96–112)
GFR calc Af Amer: >90 mL/min (ref >90)
GFR calc Af Amer: >90 mL/min (ref >90)
Glucose, Bld: 91 mg/dL (ref 70–99)
Glucose, Bld: 108 mg/dL — ABNORMAL HIGH (ref 70–99)
Potassium: 3.1 mEq/L — ABNORMAL LOW (ref 3.5–5.1)
Sodium: 145 mEq/L (ref 135–145)
Sodium: 148 mEq/L — ABNORMAL HIGH (ref 135–145)
Total Bilirubin: 0.2 mg/dL — ABNORMAL LOW (ref 0.3–1.2)
Total Protein: 7.6 g/dL (ref 6.0–8.3)
Total Protein: 7.4 g/dL (ref 6.0–8.3)

## 2011-06-28 LAB — CBC
HCT: 40.3 % (ref 39.0–52.0)
Hemoglobin: 13.8 g/dL (ref 13.0–17.0)
Hemoglobin: 12.9 g/dL — ABNORMAL LOW (ref 13.0–17.0)
MCH: 30.9 pg (ref 26.0–34.0)
MCHC: 33.9 g/dL (ref 30.0–36.0)
Platelets: 255 10*3/uL (ref 150–400)
RDW: 14.7 % (ref 11.5–15.5)
WBC: 8.4 10*3/uL (ref 4.0–10.5)

## 2011-06-28 LAB — RAPID URINE DRUG SCREEN, HOSP PERFORMED
Amphetamines: NOT DETECTED
Barbiturates: NOT DETECTED
Benzodiazepines: NOT DETECTED

## 2011-06-28 LAB — ETHANOL: Alcohol, Ethyl (B): 274 mg/dL — ABNORMAL HIGH (ref 0–11)

## 2011-06-28 MED ORDER — VITAMIN B-1 100 MG PO TABS: 100.0000 mg | ORAL_TABLET | Freq: Every day | ORAL | Status: DC

## 2011-06-28 MED ORDER — CHLORDIAZEPOXIDE HCL 25 MG PO CAPS: 10.0000 mg | ORAL_CAPSULE | Freq: Three times a day (TID) | ORAL | Status: AC | PRN

## 2011-06-28 MED ORDER — LORAZEPAM 2 MG/ML IJ SOLN: 1.0000 mg | Freq: Four times a day (QID) | INTRAMUSCULAR | Status: DC | PRN

## 2011-06-28 MED ORDER — ADULT MULTIVITAMIN W/MINERALS CH: 1.0000 | ORAL_TABLET | Freq: Every day | ORAL | Status: DC

## 2011-06-28 MED ORDER — LORAZEPAM 1 MG PO TABS: 1.0000 mg | ORAL_TABLET | Freq: Three times a day (TID) | ORAL | Status: DC | PRN

## 2011-06-28 MED ORDER — ZOLPIDEM TARTRATE 5 MG PO TABS: 5.0000 mg | ORAL_TABLET | Freq: Every evening | ORAL | Status: DC | PRN

## 2011-06-28 MED ORDER — ALUM & MAG HYDROXIDE-SIMETH 200-200-20 MG/5ML PO SUSP: 30.0000 mL | ORAL | Status: DC | PRN

## 2011-06-28 MED ORDER — LORAZEPAM 1 MG PO TABS: 1.0000 mg | ORAL_TABLET | Freq: Four times a day (QID) | ORAL | Status: DC | PRN

## 2011-06-28 MED ORDER — THIAMINE HCL 100 MG/ML IJ SOLN: 100.0000 mg | Freq: Every day | INTRAMUSCULAR | Status: DC

## 2011-06-28 MED ORDER — POTASSIUM CHLORIDE CRYS ER 20 MEQ PO TBCR
40.0000 meq | EXTENDED_RELEASE_TABLET | Freq: Once | ORAL | Status: AC
  Administered 2011-06-28: 40 meq via ORAL
  Filled 2011-06-28: qty 2

## 2011-06-28 MED ORDER — ONDANSETRON HCL 4 MG PO TABS: 4.0000 mg | ORAL_TABLET | Freq: Three times a day (TID) | ORAL | Status: DC | PRN

## 2011-06-28 MED ORDER — ACETAMINOPHEN 325 MG PO TABS: 650.0000 mg | ORAL_TABLET | ORAL | Status: DC | PRN

## 2011-06-28 MED ORDER — FOLIC ACID 1 MG PO TABS: 1.0000 mg | ORAL_TABLET | Freq: Every day | ORAL | Status: DC

## 2011-06-28 MED ORDER — NICOTINE 21 MG/24HR TD PT24: 21.0000 mg | MEDICATED_PATCH | Freq: Every day | TRANSDERMAL | Status: DC

## 2011-06-28 MED ORDER — LORAZEPAM 1 MG PO TABS
1.0000 mg | ORAL_TABLET | Freq: Once | ORAL | Status: AC
  Administered 2011-06-28: 1 mg via ORAL
  Filled 2011-06-28: qty 1

## 2011-06-28 MED ORDER — IBUPROFEN 200 MG PO TABS: 600.0000 mg | ORAL_TABLET | Freq: Three times a day (TID) | ORAL | Status: DC | PRN

## 2011-06-28 NOTE — ED Notes (Signed)
Patient made phone call to friend for a ride home when discharged.

## 2011-06-28 NOTE — ED Notes (Signed)
Belongings bag x 1 locked in locker #27

## 2011-06-28 NOTE — ED Notes (Signed)
ACT Team in with patient at this time.
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
## 2011-06-28 NOTE — ED Provider Notes (Signed)
History     CSN: [id number]  Arrival date & time 06/28/11  7829   First MD Initiated Contact with Patient 06/28/11 1007      Chief Complaint  Patient presents with  . Medical Clearance    (Consider location/radiation/quality/duration/timing/severity/associated sxs/prior treatment) The history is provided by the patient.  pt states hx bipolar disorder, states also w etoh abuse. Wants help w etoh abuse. Drinks daily, cant quantify amount. States etoh and depression have been chronic problem, but worse in past couple weeks.  Denies hx seizures or dts. Last drank early this am. States also feeling depressed due to etoh abuse, and inability to help/provide assistance to father. Denies overdose. Denies suicidal thoughts. Denies any other symptoms. Eating normally. No nvd. No abd pain. No chest pain or sob. No headache. No neck or back pain.     Past Medical History  Diagnosis Date  . Back pain   . Neck pain   . Bipolar 1 disorder   . ETOH abuse     History reviewed. No pertinent past surgical history.  No family history on file.  History  Substance Use Topics  . Smoking status: Never Smoker   . Smokeless tobacco: Not on file  . Alcohol Use: Yes     ETOH abuse      Review of Systems  Constitutional: Negative for fever.  HENT: Negative for neck pain.   Eyes: Negative for visual disturbance.  Respiratory: Negative for cough and shortness of breath.   Cardiovascular: Negative for chest pain.  Gastrointestinal: Negative for abdominal pain.  Genitourinary: Negative for flank pain.  Musculoskeletal: Negative for back pain.  Skin: Negative for rash.  Neurological: Negative for headaches.  Hematological: Does not bruise/bleed easily.  Psychiatric/Behavioral: Negative for confusion.    Allergies  Hydrocodone  Home Medications  No current outpatient prescriptions on file.  BP 113/68  Pulse 121  Temp(Src) 98.8 F (37.1 C) (Oral)  Resp 20  SpO2 100%  Physical Exam    Nursing note and vitals reviewed. Constitutional: He is oriented to person, place, and time. He appears well-developed and well-nourished. No distress.  HENT:  Head: Atraumatic.  Eyes: Pupils are equal, round, and reactive to light.  Neck: Neck supple. No tracheal deviation present.  Cardiovascular: Regular rhythm, normal heart sounds and intact distal pulses.  Exam reveals no gallop and no friction rub.   No murmur heard. Pulmonary/Chest: Effort normal and breath sounds normal. No accessory muscle usage. No respiratory distress.  Abdominal: Soft. He exhibits no distension. There is no tenderness.  Musculoskeletal: Normal range of motion. He exhibits no edema and no tenderness.  Neurological: He is alert and oriented to person, place, and time.  Skin: Skin is warm and dry.  Psychiatric: He has a normal mood and affect.    ED Course  Procedures (including critical care time)   Labs Reviewed  CBC  COMPREHENSIVE METABOLIC PANEL  ETHANOL  URINE RAPID DRUG SCREEN (HOSP PERFORMED)   Ct Head Wo Contrast  06/27/2011  *RADIOLOGY REPORT*  Clinical Data: Head trauma.  CT HEAD WITHOUT CONTRAST  Technique:  Contiguous axial images were obtained from the base of the skull through the vertex without contrast.  Comparison: 05/03/2011  Findings: There is no acute intracranial hemorrhage, infarction, or mass.  Brain parenchyma is normal.  Osseous structures are normal.  IMPRESSION: Normal exam.  Original Report Authenticated By: Gwynn Burly, M.D.   Ct Cervical Spine Wo Contrast  06/27/2011  *RADIOLOGY REPORT*  Clinical Data:  Head trauma.  CT CERVICAL SPINE WITHOUT CONTRAST  Technique:  Multidetector CT imaging of the cervical spine was performed. Multiplanar CT image reconstructions were also generated.  Comparison: 04/10/2011  Findings: There is no fracture, acute subluxation, prevertebral soft tissue swelling, or other significant acute abnormality. There is fairly severe degenerative disc disease  at C4-5. Multilevel facet arthritis.  IMPRESSION: No acute abnormality of the cervical spine.  The study is somewhat degraded by motion artifact because the patient was agitated.  Original Report Authenticated By: Gwynn Burly, M.D.   Results for orders placed during the hospital encounter of 06/28/11  CBC      Component Value Range   WBC 8.4  4.0 - 10.5 (K/uL)   RBC 4.50  4.22 - 5.81 (MIL/uL)   Hemoglobin 13.8  13.0 - 17.0 (g/dL)   HCT 47.8  29.5 - 62.1 (%)   MCV 89.6  78.0 - 100.0 (fL)   MCH 30.7  26.0 - 34.0 (pg)   MCHC 34.2  30.0 - 36.0 (g/dL)   RDW 30.8  65.7 - 84.6 (%)   Platelets 283  150 - 400 (K/uL)  COMPREHENSIVE METABOLIC PANEL      Component Value Range   Sodium 145  135 - 145 (mEq/L)   Potassium 3.1 (*) 3.5 - 5.1 (mEq/L)   Chloride 106  96 - 112 (mEq/L)   CO2 27  19 - 32 (mEq/L)   Glucose, Bld 91  70 - 99 (mg/dL)   BUN 11  6 - 23 (mg/dL)   Creatinine, Ser 9.62  0.50 - 1.35 (mg/dL)   Calcium 9.4  8.4 - 95.2 (mg/dL)   Total Protein 7.6  6.0 - 8.3 (g/dL)   Albumin 4.1  3.5 - 5.2 (g/dL)   AST 43 (*) 0 - 37 (U/L)   ALT 28  0 - 53 (U/L)   Alkaline Phosphatase 60  39 - 117 (U/L)   Total Bilirubin 0.2 (*) 0.3 - 1.2 (mg/dL)   GFR calc non Af Amer >90  >90 (mL/min)   GFR calc Af Amer >90  >90 (mL/min)  ETHANOL      Component Value Range   Alcohol, Ethyl (B) 274 (*) 0 - 11 (mg/dL)  URINE RAPID DRUG SCREEN (HOSP PERFORMED)      Component Value Range   Opiates NONE DETECTED  NONE DETECTED    Cocaine NONE DETECTED  NONE DETECTED    Benzodiazepines NONE DETECTED  NONE DETECTED    Amphetamines NONE DETECTED  NONE DETECTED    Tetrahydrocannabinol NONE DETECTED  NONE DETECTED    Barbiturates NONE DETECTED  NONE DETECTED    Ct Head Wo Contrast  06/27/2011  *RADIOLOGY REPORT*  Clinical Data: Head trauma.  CT HEAD WITHOUT CONTRAST  Technique:  Contiguous axial images were obtained from the base of the skull through the vertex without contrast.  Comparison: 05/03/2011   Findings: There is no acute intracranial hemorrhage, infarction, or mass.  Brain parenchyma is normal.  Osseous structures are normal.  IMPRESSION: Normal exam.  Original Report Authenticated By: Gwynn Burly, M.D.   Ct Cervical Spine Wo Contrast  06/27/2011  *RADIOLOGY REPORT*  Clinical Data: Head trauma.  CT CERVICAL SPINE WITHOUT CONTRAST  Technique:  Multidetector CT imaging of the cervical spine was performed. Multiplanar CT image reconstructions were also generated.  Comparison: 04/10/2011  Findings: There is no fracture, acute subluxation, prevertebral soft tissue swelling, or other significant acute abnormality. There is fairly severe degenerative disc disease at C4-5. Multilevel facet  arthritis.  IMPRESSION: No acute abnormality of the cervical spine.  The study is somewhat degraded by motion artifact because the patient was agitated.  Original Report Authenticated By: Gwynn Burly, M.D.      MDM  Labs. Act team called.   Ativan 1 mg po. Reassurance provided. Pt calmer. Alert.   Discussed w act team - eval pending.   kcl po, meal.   Recheck pt, calm, alert, no tremor or shakes.   Pt ambulates w steady gait.  Act team has evaluated. Pt declines/refuses inpatient etoh rehab/detox, and inpt psych tx. Pt states although feels depressed at times, is able to manage and feels depression largely due to etoh abuse. Pt requests d/c, but states will use output resources.   I again evaluated, encouraged inpt tx, pt again declines inpatient treatment. Pt is alert, oriented, normal mood/affect. Denies any thoughts of harm to self or others. Requests d/c.     Suzi Roots, MD 06/28/11 1409

## 2011-06-28 NOTE — ED Notes (Signed)
Patient requesting a sandwich before tray arrives. [place] sandwich given w/drink

## 2011-06-28 NOTE — ED Notes (Signed)
Pt not in room at this time

## 2011-06-28 NOTE — ED Provider Notes (Addendum)
History     CSN: [id number]  Arrival date & time 06/28/11  2023   First MD Initiated Contact with Patient 06/28/11 2129      Chief Complaint  Patient presents with  . Alcohol Intoxication    (Consider location/radiation/quality/duration/timing/severity/associated sxs/prior treatment) HPI  Patient with hx of multiple visits to the ER for alcohol abuse presents to ER by EMS with EMS called because patient was seen in a parking lot stumbling, intoxicated appearing. Patient is hard to understand due to slurring of speech but admits to "drinking a lot tonight." patient has no physical complaint. Level 5 caveat applies due to intoxication. When asked if hurt or in pain, patient responds "no."   Past Medical History  Diagnosis Date  . Back pain   . Neck pain   . Bipolar 1 disorder   . ETOH abuse     History reviewed. No pertinent past surgical history.  History reviewed. No pertinent family history.  History  Substance Use Topics  . Smoking status: Never Smoker   . Smokeless tobacco: Not on file  . Alcohol Use: Yes     ETOH abuse      Review of Systems  All other systems reviewed and are negative.    Allergies  Hydrocodone  Home Medications   Current Outpatient Rx  Name Route Sig Dispense Refill  . ALPRAZOLAM 0.25 MG PO TABS Oral Take 0.25 mg by mouth at bedtime as needed.    . CHLORDIAZEPOXIDE HCL 25 MG PO CAPS Oral Take 1 capsule (25 mg total) by mouth 3 (three) times daily as needed for anxiety. 20 capsule 0  . CITALOPRAM HYDROBROMIDE 10 MG PO TABS Oral Take 10 mg by mouth daily.    . QUETIAPINE FUMARATE 100 MG PO TABS Oral Take 100 mg by mouth at bedtime.      BP 106/75  Pulse 109  Temp(Src) 99.2 F (37.3 C) (Oral)  SpO2 97%  Physical Exam  Nursing note and vitals reviewed. Constitutional: He appears well-developed and well-nourished. No distress.       Poor hygiene. Smells of alcohol.   HENT:  Head: Normocephalic and atraumatic.       atraumatic and  non tender.   Eyes: Conjunctivae and EOM are normal. Pupils are equal, round, and reactive to light.  Neck: Normal range of motion. Neck supple.  Cardiovascular: Normal rate, regular rhythm, normal heart sounds and intact distal pulses.  Exam reveals no gallop and no friction rub.   No murmur heard. Pulmonary/Chest: Effort normal and breath sounds normal. No respiratory distress. He has no wheezes. He has no rales. He exhibits no tenderness.  Abdominal: Soft. Bowel sounds are normal. He exhibits no distension and no mass. There is no tenderness. There is no rebound and no guarding.  Musculoskeletal: Normal range of motion. He exhibits no edema and no tenderness.  Neurological: He is alert.       Alert to self and place  Skin: Skin is warm and dry. No rash noted. He is not diaphoretic. No erythema.  Psychiatric: He has a normal mood and affect.       Slurring of words and intoxicated appearing    ED Course  Procedures (including critical care time)  Temp psych orders written. CIWA started. ACT team evaluation vs provider reassessment once patient sobers  Labs Reviewed  CBC - Abnormal; Notable for the following:    RBC 4.17 (*)    Hemoglobin 12.9 (*)    HCT 38.0 (*)  All other components within normal limits  COMPREHENSIVE METABOLIC PANEL - Abnormal; Notable for the following:    Sodium 148 (*)    Glucose, Bld 108 (*)    AST 40 (*)    Total Bilirubin 0.2 (*)    All other components within normal limits  ETHANOL - Abnormal; Notable for the following:    Alcohol, Ethyl (B) 303 (*)    All other components within normal limits  URINE RAPID DRUG SCREEN (HOSP PERFORMED)   Ct Head Wo Contrast  06/27/2011  *RADIOLOGY REPORT*  Clinical Data: Head trauma.  CT HEAD WITHOUT CONTRAST  Technique:  Contiguous axial images were obtained from the base of the skull through the vertex without contrast.  Comparison: 05/03/2011  Findings: There is no acute intracranial hemorrhage, infarction, or  mass.  Brain parenchyma is normal.  Osseous structures are normal.  IMPRESSION: Normal exam.  Original Report Authenticated By: Gwynn Burly, M.D.   Ct Cervical Spine Wo Contrast  06/27/2011  *RADIOLOGY REPORT*  Clinical Data: Head trauma.  CT CERVICAL SPINE WITHOUT CONTRAST  Technique:  Multidetector CT imaging of the cervical spine was performed. Multiplanar CT image reconstructions were also generated.  Comparison: 04/10/2011  Findings: There is no fracture, acute subluxation, prevertebral soft tissue swelling, or other significant acute abnormality. There is fairly severe degenerative disc disease at C4-5. Multilevel facet arthritis.  IMPRESSION: No acute abnormality of the cervical spine.  The study is somewhat degraded by motion artifact because the patient was agitated.  Original Report Authenticated By: Gwynn Burly, M.D.     1. Alcohol intoxication       MDM  Sign out given to Dr. Norlene Campbell. Patient remains intoxicated appearing. Will monitor over night and reassess for disposition  in morning when sober. Jenness Corner, PA 06/29/11 0110  Jenness Corner, PA 06/29/11 [phone]

## 2011-06-28 NOTE — Discharge Instructions (Signed)
Alcohol Intoxication Alcohol intoxication means your blood alcohol level is above legal limits. Alcohol is a drug. It has serious side effects. These side effects can include:  Damage to your organs (liver, nervous system, and blood system).   Unclear thinking.   Slowed reflexes.   Decreased muscle coordination.  HOME CARE  Do not drink and drive.   Do not drink alcohol if you are taking medicine or using other drugs. Doing so can cause serious medical problems or even death.   Drink enough water and fluids to keep your pee (urine) clear or pale yellow.   Eat healthy foods.   Only take medicine as told by your doctor.   Join an alcohol support group.  GET HELP RIGHT AWAY IF:  You become shaky when you stop drinking.   Your thinking is unclear or you become confused.   You throw up (vomit) blood. It may look bright red or like coffee grounds.   You notice blood in your poop (bowel movements).   You become lightheaded or pass out (faint).  MAKE SURE YOU:   Understand these instructions.   Will watch your condition.   Will get help right away if you are not doing well or get worse.  Document Released: 08/25/2007 Document Revised: 02/25/2011 Document Reviewed: 08/25/2009 [facility] Patient Information 2012 [place], [place].

## 2011-06-28 NOTE — ED Notes (Signed)
Report given to Marlise Eves

## 2011-06-28 NOTE — ED Notes (Signed)
Pt continues to go in and out of consultation room to lobby and back

## 2011-06-28 NOTE — ED Notes (Signed)
Pt presenting to ed with c/o wanting detox and per phone call from girlfriend pt is suicidal pt denies suicidal ideation at this time. Pt states he has to drink every day and he needs help. gpd with pt

## 2011-06-28 NOTE — ED Notes (Signed)
Belongings returned to patient and is now getting dressed.

## 2011-06-28 NOTE — Discharge Instructions (Signed)
Do not use alcohol - follow up with AA and use resource guide provided. You may take librium as prescribed, as need, for symptom relief - no driving for the next 8 hours or when drinking alcohol or when taking librium.  Also follow up with mental health provider/[facility] in the next couple days.  Return to ER right away if worse, severe depression, thoughts of self harm, reconsider and want inpatient treatment, other concern.  From your lab work, your potassium level is low - eat plenty of fruits and vegetables, and follow up with primary care doctor for recheck in 1 week.      RESOURCE GUIDE  Dental Problems  Patients with Medicaid: [facility] [phone]                                           [phone] Phone:  [phone]                                                  Phone:  [phone]  If unable to pay or uninsured, contact:  [facility] or [facility] to become qualified for the adult dental clinic.  Chronic Pain Problems Contact [facility]  [phone] Patients need to be referred by their primary care doctor.  Insufficient Money for Medicine Contact [employer]:  call "211" or [facility] 7803140901.  No Primary Care Doctor Call [facility]  [phone] Other agencies that provide inexpensive medical care    [facility]  [phone]    [facility]  [phone]    [facility]  [phone]    [facility]  [phone]    [facility]  [phone]    [facility]  [phone]  Psychological Services [facility]  [phone] [facility]  [phone] [facility]   [phone] (emergency services [phone])  Substance Abuse Resources [facility]  [phone] [facility] [phone] [facility] [phone] Floydene Flock [phone] Residential  & Outpatient Substance Abuse Program  [phone]  Abuse/Neglect [facility] Child Abuse Hotline [phone] [facility] Child Abuse Hotline [phone] (After Hours)  Emergency Shelter [facility] [phone]  [facility] [phone] [facility] [phone]  MRSA Hotline #:   979-492-0437    [facility] Resources  [facility]     [employer]                          [facility] Dept. [address] [name]                       [address]      [address]  [address]  Cristobal Goldmann Phone:  [phone]                                   Phone:  [phone]                 Phone:  [phone]  [facility] Phone:  [phone]  [facility] Child Abuse Hotline [phone] [phone] (After Hours)         Alcohol Intoxication You have alcohol intoxication when the amount of alcohol that you have consumed has impaired your ability to mentally and physically function. There are a variety of factors that contribute to the level at which alcohol intoxication can occur, such as age, gender, weight, frequency of alcohol consumption, medication use, and the presence of other medical conditions, such as diabetes, seizures, or heart conditions. The blood alcohol level test measures the concentration of alcohol in your blood. In most states, your blood alcohol level must be lower than 80 mg/dL (4.33%) to legally drive. However, many dangerous effects of alcohol can occur at much lower levels. Alcohol directly impairs the normal chemical activity of the brain and is said to be a chemical depressant. Alcohol can cause drowsiness, stupor, respiratory failure, and coma. Other physical effects can include headache, vomiting, vomiting of blood, abdominal pain, a  fast heartbeat, difficulty breathing, anxiety, and amnesia. Alcohol intoxication can also lead to dangerous and life-threatening activities, such as fighting, dangerous operation of vehicles or heavy machinery, and risky sexual behavior. Alcohol can be especially dangerous when taken with other drugs. Some of these drugs are:  Sedatives.   Painkillers.   Marijuana.   Tranquilizers.   Antihistamines.   Muscle relaxants.   Seizure medicine.  Many of the effects of acute alcohol intoxication are temporary. However, repeated alcohol intoxication can lead to severe medical illnesses. If you have alcohol intoxication, you should:  Stay hydrated. Drink enough water and fluids to keep your urine clear or pale yellow. Avoid excessive caffeine because this can further lead to dehydration.   Eat a healthy diet. You may have residual nausea, headache, and loss of appetite, but it is still important that you maintain good nutrition. You can start with clear liquids.   Take nonsteroidal anti-inflammatory medications as needed for headaches, but make sure to do so with small meals. You should avoid acetaminophen for several days after having alcohol intoxication because the combination of alcohol and acetaminophen can be toxic to your liver.  If you have frequent alcohol intoxication, ask your friends and family if they think you have a drinking problem. For further help, contact:  Your caregiver.   Alcoholics Anonymous (AA).   A drug or alcohol rehabilitation program.  SEEK MEDICAL CARE IF:   You have persistent vomiting.   You have persistent pain in any part of your body.   You do not feel better after a few days.  SEEK IMMEDIATE MEDICAL CARE IF:   You become shaky or tremble when you try to stop drinking.   You shake uncontrollably (seizure).   You throw up (vomit) blood. This may be bright red or it may look like black coffee grounds.   You have blood in the stool. This may be  bright red or appear as a black,  tarry, bad smelling stool.   You become lightheaded or faint.  ANY OF THESE SYMPTOMS MAY REPRESENT A SERIOUS PROBLEM THAT IS AN EMERGENCY. Do not wait to see if the symptoms will go away. Get medical help right away. Call your local emergency services (911 in [place] DO NOT drive yourself to the hospital. MAKE SURE YOU:   Understand these instructions.   Will watch your condition.   Will get help right away if you are not doing well or get worse.  Document Released: 12/16/2004 Document Revised: 02/25/2011 Document Reviewed: 08/25/2009 [facility] Patient Information 2012 [place], [place].       Alcohol Problems Most adults who drink alcohol drink in moderation (not a lot) are at low risk for developing problems related to their drinking. However, all drinkers, including low-risk drinkers, should know about the health risks connected with drinking alcohol. RECOMMENDATIONS FOR LOW-RISK DRINKING  Drink in moderation. Moderate drinking is defined as follows:   Men - no more than 2 drinks per day.   Nonpregnant women - no more than 1 drink per day.   Over age 49 - no more than 1 drink per day.  A standard drink is 12 grams of pure alcohol, which is equal to a 12 ounce bottle of beer or wine cooler, a 5 ounce glass of wine, or 1.5 ounces of distilled spirits (such as whiskey, brandy, vodka, or rum).  ABSTAIN FROM (DO NOT DRINK) ALCOHOL:  When pregnant or considering pregnancy.   When taking a medication that interacts with alcohol.   If you are alcohol dependent.   A medical condition that prohibits drinking alcohol (such as ulcer, liver disease, or heart disease).  DISCUSS WITH YOUR CAREGIVER:  If you are at risk for coronary heart disease, discuss the potential benefits and risks of alcohol use: Light to moderate drinking is associated with lower rates of coronary heart disease in certain populations (for example, men over age 27 and postmenopausal  women). Infrequent or nondrinkers are advised not to begin light to moderate drinking to reduce the risk of coronary heart disease so as to avoid creating an alcohol-related problem. Similar protective effects can likely be gained through proper diet and exercise.   Women and the elderly have smaller amounts of body water than men. As a result women and the elderly achieve a higher blood alcohol concentration after drinking the same amount of alcohol.   Exposing a fetus to alcohol can cause a broad range of birth defects referred to as Fetal Alcohol Syndrome (FAS) or Alcohol-Related Birth Defects (ARBD). Although FAS/ARBD is connected with excessive alcohol consumption during pregnancy, studies also have reported neurobehavioral problems in infants born to mothers reporting drinking an average of 1 drink per day during pregnancy.   Heavier drinking (the consumption of more than 4 drinks per occasion by men and more than 3 drinks per occasion by women) impairs learning (cognitive) and psychomotor functions and increases the risk of alcohol-related problems, including accidents and injuries.  CAGE QUESTIONS:   Have you ever felt that you should Cut down on your drinking?   Have people Annoyed you by criticizing your drinking?   Have you ever felt bad or Guilty about your drinking?   Have you ever had a drink first thing in the morning to steady your nerves or get rid of a hangover (Eye opener)?  If you answered positively to any of these questions: You may be at risk for alcohol-related problems if alcohol consumption is:   Men:  Greater than 14 drinks per week or more than 4 drinks per occasion.   Women: Greater than 7 drinks per week or more than 3 drinks per occasion.  Do you or your family have a medical history of alcohol-related problems, such as:  Blackouts.   Sexual dysfunction.   Depression.   Trauma.   Liver dysfunction.   Sleep disorders.   Hypertension.   Chronic  abdominal pain.   Has your drinking ever caused you problems, such as problems with your family, problems with your work (or school) performance, or accidents/injuries?   Do you have a compulsion to drink or a preoccupation with drinking?   Do you have poor control or are you unable to stop drinking once you have started?   Do you have to drink to avoid withdrawal symptoms?   Do you have problems with withdrawal such as tremors, nausea, sweats, or mood disturbances?   Does it take more alcohol than in the past to get you high?   Do you feel a strong urge to drink?   Do you change your plans so that you can have a drink?   Do you ever drink in the morning to relieve the shakes or a hangover?  If you have answered a number of the previous questions positively, it may be time for you to talk to your caregivers, family, and friends and see if they think you have a problem. Alcoholism is a chemical dependency that keeps getting worse and will eventually destroy your health and relationships. Many alcoholics end up dead, impoverished, or in prison. This is often the end result of all chemical dependency.  Do not be discouraged if you are not ready to take action immediately.   Decisions to change behavior often involve up and down desires to change and feeling like you cannot decide.   Try to think more seriously about your drinking behavior.   Think of the reasons to quit.  WHERE TO GO FOR ADDITIONAL INFORMATION   The National Institute on Alcohol Abuse and Alcoholism (NIAAA)www.niaaa.nih.gov   [employer] on Alcoholism and Drug Dependence (NCADD)www.ncadd.org   American Society of Addiction Medicine (ASAM)www.https://anderson-johnson.com/  Document Released: 03/08/2005 Document Revised: 02/25/2011 Document Reviewed: 10/25/2007 [facility] Patient Information 2012 [place], [place].      Alcohol and Nutrition Nutrition serves two purposes. It provides energy. It also maintains body structure and  function. Food supplies energy. It also provides the building blocks needed to replace worn or damaged cells. Alcoholics often eat poorly. This limits their supply of essential nutrients. This affects energy supply and structure maintenance. Alcohol also affects the body's nutrients in:  Digestion.   Storage.   Using and getting rid of waste products.  IMPAIRMENT OF NUTRIENT DIGESTION AND UTILIZATION   Once ingested, food must be broken down into small components (digested). Then it is available for energy. It helps maintain body structure and function. Digestion begins in the mouth. It continues in the stomach and intestines, with help from the pancreas. The nutrients from digested food are absorbed from the intestines into the blood. Then they are carried to the liver. The liver prepares nutrients for:   Immediate use.   Storage and future use.   Alcohol inhibits the breakdown of nutrients into usable molecules.   It decreases secretion of digestive enzymes from the pancreas.   Alcohol impairs nutrient absorption by damaging the cells lining the stomach and intestines.   It also interferes with moving some nutrients into the blood.  In addition, nutritional deficiencies themselves may lead to further absorption problems.   For example, folate deficiency changes the cells that line the small intestine. This impairs how water is absorbed. It also affects absorbed nutrients. These include glucose, sodium, and additional folate.   Even if nutrients are digested and absorbed, alcohol can prevent them from being fully used. It changes their transport, storage, and excretion. Impaired utilization of nutrients by alcoholics is indicated by:   Decreased liver stores of vitamins, such as vitamin A.   Increased excretion of nutrients such as fat.  ALCOHOL AND ENERGY SUPPLY   Three basic nutritional components found in food are:   Carbohydrates.   Proteins.   Fats.   These are used  as energy. Some alcoholics take in as much as 50% of their total daily calories from alcohol. They often neglect important foods.   Even when enough food is eaten, alcohol can impair the ways the body controls blood sugar (glucose) levels. It may either increase or decrease blood sugar.   In non-diabetic alcoholics, increased blood sugar (hyperglycemia) is caused by poor insulin secretion. It is usually temporary.   Decreased blood sugar (hypoglycemia) can cause serious injury even if this condition is short-lived. Low blood sugar can happen when a fasting or malnourished person drinks alcohol. When there is no food to supply energy, stored sugar is used up. The products of alcohol inhibit forming glucose from other compounds such as amino acids. As a result, alcohol causes the brain and other body tissue to lack glucose. It is needed for energy and function.   Alcohol is an energy source. But how the body processes and uses the energy from alcohol is complex. Also, when alcohol is substituted for carbohydrates, subjects tend to lose weight. This indicates that they get less energy from alcohol than from food.  ALCOHOL - MAINTAINING CELL STRUCTURE AND FUNCTION  Structure Cells are made mostly of protein. So an adequate protein diet is important for maintaining cell structure. This is especially true if cells are being damaged. Research indicates that alcohol affects protein nutrition by causing impaired:  Digestion of proteins to amino acids.   Processing of amino acids by the small intestine and liver.   Synthesis of proteins from amino acids.   Protein secretion by the liver.  Function Nutrients are essential for the body to function well. They provide the tools that the body needs to work well:   Proteins.   Vitamins.   Minerals.  Alcohol can disrupt body function. It may cause nutrient deficiencies. And it may interfere with the way nutrients are processed. Vitamins  Vitamins are  essential to maintain growth and normal metabolism. They regulate many of the body`s processes. Chronic heavy drinking causes deficiencies in many vitamins. This is caused by eating less. And, in some cases, vitamins may be poorly absorbed. For example, alcohol inhibits fat absorption. It impairs how the vitamins A, E, and D are normally absorbed along with dietary fats. Not enough vitamin A may cause night blindness. Not enough vitamin D may cause softening of the bones.   Some alcoholics lack vitamins A, C, D, E, K, and the B vitamins. These are all involved in wound healing and cell maintenance. In particular, because vitamin K is necessary for blood clotting, lacking that vitamin can cause delayed clotting. The result is excess bleeding. Lacking other vitamins involved in brain function may cause severe neurological damage.  Minerals Deficiencies of minerals such as calcium, magnesium, iron, and  zinc are common in alcoholics. The alcohol itself does not seem to affect how these minerals are absorbed. Rather, they seem to occur secondary to other alcohol-related problems, such as:  Less calcium absorbed.   Not enough magnesium.   More urinary excretion.   Vomiting.   Diarrhea.   Not enough iron due to gastrointestinal bleeding.   Not enough zinc or losses related to other nutrient deficiencies.   Mineral deficiencies can cause a variety of medical consequences. These range from calcium-related bone disease to zinc-related night blindness and skin lesions.  ALCOHOL, MALNUTRITION, AND MEDICAL COMPLICATIONS  Liver Disease   Alcoholic liver damage is caused primarily by alcohol itself. But poor nutrition may increase the risk of alcohol-related liver damage. For example, nutrients normally found in the liver are known to be affected by drinking alcohol. These include carotenoids, which are the major sources of vitamin A, and vitamin E compounds. Decreases in such nutrients may play some role  in alcohol-related liver damage.  Pancreatitis  Research suggests that malnutrition may increase the risk of developing alcoholic pancreatitis. Research suggests that a diet lacking in protein may increase alcohol's damaging effect on the pancreas.  Brain  Nutritional deficiencies may have severe effects on brain function. These may be permanent. Specifically, thiamine deficiencies are often seen in alcoholics. They can cause severe neurological problems. These include:   Impaired movement.   Memory loss seen in Wernicke-Korsakoff syndrome.  Pregnancy  Alcohol has toxic effects on fetal development. It causes alcohol-related birth defects. They include fetal alcohol syndrome. Alcohol itself is toxic to the fetus. Also, the nutritional deficiency can affect how the fetus develops. That may compound the risk of developmental damage.   Nutritional needs during pregnancy are 10% to 30% greater than normal. Food intake can increase by as much as 140% to cover the needs of both mother and fetus. An alcoholic mother`s nutritional problems may adversely affect the nutrition of the fetus. And alcohol itself can also restrict nutrition flow to the fetus.  NUTRITIONAL STATUS OF ALCOHOLICS  Techniques for assessing nutritional status include:  Taking body measurements to estimate fat reserves. They include:   Weight.   Height.   Mass.   Skin fold thickness.   Performing blood analysis to provide measurements of circulating:   Proteins.   Vitamins.   Minerals.   These techniques tend to be imprecise. For many nutrients, there is no clear "cut-off" point that would allow an accurate definition of deficiency. So assessing the nutritional status of alcoholics is limited by these techniques. Dietary status may provide information about the risk of developing nutritional problems. Dietary status is assessed by:   Taking patients' dietary histories.   Evaluating the amount and types of food  they are eating.   It is difficult to determine what exact amount of alcohol begins to have damaging effects on nutrition. In general, moderate drinkers have 2 drinks or less per day. They seem to be at little risk for nutritional problems. Various medical disorders begin to appear at greater levels.   Research indicates that the majority of even the heaviest drinkers have few obvious nutritional deficiencies. Many alcoholics who are hospitalized for medical complications of their disease do have severe malnutrition. Alcoholics tend to eat poorly. Often they eat less than the amounts of food necessary to provide enough:   Carbohydrates.   Protein.   Fat.   Vitamins A and C.   B vitamins.   Minerals like calcium and iron.  Of  major concern is alcohol's effect on digesting food and use of nutrients. It may shift a mildly malnourished person toward severe malnutrition. Document Released: 12/31/2004 Document Revised: 02/25/2011 Document Reviewed: 06/16/2005 [facility] Patient Information 2012 [place], [place].      Hypokalemia Hypokalemia means a low potassium level in the blood.Potassium is an electrolyte that helps regulate the amount of fluid in the body. It also stimulates muscle contraction and maintains a stable acid-base balance.Most of the body's potassium is inside of cells, and only a very small amount is in the blood. Because the amount in the blood is so small, minor changes can have big effects. PREPARATION FOR TEST Testing for potassium requires taking a blood sample taken by needle from a vein in the arm. The skin is cleaned thoroughly before the sample is drawn. There is no other special preparation needed. NORMAL VALUES Potassium levels below 3.5 mEq/L are abnormally low. Levels above 5.1 mEq/L are abnormally high. Ranges for normal findings may vary among different laboratories and hospitals. You should always check with your doctor after having lab work or other tests  done to discuss the meaning of your test results and whether your values are considered within normal limits. MEANING OF TEST  Your caregiver will go over the test results with you and discuss the importance and meaning of your results, as well as treatment options and the need for additional tests, if necessary. A potassium level is frequently part of a routine medical exam. It is usually included as part of a whole "panel" of tests for several blood salts (such as Sodium and Chloride). It may be done as part of follow-up when a low potassium level was found in the past or other blood salts are suspected of being out of balance. A low potassium level might be suspected if you have one or more of the following:  Symptoms of weakness.   Abnormal heart rhythms.   High blood pressure and are taking medication to control this, especially water pills (diuretics).   Kidney disease that can affect your potassium level .   Diabetes requiring the use of insulin. The potassium may fall after taking insulin, especially if the diabetes had been out of control for a while.   A condition requiring the use of cortisone-type medication or certain types of antibiotics.   Vomiting and/or diarrhea for more than a day or two.   A stomach or intestinal condition that may not permit appropriate absorption of potassium.   Fainting episodes.   Mental confusion.  OBTAINING TEST RESULTS It is your responsibility to obtain your test results. Ask the lab or department performing the test when and how you will get your results.  Please contact your caregiver directly if you have not received the results within one week. At that time, ask if there is anything different or new you should be doing in relation to the results. TREATMENT Hypokalemia can be treated with potassium supplements taken by mouth and/or adjustments in your current medications. A diet high in potassium is also helpful. Foods with high potassium  content are:  Peas, lentils, lima beans, nuts, and dried fruit.   Whole grain and bran cereals and breads.   Fresh fruit, vegetables (bananas, cantaloupe, grapefruit, oranges, tomatoes, honeydew melons, potatoes).   Orange and tomato juices.   Meats. If potassium supplement has been prescribed for you today or your medications have been adjusted, see your personal caregiver in time02 for a re-check.  SEEK MEDICAL CARE IF:  There is a feeling of worsening weakness.   You experience repeated chest palpitations.   You are diabetic and having difficulty keeping your blood sugars in the normal range.   You are experiencing vomiting and/or diarrhea.   You are having difficulty with any of your regular medications.  SEEK IMMEDIATE MEDICAL CARE IF:  You experience chest pain, shortness of breath, or episodes of dizziness.   You have been having vomiting or diarrhea for more than 2 days.   You have a fainting episode.  MAKE SURE YOU:   Understand these instructions.   Will watch your condition.   Will get help right away if you are not doing well or get worse.  Document Released: 03/08/2005 Document Revised: 02/25/2011 Document Reviewed: 02/17/2008 [facility] Patient Information 2012 [place], [place].     Depression, Adolescent and Adult Depression is a true and treatable medical condition. In general there are two kinds of depression:  Depression we all experience in some form. For example depression from the death of a loved one, financial distress or natural disasters will trigger or increase depression.   Clinical depression, on the other hand, appears without an apparent cause or reason. This depression is a disease. Depression may be caused by chemical imbalance in the body and brain or may come as a response to a physical illness. Alcohol and other drugs can cause depression.  DIAGNOSIS  The diagnosis of depression is usually based upon symptoms and medical  history. TREATMENT  Treatments for depression fall into three categories. These are:  Drug therapy. There are many medicines that treat depression. Responses may vary and sometimes trial and error is necessary to determine the best medicines and dosage for a particular patient.   Psychotherapy, also called talking treatments, helps people resolve their problems by looking at them from a different point of view and by giving people insight into their own personal makeup. Traditional psychotherapy looks at a childhood source of a problem. Other psychotherapy will look at current conflicts and move toward solving those. If the cause of depression is drug use, counseling is available to help abstain. In time the depression will usually improve. If there were underlying causes for the chemical use, they can be addressed.   ECT (electroconvulsive therapy) or shock treatment is not as commonly used today. It is a very effective treatment for severe suicidal depression. During ECT electrical impulses are applied to the head. These impulses cause a generalized seizure. It can be effective but causes a loss of memory for recent events. Sometimes this loss of memory may include the last several months.  Treat all depression or suicide threats as serious. Obtain professional help. Do not wait to see if serious depression will get better over time without help. Seek help for yourself or those around you. In the [place] the number to the National Suicide Help Lines With 24 Hour Help Are: 1-800-SUICIDE 620 035 8345 Document Released: 03/05/2000 Document Revised: 02/25/2011 Document Reviewed: 10/25/2007 [facility] Patient Information 2012 [place], [place].

## 2011-06-28 NOTE — ED Notes (Signed)
Pt back in room talking with PA  Pt does not want to stay and will call for a ride home  Pt cursing in room

## 2011-06-28 NOTE — ED Notes (Signed)
Per EMS pt was wandering in parking lot falling around on sidewalk  Pt admits to drinking an unknown amt of liquor tonight  Denies pain

## 2011-06-28 NOTE — ED Notes (Signed)
Patient arrived in blue scrubs and red socks. Patient received a phone call from his girlfriend and was tearful afterwards. Regular diet ordered by ED Secretary

## 2011-06-29 NOTE — ED Provider Notes (Signed)
Medical screening examination/treatment/procedure(s) were conducted as a shared visit with non-physician practitioner(s) and myself.  I personally evaluated the patient during the encounter  Olivia Mackie, MD 06/29/11 [phone]

## 2011-06-29 NOTE — ED Provider Notes (Addendum)
Medical screening examination/treatment/procedure(s) were conducted as a shared visit with non-physician practitioner(s) and myself.  I personally evaluated the patient during the encounter. Pt signed out to dr Norlene Campbell  125 am  Doug Sou, MD 06/29/11 Josefa Half  Doug Sou, MD 06/29/11 [phone]

## 2011-06-29 NOTE — ED Notes (Signed)
Received pt. From triage, pt. Resting on stretcher

## 2011-06-29 NOTE — Discharge Instructions (Signed)
Drunk  You drank to the point of becoming unconscious.  This is very dangerous, as you could easily die from choking on your own vomit or alcohol toxicity.  You will need to be very careful in the future to not drink to excess.  Expect to have a headache today, and to have nausea and vomiting.  This is good, to continue to purge the alcohol from your system.  Stick to a bland diet, drink plenty of water, but go slow.   ALCOHOL INTOXICATION  ALCOHOL INTOXICATION: You have been seen for intoxication with alcohol.  The use of alcohol in a manner such that you ended up here today strongly suggests that you may have a problem with alcohol abuse.  The abuse of alcohol can cause many chronic problems including liver disease, stomach ulcers and pancreatitis.  If alcohol is consumed in very large amounts, it can cause you to stop breathing and die.  Fortunately, you did not suffer any life-threatening complications with this episode.  DO NOT DRIVE A VEHICLE UNDER THE INFLUENCE OF ALCOHOL! YOU MAY INJURE OR KILL YOURSELF OR SOMEONE ELSE IF YOU DRINK AND DRIVE.  YOU SHOULD SEEK MEDICAL ATTENTION IMMEDIATELY, EITHER HERE OR AT THE NEAREST EMERGENCY DEPARTMENT, IF ANY OF THE FOLLOWING OCCURS:    You have any other episodes of alcohol intoxication in which you drink enough to have a loss of consciousness ("black outs").   You develop any confusion, lethargy or altered thinking, even while not being intoxicated.   Behavioral Health Resources [facility]   [phone]   [facility] Recovery Services   [phone]  [facility] Crisis                         [phone]   [facility] Mental Health            [place]                            [phone]  [place]                          [phone]  24 hours                              [phone]   [facility]                [phone] 24hr                                     [phone]   [place]                              [phone]   [place]                             [phone]   [facility] 24 hr Access line                   [phone] [employer] health      [phone]   [facility] 24hr [employer]  [phone]   [facility]                                 [phone]   [phone]    *SUBSTANCE ABUSE REFERRAL       SUBSTANCE ABUSE REFERRALS       IN-PATIENT DETOX  Name Address Phone Number  [facility] [address] [place], [place] [phone]  [facility] [address] [place], [place] [phone]  [facility] [address] [place] [phone]  [facility] [place], [place] [phone]  [facility] [address] [place], [place] [phone]        [place]-TERM TREATMENT  [facility]. [address] [facility] [phone]  [facility] [address] Kendell Bane [phone]  [employer] [address] [place], [place] [phone]  [facility] [address] [place] [phone] [place], [place] [phone]  [facility] [address] [place], [place] [phone]  [facility] [address] [place], [place] [phone]  [facility] [address] [place], [place] [phone]  [employer]  [address] [place], [place] [phone]  [facility] [place] [address] [place] [address] [place], [place] [phone]  [facility] [address], [place] [phone]      OUT-PATIENT ALCOHOL AND DRUG TREATMENT  [facility]  [address] [place], [place] [phone]  [facility] [address] [place], [place] [phone]  [facility] [employer]   [address] [place], [place] [phone]  [facility] [address] [place]  [phone]   [place] [phone] [place] [phone]   [place] [phone] Rosalita Levan [phone]  [facility]  [address] [place] [phone]  [facility]  [address] [place] [phone]  [address] [place] [phone]  [facility], [place] [phone]  [facility] (Ages 13-25) [phone] [place] [phone]  [employer] (adolescent) [address] [place] [phone]  Alcoholics Anonymous [place] [employer] [phone] or       [phone]  Narcotics Anonymous www.na.[place] [phone]  [facility] [address] [place], [place] [phone]     *RESOURCE GUIDE  RESOURCE GUIDE  Dental Problems  Patients with Medicaid: [facility] [phone] [address]                                                                   [phone] [address] [employer] [address] Phone:  [phone]  Phone:  [phone]  If unable to pay or uninsured, contact:  [facility] or [facility] to become qualified for the adult dental clinic.  Chronic Pain Problems Contact [facility]  [phone] Patients need to be referred by their primary care doctor.  Insufficient Money for Medicine Contact [employer] Way:  call "211" or [facility] [phone].  No Primary Care Doctor Call [facility]  [phone] Other agencies that provide inexpensive medical care    [facility]  [phone]    [facility] Internal Medicine  [phone]    [facility]  [phone]    [facility]  [phone]    [facility]  [phone]    [facility]  [phone]  Psychological Services [facility] Behavioral Health  [phone] [facility]   [phone] [facility] Mental Health   [phone] (emergency services [phone])  Abuse/Neglect [facility] Child Abuse Hotline [phone])[phone] [facility] Child Abuse Hotline [phone] (After Hours)  Emergency Shelter [facility] Ministries [phone]  [facility]aternity [facility] [place] [facility] [place] [phone] [place] Alert Services 7744857283  MRSA Hotline #:   (431) 597-0084  [facility]  [facility] [place]     United Way                          [facility] Dept. [address] [place]                        [address]          [address] [place] [address]  Blondell Reveal Phone:  [phone]                                     Phone:  [phone]                   Phone:  [phone]  [facility] Mental Health Phone:  [phone]  [facility] Child Abuse Hotline 786-570-3678 [phone]) [phone] (After Hours)      If you develop symptoms of Shortness of Breath, Chest Pain, Swelling of lips, mouth or tongue or if your condition becomes worse with any new symptoms, see your doctor or return to the Emergency Department for immediate care. Emergency services are not intended to be a substitute for comprehensive medical attention.  Please contact your doctor for follow up if not improving as expected.   Call your doctor in 5-7 days or as directed if there is no improvement.   Community Resources: *IF YOU ARE IN IMMEDIATE DANGER CALL 911!  Abuse/Neglect:  Family Services Crisis Hotline [facility]): 9280298089 Center Against Violence [facility]): 5105120058  After hours, holidays and weekends: 262-654-9210 National Domestic Violence Hotline: (208)164-9209  Mental Health: [facility] Mental Health: [address]: [phone]  Health Clinics:  Urgent  Care Center [facility]): [phone] Monday - Friday 8 AM - 9 PM, Saturday and Sunday 10 AM - 9 PM  [facility] [place]: [phone] Monday - Friday 8 AM - 5 PM  [place] [facility]  [address]: [phone] Monday- Friday 8:30 AM - 5:30 PM, Sat 9 AM - 1 PM  24 HR [place] [facility] on [address]: [phone] [facility] on [address]: [phone] [facility] on [address]: [phone]  24 HR [facility] [facility]: [address] [phone]  Cultures: If culture results are positive, we will notify you if a change in treatment is necessary.  LABORATORY TESTS:         If you had any labs drawn in the ED that have not resulted by the time you are discharged home, we will review these lab results and the treatment given to you.  If there is any further treatment or notification needed, we will contact you by phone, or letter.  "PLEASE ENSURE THAT YOU HAVE GIVEN US YOUR CURRENT WORKING PHONE NUMBER AND YOUR CURRENT ADDRESS, so that we can contact you if needed."  RADIOLOGY TESTS:  If the referred physician wants today\'s x-rays, please call the hospital\'s Radiology Department the day before your doctor\'s appointment. [place]     [phone] [place]   [phone] [place]     [phone]  Our doctors and staff appreciate your choosing [place] for your emergency medical care needs. We are here to serve you.   Finding Treatment for Alcohol and Drug Addiction It can be hard to find the right place to get professional treatment. Here are some important things to consider:  There are different types of treatment to choose from.   Some programs are live-in (residential) while others are not (outpatient). Sometimes a combination is offered.   No single type of program is right for everyone.   Most treatment programs involve a combination of education, counseling, and a 12-step, spiritually-based approach.   There are non-spiritually based programs (not 12-step).    Some treatment programs are government sponsored. They are geared for patients without private insurance.   Treatment programs can vary in many respects such as:   Cost and types of insurance accepted.   Types of on-site medical services offered.   Length of stay, setting, and size.   Overall philosophy of treatment.  A person may need specialized treatment or have needs not addressed by all programs. For example, adolescents need treatment appropriate for their age. Other people have secondary disorders that must be managed as well. Secondary conditions can include mental illness, such as depression or diabetes. Often, a period of detoxification from alcohol or drugs is needed. This requires medical supervision and not all programs offer this. THINGS TO CONSIDER WHEN SELECTING A TREATMENT PROGRAM   Is the program certified by the appropriate government agency? Even private programs must be certified and employ certified professionals.   Does the program accept your insurance? If not, can a payment plan be set up?   Is the facility clean, organized, and well run? Do they allow you to speak with graduates who can share their treatment experience with you? Can you tour the facility? Can you meet with staff?   Does the program meet the full range of individual needs?   Does the treatment program address sexual orientation and physical disabilities? Do they provide age, gender, and culturally appropriate treatment services?   Is treatment  available in languages other than English?   Is [place]-term aftercare support or guidance encouraged and provided?   Is assessment of an individual's treatment plan ongoing to ensure it meets changing needs?   Does the program use strategies to encourage reluctant patients to remain in treatment [place] enough to increase the likelihood of success?   Does the program offer counseling (individual or group) and other behavioral therapies?   Does the  program offer medicine as part of the treatment regimen, if needed?   Is there ongoing monitoring of possible relapse? Is there a defined relapse prevention program? Are services or referrals offered to family members to ensure they understand addiction and the recovery process? This would help them support the recovering individual.   Are 12-step meetings held at the center or is transport available for patients to attend outside meetings?  In countries outside of the [place]. and [place], Magazine features editor for contact information for services in your area. Document Released: 02/04/2005 Document Revised: 02/25/2011 Document Reviewed: 08/17/2007 [facility] Patient Information 2012 [place], [place].

## 2011-06-29 NOTE — ED Provider Notes (Addendum)
1:15 patient alert mildly argumentative ambulates without difficulty. Appears mildly intoxicated  Doug Sou, MD 06/29/11 0121  7:33 AM Patient has sobered is able to ambulate around the department without difficulty. He is calling for a ride home we'll discharge. Patient does not want help at this time for his alcohol abuse  Olivia Mackie, MD 06/29/11 [phone]

## 2011-06-29 NOTE — ED Notes (Signed)
Pt says he just wants a bed to sleep his drunk off

## 2011-06-30 ENCOUNTER — Encounter (HOSPITAL_COMMUNITY): Payer: Self-pay | Admitting: *Deleted

## 2011-06-30 ENCOUNTER — Emergency Department (HOSPITAL_COMMUNITY)
Admission: EM | Admit: 2011-06-30 | Discharge: 2011-06-30 | Disposition: A | Discharge status: Home / Self Care | Payer: Self-pay | Attending: Emergency Medicine | Admitting: Emergency Medicine

## 2011-06-30 DIAGNOSIS — R000 Tachycardia, unspecified: Secondary | ICD-10-CM | POA: Insufficient documentation

## 2011-06-30 DIAGNOSIS — R110 Nausea: Secondary | ICD-10-CM | POA: Insufficient documentation

## 2011-06-30 DIAGNOSIS — F1010 Alcohol abuse, uncomplicated: Secondary | ICD-10-CM | POA: Insufficient documentation

## 2011-06-30 DIAGNOSIS — R1013 Epigastric pain: Secondary | ICD-10-CM | POA: Insufficient documentation

## 2011-06-30 DIAGNOSIS — F10929 Alcohol use, unspecified with intoxication, unspecified: Secondary | ICD-10-CM

## 2011-06-30 DIAGNOSIS — F319 Bipolar disorder, unspecified: Secondary | ICD-10-CM | POA: Insufficient documentation

## 2011-06-30 DIAGNOSIS — Z79899 Other long term (current) drug therapy: Secondary | ICD-10-CM | POA: Insufficient documentation

## 2011-06-30 DIAGNOSIS — IMO0002 Reserved for concepts with insufficient information to code with codable children: Secondary | ICD-10-CM | POA: Insufficient documentation

## 2011-06-30 DIAGNOSIS — X58XXXA Exposure to other specified factors, initial encounter: Secondary | ICD-10-CM | POA: Insufficient documentation

## 2011-06-30 DIAGNOSIS — S0990XA Unspecified injury of head, initial encounter: Secondary | ICD-10-CM | POA: Insufficient documentation

## 2011-06-30 LAB — BASIC METABOLIC PANEL
CO2: 26 mEq/L (ref 19–32)
Chloride: 113 mEq/L — ABNORMAL HIGH (ref 96–112)
Glucose, Bld: 110 mg/dL — ABNORMAL HIGH (ref 70–99)
Potassium: 3.4 mEq/L — ABNORMAL LOW (ref 3.5–5.1)
Sodium: 150 mEq/L — ABNORMAL HIGH (ref 135–145)

## 2011-06-30 LAB — RAPID URINE DRUG SCREEN, HOSP PERFORMED
Amphetamines: NOT DETECTED
Cocaine: NOT DETECTED
Opiates: NOT DETECTED
Tetrahydrocannabinol: NOT DETECTED

## 2011-06-30 LAB — DIFFERENTIAL
Basophils Absolute: 0 10*3/uL (ref 0.0–0.1)
Lymphocytes Relative: 43 % (ref 12–46)
Lymphs Abs: 2.2 10*3/uL (ref 0.7–4.0)
Neutro Abs: 2.4 10*3/uL (ref 1.7–7.7)
Neutrophils Relative %: 47 % (ref 43–77)

## 2011-06-30 LAB — CBC
MCV: 89.1 fL (ref 78.0–100.0)
Platelets: 193 10*3/uL (ref 150–400)
RBC: 3.85 MIL/uL — ABNORMAL LOW (ref 4.22–5.81)
RDW: 14.3 % (ref 11.5–15.5)
WBC: 5.1 10*3/uL (ref 4.0–10.5)

## 2011-06-30 LAB — ETHANOL: Alcohol, Ethyl (B): 283 mg/dL — ABNORMAL HIGH (ref 0–11)

## 2011-06-30 MED ORDER — SODIUM CHLORIDE 0.9 % IV BOLUS (SEPSIS)
1000.0000 mL | Freq: Once | INTRAVENOUS | Status: AC
  Administered 2011-06-30: 1000 mL via INTRAVENOUS

## 2011-06-30 MED ORDER — THIAMINE HCL 100 MG/ML IJ SOLN
INTRAVENOUS | Status: AC
  Administered 2011-06-30: 15:00:00 via INTRAVENOUS
  Filled 2011-06-30: qty 1000

## 2011-06-30 MED ORDER — ONDANSETRON HCL 4 MG/2ML IJ SOLN
4.0000 mg | Freq: Once | INTRAMUSCULAR | Status: AC
  Administered 2011-06-30: 4 mg via INTRAVENOUS
  Filled 2011-06-30: qty 2

## 2011-06-30 MED ORDER — PANTOPRAZOLE SODIUM 40 MG IV SOLR
40.0000 mg | Freq: Once | INTRAVENOUS | Status: AC
  Administered 2011-06-30: 40 mg via INTRAVENOUS
  Filled 2011-06-30: qty 40

## 2011-06-30 NOTE — ED Notes (Signed)
Pt comes to the ED via [facility] due to substance intoxication.  Pt was found sitting on the side of a street and was unable to stand or walk.  I went into pt's room and he was being escorted back to his room by bystander [facility].  Was told that pt walked into the pediatrics consultation room and urinated on the floor.  Pt is incoherent and can follow simple commands.  He seems to be tearful and asks the nurse to stay in the room.  Unable to ask pt any questions regarding medical history at this time.

## 2011-06-30 NOTE — Discharge Instructions (Signed)
Alcohol and Nutrition °Nutrition serves two purposes. It provides energy. It also maintains body structure and function. Food supplies energy. It also provides the building blocks needed to replace worn or damaged cells. Alcoholics often eat poorly. This limits their supply of essential nutrients. This affects energy supply and structure maintenance. Alcohol also affects the body's nutrients in: °· Digestion.  °· Storage.  °· Using and getting rid of waste products.  °IMPAIRMENT OF NUTRIENT DIGESTION AND UTILIZATION  °· Once ingested, food must be broken down into small components (digested). Then it is available for energy. It helps maintain body structure and function. Digestion begins in the mouth. It continues in the stomach and intestines, with help from the pancreas. The nutrients from digested food are absorbed from the intestines into the blood. Then they are carried to the liver. The liver prepares nutrients for:  °· Immediate use.  °· Storage and future use.  °· Alcohol inhibits the breakdown of nutrients into usable molecules.  °· It decreases secretion of digestive enzymes from the pancreas.  °· Alcohol impairs nutrient absorption by damaging the cells lining the stomach and intestines.  °· It also interferes with moving some nutrients into the blood.  °· In addition, nutritional deficiencies themselves may lead to further absorption problems.  °· For example, folate deficiency changes the cells that line the small intestine. This impairs how water is absorbed. It also affects absorbed nutrients. These include glucose, sodium, and additional folate.  °· Even if nutrients are digested and absorbed, alcohol can prevent them from being fully used. It changes their transport, storage, and excretion. Impaired utilization of nutrients by alcoholics is indicated by:  °· Decreased liver stores of vitamins, such as vitamin A.  °· Increased excretion of nutrients such as fat.  °ALCOHOL AND ENERGY SUPPLY  °· Three  basic nutritional components found in food are:  °· Carbohydrates.  °· Proteins.  °· Fats.  °· These are used as energy. Some alcoholics take in as much as 50% of their total daily calories from alcohol. They often neglect important foods.  °· Even when enough food is eaten, alcohol can impair the ways the body controls blood sugar (glucose) levels. It may either increase or decrease blood sugar.  °· In non-diabetic alcoholics, increased blood sugar (hyperglycemia) is caused by poor insulin secretion. It is usually temporary.  °· Decreased blood sugar (hypoglycemia) can cause serious injury even if this condition is short-lived. Low blood sugar can happen when a fasting or malnourished person drinks alcohol. When there is no food to supply energy, stored sugar is used up. The products of alcohol inhibit forming glucose from other compounds such as amino acids. As a result, alcohol causes the brain and other body tissue to lack glucose. It is needed for energy and function.  °· Alcohol is an energy source. But how the body processes and uses the energy from alcohol is complex. Also, when alcohol is substituted for carbohydrates, subjects tend to lose weight. This indicates that they get less energy from alcohol than from food.  °ALCOHOL - MAINTAINING CELL STRUCTURE AND FUNCTION  °Structure °Cells are made mostly of protein. So an adequate protein diet is important for maintaining cell structure. This is especially true if cells are being damaged. Research indicates that alcohol affects protein nutrition by causing impaired: °· Digestion of proteins to amino acids.  °· Processing of amino acids by the small intestine and liver.  °· Synthesis of proteins from amino acids.  °· Protein   secretion by the liver.  °Function °Nutrients are essential for the body to function well. They provide the tools that the body needs to work well:  °· Proteins.  °· Vitamins.  °· Minerals.  °Alcohol can disrupt body function. It may cause  nutrient deficiencies. And it may interfere with the way nutrients are processed. °Vitamins °· Vitamins are essential to maintain growth and normal metabolism. They regulate many of the body`s processes. Chronic heavy drinking causes deficiencies in many vitamins. This is caused by eating less. And, in some cases, vitamins may be poorly absorbed. For example, alcohol inhibits fat absorption. It impairs how the vitamins A, E, and D are normally absorbed along with dietary fats. Not enough vitamin A may cause night blindness. Not enough vitamin D may cause softening of the bones.  °· Some alcoholics lack vitamins A, C, D, E, K, and the B vitamins. These are all involved in wound healing and cell maintenance. In particular, because vitamin K is necessary for blood clotting, lacking that vitamin can cause delayed clotting. The result is excess bleeding. Lacking other vitamins involved in brain function may cause severe neurological damage.  °Minerals °Deficiencies of minerals such as calcium, magnesium, iron, and zinc are common in alcoholics. The alcohol itself does not seem to affect how these minerals are absorbed. Rather, they seem to occur secondary to other alcohol-related problems, such as: °· Less calcium absorbed.  °· Not enough magnesium.  °· More urinary excretion.  °· Vomiting.  °· Diarrhea.  °· Not enough iron due to gastrointestinal bleeding.  °· Not enough zinc or losses related to other nutrient deficiencies.  °· Mineral deficiencies can cause a variety of medical consequences. These range from calcium-related bone disease to zinc-related night blindness and skin lesions.  °ALCOHOL, MALNUTRITION, AND MEDICAL COMPLICATIONS  °Liver Disease  °· Alcoholic liver damage is caused primarily by alcohol itself. But poor nutrition may increase the risk of alcohol-related liver damage. For example, nutrients normally found in the liver are known to be affected by drinking alcohol. These include carotenoids, which  are the major sources of vitamin A, and vitamin E compounds. Decreases in such nutrients may play some role in alcohol-related liver damage.  °Pancreatitis °· Research suggests that malnutrition may increase the risk of developing alcoholic pancreatitis. Research suggests that a diet lacking in protein may increase alcohol's damaging effect on the pancreas.  °Brain °· Nutritional deficiencies may have severe effects on brain function. These may be permanent. Specifically, thiamine deficiencies are often seen in alcoholics. They can cause severe neurological problems. These include:  °· Impaired movement.  °· Memory loss seen in Wernicke-Korsakoff syndrome.  °Pregnancy °· Alcohol has toxic effects on fetal development. It causes alcohol-related birth defects. They include fetal alcohol syndrome. Alcohol itself is toxic to the fetus. Also, the nutritional deficiency can affect how the fetus develops. That may compound the risk of developmental damage.  °· Nutritional needs during pregnancy are 10% to 30% greater than normal. Food intake can increase by as much as 140% to cover the needs of both mother and fetus. An alcoholic mother`s nutritional problems may adversely affect the nutrition of the fetus. And alcohol itself can also restrict nutrition flow to the fetus.  °NUTRITIONAL STATUS OF ALCOHOLICS  °Techniques for assessing nutritional status include: °· Taking body measurements to estimate fat reserves. They include:  °· Weight.  °· Height.  °· Mass.  °· Skin fold thickness.  °· Performing blood analysis to provide measurements of circulating:  °·   Proteins.   Vitamins.   Minerals.   These techniques tend to be imprecise. For many nutrients, there is no clear "cut-off" point that would allow an accurate definition of deficiency. So assessing the nutritional status of alcoholics is limited by these techniques. Dietary status may provide information about the risk of developing nutritional problems. Dietary  status is assessed by:   Taking patients' dietary histories.   Evaluating the amount and types of food they are eating.   It is difficult to determine what exact amount of alcohol begins to have damaging effects on nutrition. In general, moderate drinkers have 2 drinks or less per day. They seem to be at little risk for nutritional problems. Various medical disorders begin to appear at greater levels.   Research indicates that the majority of even the heaviest drinkers have few obvious nutritional deficiencies. Many alcoholics who are hospitalized for medical complications of their disease do have severe malnutrition. Alcoholics tend to eat poorly. Often they eat less than the amounts of food necessary to provide enough:   Carbohydrates.   Protein.   Fat.   Vitamins A and C.   B vitamins.   Minerals like calcium and iron.  Of major concern is alcohol's effect on digesting food and use of nutrients. It may shift a mildly malnourished person toward severe malnutrition. Document Released: 12/31/2004 Document Revised: 02/25/2011 Document Reviewed: 06/16/2005 [facility] Patient Information 2012 [place], [place].Alcohol Problems Most adults who drink alcohol drink in moderation (not a lot) are at low risk for developing problems related to their drinking. However, all drinkers, including low-risk drinkers, should know about the health risks connected with drinking alcohol. RECOMMENDATIONS FOR LOW-RISK DRINKING  Drink in moderation. Moderate drinking is defined as follows:   Men - no more than 2 drinks per day.   Nonpregnant women - no more than 1 drink per day.   Over age 61 - no more than 1 drink per day.  A standard drink is 12 grams of pure alcohol, which is equal to a 12 ounce bottle of beer or wine cooler, a 5 ounce glass of wine, or 1.5 ounces of distilled spirits (such as whiskey, brandy, vodka, or rum).  ABSTAIN FROM (DO NOT DRINK) ALCOHOL:  When pregnant or considering pregnancy.     When taking a medication that interacts with alcohol.   If you are alcohol dependent.   A medical condition that prohibits drinking alcohol (such as ulcer, liver disease, or heart disease).  DISCUSS WITH YOUR CAREGIVER:  If you are at risk for coronary heart disease, discuss the potential benefits and risks of alcohol use: Light to moderate drinking is associated with lower rates of coronary heart disease in certain populations (for example, men over age 54 and postmenopausal women). Infrequent or nondrinkers are advised not to begin light to moderate drinking to reduce the risk of coronary heart disease so as to avoid creating an alcohol-related problem. Similar protective effects can likely be gained through proper diet and exercise.   Women and the elderly have smaller amounts of body water than men. As a result women and the elderly achieve a higher blood alcohol concentration after drinking the same amount of alcohol.   Exposing a fetus to alcohol can cause a broad range of birth defects referred to as Fetal Alcohol Syndrome (FAS) or Alcohol-Related Birth Defects (ARBD). Although FAS/ARBD is connected with excessive alcohol consumption during pregnancy, studies also have reported neurobehavioral problems in infants born to mothers reporting drinking an average of  1 drink per day during pregnancy.   Heavier drinking (the consumption of more than 4 drinks per occasion by men and more than 3 drinks per occasion by women) impairs learning (cognitive) and psychomotor functions and increases the risk of alcohol-related problems, including accidents and injuries.  CAGE QUESTIONS:   Have you ever felt that you should Cut down on your drinking?   Have people Annoyed you by criticizing your drinking?   Have you ever felt bad or Guilty about your drinking?   Have you ever had a drink first thing in the morning to steady your nerves or get rid of a hangover (Eye opener)?  If you answered  positively to any of these questions: You may be at risk for alcohol-related problems if alcohol consumption is:   Men: Greater than 14 drinks per week or more than 4 drinks per occasion.   Women: Greater than 7 drinks per week or more than 3 drinks per occasion.  Do you or your family have a medical history of alcohol-related problems, such as:  Blackouts.   Sexual dysfunction.   Depression.   Trauma.   Liver dysfunction.   Sleep disorders.   Hypertension.   Chronic abdominal pain.   Has your drinking ever caused you problems, such as problems with your family, problems with your work (or school) performance, or accidents/injuries?   Do you have a compulsion to drink or a preoccupation with drinking?   Do you have poor control or are you unable to stop drinking once you have started?   Do you have to drink to avoid withdrawal symptoms?   Do you have problems with withdrawal such as tremors, nausea, sweats, or mood disturbances?   Does it take more alcohol than in the past to get you high?   Do you feel a strong urge to drink?   Do you change your plans so that you can have a drink?   Do you ever drink in the morning to relieve the shakes or a hangover?  If you have answered a number of the previous questions positively, it may be time for you to talk to your caregivers, family, and friends and see if they think you have a problem. Alcoholism is a chemical dependency that keeps getting worse and will eventually destroy your health and relationships. Many alcoholics end up dead, impoverished, or in prison. This is often the end result of all chemical dependency.  Do not be discouraged if you are not ready to take action immediately.   Decisions to change behavior often involve up and down desires to change and feeling like you cannot decide.   Try to think more seriously about your drinking behavior.   Think of the reasons to quit.  WHERE TO GO FOR ADDITIONAL  INFORMATION   The National Institute on Alcohol Abuse and Alcoholism (NIAAA)www.niaaa.nih.gov   [employer] on Alcoholism and Drug Dependence (NCADD)www.ncadd.org   American Society of Addiction Medicine (ASAM)www.https://anderson-johnson.com/  Document Released: 03/08/2005 Document Revised: 02/25/2011 Document Reviewed: 10/25/2007 [facility] Patient Information 2012 [place], [place].  RESOURCE GUIDE  Dental Problems  Patients with Medicaid: [facility] [phone]  [address] Phone:  [phone]                                                  Phone:  [phone]  If unable to pay or uninsured, contact:  [facility] or [facility]. to become qualified for the adult dental clinic.  Chronic Pain Problems Contact [facility]  [phone] Patients need to be referred by their primary care doctor.  Insufficient Money for Medicine Contact [employer]:  call "211" or [facility] 270-478-8091.  No Primary Care Doctor Call [facility]  [phone] Other agencies that provide inexpensive medical care    [facility]  [phone]    [facility]  [phone]    [facility]  [phone]    [facility]  [phone]    [facility]  [phone]    [facility]  [phone]  Psychological Services [facility]  [phone] [facility]  [phone] [facility]   [phone] (emergency services [phone])  Substance Abuse Resources [facility]  5034825028 [facility] [phone] [facility] [phone] Floydene Flock [phone] Residential & Outpatient Substance Abuse Program  [phone]  Abuse/Neglect [facility] Child Abuse Hotline 503-881-6619 [facility] Child Abuse Hotline [phone] (After Hours)  Emergency Shelter [facility] [phone]  Maternity Homes [facility] at [facility] [place] [facility] 2153647807 [facility] Alert Services [phone]  MRSA Hotline #:   801 303 6412    [facility] Resources  [facility]     [employer]                          [facility] Dept. [address] [place]                       [address]      [address]  Blondell Reveal Phone:  [phone]                                   Phone:  [phone]                 Phone:  [phone]  [facility] Phone:  [phone]  [facility] Child Abuse Hotline [phone] (After Hours)

## 2011-06-30 NOTE — ED Provider Notes (Signed)
History     CSN: [id number]  Arrival date & time 06/30/11  1300   First MD Initiated Contact with Patient 06/30/11 1352      Chief Complaint  Patient presents with  . Alcohol Intoxication    (Consider location/radiation/quality/duration/timing/severity/associated sxs/prior treatment) HPI  51 year-old male with a significant history of alcohol abuse and history of bipolar disorder presents to the ED by EMS when he was found to be intoxicated out on the side of the road. Per EMS note, patient is unable to stand or walk and when they try to escort him home his father denies entry. Pt then were brought to ED.  History was difficult to obtain.  Admits to drinking a heavy amount alcohol today. He is unable to quantify. He is unable to say why he is drinking. Patient denies hurting himself or other. Does complain of "feeling drunk" with nausea, and vomiting but he denies chest pain, shortness of breath, abdominal pain, back pain.  Patient has multiple prior recent ER visits for alcohol intoxication.    Past Medical History  Diagnosis Date  . Back pain   . Neck pain   . Bipolar 1 disorder   . ETOH abuse     No past surgical history on file.  No family history on file.  History  Substance Use Topics  . Smoking status: Never Smoker   . Smokeless tobacco: Not on file  . Alcohol Use: Yes     ETOH abuse      Review of Systems  Unable to perform ROS: Other  intoxication  Allergies  Hydrocodone  Home Medications   Current Outpatient Rx  Name Route Sig Dispense Refill  . ALPRAZOLAM 0.25 MG PO TABS Oral Take 0.25 mg by mouth at bedtime as needed.    . CHLORDIAZEPOXIDE HCL 25 MG PO CAPS Oral Take 1 capsule (25 mg total) by mouth 3 (three) times daily as needed for anxiety. 20 capsule 0  . CITALOPRAM HYDROBROMIDE 10 MG PO TABS Oral Take 10 mg by mouth daily.    . QUETIAPINE FUMARATE 100 MG PO TABS Oral Take 100 mg by mouth at bedtime.      BP 119/70  Pulse 121  Temp(Src) 98.3  F (36.8 C) (Oral)  Resp 22  SpO2 88%  Physical Exam  Nursing note and vitals reviewed. Constitutional: He appears well-developed and well-nourished. No distress.  HENT:  Head: Normocephalic and atraumatic.  Mouth/Throat: Oropharynx is clear and moist.  Eyes: Conjunctivae are normal.  Neck: Neck supple.  Cardiovascular: Intact distal pulses.  Tachycardia present.   Pulmonary/Chest: Breath sounds normal. No respiratory distress.  Abdominal: There is no tenderness. There is no rebound.  Neurological: He has normal strength. He is disoriented. No sensory deficit. GCS eye subscore is 4. GCS verbal subscore is 5. GCS motor subscore is 6.       Is able to follow simple command. No focal neuro deficits    ED Course  Procedures (including critical care time)  Labs Reviewed - No data to display No results found.   No diagnosis found.  Results for orders placed during the hospital encounter of 06/30/11  CBC      Component Value Range   WBC 5.1  4.0 - 10.5 (K/uL)   RBC 3.85 (*) 4.22 - 5.81 (MIL/uL)   Hemoglobin 11.9 (*) 13.0 - 17.0 (g/dL)   HCT 40.9 (*) 81.1 - 52.0 (%)   MCV 89.1  78.0 - 100.0 (fL)   MCH 30.9  26.0 - 34.0 (pg)   MCHC 34.7  30.0 - 36.0 (g/dL)   RDW 69.6  29.5 - 28.4 (%)   Platelets 193  150 - 400 (K/uL)  DIFFERENTIAL      Component Value Range   Neutrophils Relative 47  43 - 77 (%)   Neutro Abs 2.4  1.7 - 7.7 (K/uL)   Lymphocytes Relative 43  12 - 46 (%)   Lymphs Abs 2.2  0.7 - 4.0 (K/uL)   Monocytes Relative 9  3 - 12 (%)   Monocytes Absolute 0.4  0.1 - 1.0 (K/uL)   Eosinophils Relative 2  0 - 5 (%)   Eosinophils Absolute 0.1  0.0 - 0.7 (K/uL)   Basophils Relative 0  0 - 1 (%)   Basophils Absolute 0.0  0.0 - 0.1 (K/uL)  BASIC METABOLIC PANEL      Component Value Range   Sodium 150 (*) 135 - 145 (mEq/L)   Potassium 3.4 (*) 3.5 - 5.1 (mEq/L)   Chloride 113 (*) 96 - 112 (mEq/L)   CO2 26  19 - 32 (mEq/L)   Glucose, Bld 110 (*) 70 - 99 (mg/dL)   BUN 7  6 -  23 (mg/dL)   Creatinine, Ser 1.32  0.50 - 1.35 (mg/dL)   Calcium 8.3 (*) 8.4 - 10.5 (mg/dL)   GFR calc non Af Amer >90  >90 (mL/min)   GFR calc Af Amer >90  >90 (mL/min)  ETHANOL      Component Value Range   Alcohol, Ethyl (B) 283 (*) 0 - 11 (mg/dL)   Ct Head Wo Contrast  06/27/2011  *RADIOLOGY REPORT*  Clinical Data: Head trauma.  CT HEAD WITHOUT CONTRAST  Technique:  Contiguous axial images were obtained from the base of the skull through the vertex without contrast.  Comparison: 05/03/2011  Findings: There is no acute intracranial hemorrhage, infarction, or mass.  Brain parenchyma is normal.  Osseous structures are normal.  IMPRESSION: Normal exam.  Original Report Authenticated By: Gwynn Burly, M.D.   Ct Cervical Spine Wo Contrast  06/27/2011  *RADIOLOGY REPORT*  Clinical Data: Head trauma.  CT CERVICAL SPINE WITHOUT CONTRAST  Technique:  Multidetector CT imaging of the cervical spine was performed. Multiplanar CT image reconstructions were also generated.  Comparison: 04/10/2011  Findings: There is no fracture, acute subluxation, prevertebral soft tissue swelling, or other significant acute abnormality. There is fairly severe degenerative disc disease at C4-5. Multilevel facet arthritis.  IMPRESSION: No acute abnormality of the cervical spine.  The study is somewhat degraded by motion artifact because the patient was agitated.  Original Report Authenticated By: Gwynn Burly, M.D.      MDM  Strong smell of EtOH on breath, appears to be intoxicated but not in any acute distress.  No signs of trauma.  ABC intact.  Plan to hydrates pt.     2:47 PM Stable VS.  Banana bag given.  Zofran for nausea and Protonix for epigastric discomfort.  Will continue to monitor.  CBC, BMP, Ethanol, and UDS ordered.   3:46 PM Labs show evidence of dehydration.  Will continue IV hydration.  Pt has alcohol level of 283.  I have discussed with Dr. Anitra Lauth, who will continue to monitor pt and ensure  sobriety prior to discharge.    Fayrene Helper, PA-C 06/30/11 1547

## 2011-06-30 NOTE — ED Notes (Signed)
Pt asked to use the phone and was given portable phone.  Pt denies SI, HI.  Pt states he was not trying to hurt himself he just drank to much.

## 2011-06-30 NOTE — ED Notes (Signed)
Pt d/c home, given bus pass and ginger ale.  Denies HI/SI.  Able to eat/drink.

## 2011-06-30 NOTE — ED Notes (Signed)
Per pts wife__ pt is manic depressive, bipolar, has pancreatitis and is suicidal.  Pt's wife spoke to Diplomatic Services operational officer and wanted this information added to his chart.  Act team is notified

## 2011-06-30 NOTE — ED Provider Notes (Signed)
Spoke with patient he is now sober. He denies any homicidal or suicidal ideation. He was able to eat without vomiting and he can walk without difficulty. The patient was discharged home.  Gwyneth Sprout, MD 06/30/11 2249

## 2011-06-30 NOTE — ED Notes (Addendum)
Pt asking for food.  Given crackers, cheese and [place] sandwich.  Pt unable to speak clearly due to intoxication.

## 2011-06-30 NOTE — ED Notes (Signed)
Pt got off stretcher and put his clothes on over paper scrubs.  Pt was attempting to pull IV from forearm.  Pt stated that he was leaving.  RN reassured the pt that he was here for his best interest and pt agreed to get back on the stretcher and remove his shoes, jeans and shirt.  Pt agreed to stop pulling on   Pt asked to use his cell phone and was given cellphone by RN.  Pt fell asleep on stretcher

## 2011-06-30 NOTE — ED Notes (Signed)
EDP at bedside to speak with pt. 

## 2011-06-30 NOTE — ED Notes (Signed)
Sitter at bedside.

## 2011-06-30 NOTE — ED Notes (Signed)
Per PTAR:  Pt was sitting on side of road - pt was unable to stand or walk. PTAR attempted to walk him back to his residence.  Pt's family member (father) denied him entry - pt was then taking to the ED. VS: 130/70 HR: 120 R: 20 O2: 100@ RA CBG: 159

## 2011-07-01 NOTE — ED Provider Notes (Signed)
Medical screening examination/treatment/procedure(s) were conducted as a shared visit with non-physician practitioner(s) and myself.  I personally evaluated the patient during the encounter   Gwyneth Sprout, MD 07/01/11 2356

## 2011-12-11 ENCOUNTER — Emergency Department (HOSPITAL_COMMUNITY)
Admission: EM | Admit: 2011-12-11 | Discharge: 2011-12-11 | Disposition: A | Discharge status: Home / Self Care | Payer: Self-pay | Attending: Emergency Medicine | Admitting: Emergency Medicine

## 2011-12-11 ENCOUNTER — Encounter (HOSPITAL_COMMUNITY): Payer: Self-pay | Admitting: Emergency Medicine

## 2011-12-11 ENCOUNTER — Emergency Department (HOSPITAL_COMMUNITY)
Admission: EM | Admit: 2011-12-11 | Discharge: 2011-12-12 | Disposition: A | Discharge status: Home / Self Care | Payer: Self-pay | Attending: Emergency Medicine | Admitting: Emergency Medicine

## 2011-12-11 ENCOUNTER — Encounter (HOSPITAL_COMMUNITY): Payer: Self-pay | Admitting: *Deleted

## 2011-12-11 DIAGNOSIS — F1010 Alcohol abuse, uncomplicated: Secondary | ICD-10-CM | POA: Insufficient documentation

## 2011-12-11 DIAGNOSIS — F319 Bipolar disorder, unspecified: Secondary | ICD-10-CM | POA: Insufficient documentation

## 2011-12-11 DIAGNOSIS — F10129 Alcohol abuse with intoxication, unspecified: Secondary | ICD-10-CM

## 2011-12-11 DIAGNOSIS — F10929 Alcohol use, unspecified with intoxication, unspecified: Secondary | ICD-10-CM

## 2011-12-11 LAB — COMPREHENSIVE METABOLIC PANEL
Albumin: 3.4 g/dL — ABNORMAL LOW (ref 3.5–5.2)
Alkaline Phosphatase: 74 U/L (ref 39–117)
BUN: 6 mg/dL (ref 6–23)
Creatinine, Ser: 0.63 mg/dL (ref 0.50–1.35)
GFR calc Af Amer: >90 mL/min (ref >90)
Glucose, Bld: 108 mg/dL — ABNORMAL HIGH (ref 70–99)
Total Bilirubin: 0.6 mg/dL (ref 0.3–1.2)
Total Protein: 6.5 g/dL (ref 6.0–8.3)

## 2011-12-11 LAB — CBC WITH DIFFERENTIAL/PLATELET
Basophils Absolute: 0 10*3/uL (ref 0.0–0.1)
Basophils Relative: 0 % (ref 0–1)
Eosinophils Relative: 1 % (ref 0–5)
Lymphocytes Relative: 38 % (ref 12–46)
MCV: 85.9 fL (ref 78.0–100.0)
Platelets: 154 10*3/uL (ref 150–400)
RDW: 14.7 % (ref 11.5–15.5)
WBC: 5.6 10*3/uL (ref 4.0–10.5)

## 2011-12-11 LAB — RAPID URINE DRUG SCREEN, HOSP PERFORMED
Amphetamines: NOT DETECTED
Cocaine: NOT DETECTED
Opiates: NOT DETECTED
Tetrahydrocannabinol: NOT DETECTED

## 2011-12-11 MED ORDER — THIAMINE HCL 100 MG/ML IJ SOLN: 100.0000 mg | Freq: Every day | INTRAMUSCULAR | Status: DC

## 2011-12-11 MED ORDER — VITAMIN B-1 100 MG PO TABS
100.0000 mg | ORAL_TABLET | Freq: Every day | ORAL | Status: DC
  Administered 2011-12-11: 100 mg via ORAL
  Filled 2011-12-11: qty 1

## 2011-12-11 MED ORDER — SODIUM CHLORIDE 0.9 % IV SOLN
1000.0000 mL | Freq: Once | INTRAVENOUS | Status: AC
  Administered 2011-12-11: 1000 mL via INTRAVENOUS

## 2011-12-11 MED ORDER — LORAZEPAM 1 MG PO TABS: 0.0000 mg | ORAL_TABLET | Freq: Two times a day (BID) | ORAL | Status: DC

## 2011-12-11 MED ORDER — LORAZEPAM 1 MG PO TABS
0.0000 mg | ORAL_TABLET | Freq: Four times a day (QID) | ORAL | Status: DC
  Administered 2011-12-11: 1 mg via ORAL

## 2011-12-11 MED ORDER — LORAZEPAM 1 MG PO TABS
1.0000 mg | ORAL_TABLET | Freq: Four times a day (QID) | ORAL | Status: DC | PRN
  Filled 2011-12-11: qty 1

## 2011-12-11 MED ORDER — LORAZEPAM 2 MG/ML IJ SOLN: 1.0000 mg | Freq: Four times a day (QID) | INTRAMUSCULAR | Status: DC | PRN

## 2011-12-11 MED ORDER — SODIUM CHLORIDE 0.9 % IV SOLN
1000.0000 mL | INTRAVENOUS | Status: DC
  Administered 2011-12-11: 1000 mL via INTRAVENOUS

## 2011-12-11 MED ORDER — ADULT MULTIVITAMIN W/MINERALS CH
1.0000 | ORAL_TABLET | Freq: Every day | ORAL | Status: DC
  Administered 2011-12-11: 1 via ORAL
  Filled 2011-12-11: qty 1

## 2011-12-11 MED ORDER — FOLIC ACID 1 MG PO TABS
1.0000 mg | ORAL_TABLET | Freq: Every day | ORAL | Status: DC
  Administered 2011-12-11: 1 mg via ORAL
  Filled 2011-12-11: qty 1

## 2011-12-11 NOTE — ED Notes (Signed)
Gave pt a phone to call his girl friend

## 2011-12-11 NOTE — ED Notes (Signed)
Per EMS pt was found downtown drunk. Pt was too drunk and not ambulatory to go to "drunk tank" so he was brought here.

## 2011-12-11 NOTE — ED Provider Notes (Signed)
History     CSN: [id number] Arrival date & time 12/11/11  1421 First MD Initiated Contact with Patient 12/11/11 1445      Chief Complaint  Patient presents with  . Alcohol Intoxication    HPI Pt was found drunk by police.  They felt he was too drunk for the drunk tank so he was brought to the ED.  Pt has Youtz history of alcohol use.  He has been in detox programs maybe "50 times" over his lifetime.  Pt denies SI or HI.  He does feel depressed about his drinking.  No abdominal pain. No injuries.    [place] Past Medical History  Diagnosis Date  . Back pain   . Neck pain   . Bipolar 1 disorder   . ETOH abuse     History reviewed. No pertinent past surgical history.  History reviewed. No pertinent family history.  History  Substance Use Topics  . Smoking status: Never Smoker   . Smokeless tobacco: Not on file  . Alcohol Use: Yes     ETOH abuse      Review of Systems  All other systems reviewed and are negative.    Allergies  Hydrocodone  Home Medications   Current Outpatient Rx  Name Route Sig Dispense Refill  . ALPRAZOLAM 0.25 MG PO TABS Oral Take 0.25 mg by mouth at bedtime as needed.    Marland Kitchen CITALOPRAM HYDROBROMIDE 10 MG PO TABS Oral Take 10 mg by mouth daily.    . QUETIAPINE FUMARATE 100 MG PO TABS Oral Take 100 mg by mouth at bedtime.      BP 143/94  Pulse 100  Temp 98.7 F (37.1 C) (Oral)  Resp 16  SpO2 97%  Physical Exam  Nursing note and vitals reviewed. Constitutional: No distress.  HENT:  Head: Normocephalic and atraumatic.  Right Ear: External ear normal.  Left Ear: External ear normal.  Eyes: Conjunctivae normal are normal. Right eye exhibits no discharge. Left eye exhibits no discharge. No scleral icterus.  Neck: Neck supple. No tracheal deviation present.       Breath smells of alcohol  Cardiovascular: Normal rate, regular rhythm and intact distal pulses.   Pulmonary/Chest: Effort normal and breath sounds normal. No  stridor. No respiratory distress. He has no wheezes. He has no rales.  Abdominal: Soft. Bowel sounds are normal. He exhibits no distension. There is no tenderness. There is no rebound and no guarding.  Musculoskeletal: He exhibits no edema and no tenderness.  Neurological: He is alert. He has normal strength. No sensory deficit. Cranial nerve deficit:  no gross defecits noted. He exhibits normal muscle tone. He displays no seizure activity. Coordination normal.  Skin: Skin is warm and dry. No rash noted.  Psychiatric: He has a normal mood and affect.    ED Course  Procedures (including critical care time)  Medications  0.9 %  sodium chloride infusion (1000 mL Intravenous New Bag/Given 12/11/11 1546)    Followed by  0.9 %  sodium chloride infusion (not administered)    Followed by  0.9 %  sodium chloride infusion (not administered)  LORazepam (ATIVAN) tablet 1 mg (not administered)    Or  LORazepam (ATIVAN) injection 1 mg (not administered)  thiamine (VITAMIN B-1) tablet 100 mg (not administered)    Or  thiamine (B-1) injection 100 mg (not administered)  folic acid (FOLVITE) tablet 1 mg (not administered)  multivitamin with minerals tablet 1 tablet (not administered)  LORazepam (ATIVAN)  tablet 0-4 mg (not administered)    Followed by  LORazepam (ATIVAN) tablet 0-4 mg (not administered)    Labs Reviewed  COMPREHENSIVE METABOLIC PANEL - Abnormal; Notable for the following:    Potassium 3.4 (*)     Glucose, Bld 108 (*)     Calcium 8.0 (*)     Albumin 3.4 (*)     AST 44 (*)     All other components within normal limits  ETHANOL - Abnormal; Notable for the following:    Alcohol, Ethyl (B) 365 (*)     All other components within normal limits  CBC WITH DIFFERENTIAL - Abnormal; Notable for the following:    RBC 3.77 (*)     Hemoglobin 11.6 (*)     HCT 32.4 (*)     All other components within normal limits  URINE RAPID DRUG SCREEN (HOSP PERFORMED)   No results found.   1.  Alcohol intoxication       MDM  Anemia is stable.  Pt significantly intoxicated.  Will allow him to sober up.  Will provide outpatient resources.        Celene Kras, MD 12/11/11 [phone]

## 2011-12-11 NOTE — ED Notes (Signed)
Pt awake and talking on cell phone.

## 2011-12-11 NOTE — ED Notes (Signed)
[id number]<YN> Expected date:<BR> Expected time:<BR> Means of arrival:<BR> Comments:<BR> Triage, PTAR 42, 51 M, Medical Clearance, ETOH

## 2011-12-11 NOTE — ED Notes (Signed)
[id number]<JY> Expected date:12/11/11<BR> Expected time:<BR> Means of arrival:<BR> Comments:<BR> [facility]--  M -- ETOH, from jail, nonambulatory

## 2011-12-11 NOTE — ED Notes (Signed)
Pt is homeless and wants detox for ETOH

## 2011-12-11 NOTE — ED Provider Notes (Signed)
Craig [name] is a 51 y.o. male who is being evaluated for alcohol intoxication. At this time. He is alert, lucid, walks with a normal gait. Repeat vital signs are normal. The patient states that he is an alcoholic and he will keep drinking until he dies.  He was offered referral to alcohol treatment centers and stated that he has already been given them. He expressed no further desires.   Assessment: Alcohol intoxication, improved, in stable for discharge on its own.  He is encouraged to stop drinking.  Flint Melter, MD 12/11/11 [phone]

## 2011-12-12 ENCOUNTER — Emergency Department (HOSPITAL_COMMUNITY)
Admission: EM | Admit: 2011-12-12 | Discharge: 2011-12-13 | Disposition: A | Discharge status: Home / Self Care | Payer: Self-pay | Attending: Emergency Medicine | Admitting: Emergency Medicine

## 2011-12-12 ENCOUNTER — Encounter (HOSPITAL_COMMUNITY): Payer: Self-pay | Admitting: *Deleted

## 2011-12-12 ENCOUNTER — Emergency Department (HOSPITAL_COMMUNITY): Payer: Self-pay

## 2011-12-12 DIAGNOSIS — F10929 Alcohol use, unspecified with intoxication, unspecified: Secondary | ICD-10-CM

## 2011-12-12 DIAGNOSIS — F319 Bipolar disorder, unspecified: Secondary | ICD-10-CM | POA: Insufficient documentation

## 2011-12-12 DIAGNOSIS — F1010 Alcohol abuse, uncomplicated: Secondary | ICD-10-CM | POA: Insufficient documentation

## 2011-12-12 LAB — CBC WITH DIFFERENTIAL/PLATELET
Basophils Absolute: 0 10*3/uL (ref 0.0–0.1)
Basophils Relative: 0 % (ref 0–1)
Eosinophils Absolute: 0.1 10*3/uL (ref 0.0–0.7)
Eosinophils Relative: 2 % (ref 0–5)
HCT: 33 % — ABNORMAL LOW (ref 39.0–52.0)
MCH: 30.1 pg (ref 26.0–34.0)
MCHC: 35.2 g/dL (ref 30.0–36.0)
MCV: 85.7 fL (ref 78.0–100.0)
Monocytes Absolute: 0.2 10*3/uL (ref 0.1–1.0)
Neutro Abs: 2.6 10*3/uL (ref 1.7–7.7)
RDW: 14.9 % (ref 11.5–15.5)

## 2011-12-12 LAB — COMPREHENSIVE METABOLIC PANEL
AST: 36 U/L (ref 0–37)
Albumin: 3 g/dL — ABNORMAL LOW (ref 3.5–5.2)
BUN: 8 mg/dL (ref 6–23)
Calcium: 7.5 mg/dL — ABNORMAL LOW (ref 8.4–10.5)
Creatinine, Ser: 0.62 mg/dL (ref 0.50–1.35)
Total Protein: 5.9 g/dL — ABNORMAL LOW (ref 6.0–8.3)

## 2011-12-12 LAB — ETHANOL: Alcohol, Ethyl (B): 461 mg/dL (ref 0–11)

## 2011-12-12 LAB — GLUCOSE, CAPILLARY: Glucose-Capillary: 102 mg/dL — ABNORMAL HIGH (ref 70–99)

## 2011-12-12 MED ORDER — POTASSIUM CHLORIDE CRYS ER 20 MEQ PO TBCR
40.0000 meq | EXTENDED_RELEASE_TABLET | Freq: Once | ORAL | Status: AC
  Administered 2011-12-12: 40 meq via ORAL
  Filled 2011-12-12: qty 2

## 2011-12-12 MED ORDER — SODIUM CHLORIDE 0.9 % IV SOLN
1000.0000 mL | INTRAVENOUS | Status: DC
  Administered 2011-12-12 (×2): 1000 mL via INTRAVENOUS

## 2011-12-12 MED ORDER — SODIUM CHLORIDE 0.9 % IV SOLN
1000.0000 mL | Freq: Once | INTRAVENOUS | Status: AC
  Administered 2011-12-12: 1000 mL via INTRAVENOUS

## 2011-12-12 NOTE — ED Provider Notes (Signed)
I received sign out from Dr. Lynelle Doctor at 4pm. I reassessed patient at 10pm. He is still sleepy and difficulty to arouse. I got labs that were unremarkable. CT head showed no bleed. Patient doesn't want detox. I signed out to Dr. Read Drivers that he should be discharged when he is sober.   Richardean Canal, MD 12/12/11 [phone]

## 2011-12-12 NOTE — ED Notes (Signed)
Per EMS. [place].  Pt seen recently for the same. Pt in no apparent distress.

## 2011-12-12 NOTE — ED Notes (Signed)
Pt no longer requesting detox, states he 'wants to sleep'.

## 2011-12-12 NOTE — ED Provider Notes (Signed)
Medical screening examination/treatment/procedure(s) were performed by non-physician practitioner and as supervising physician I was immediately available for consultation/collaboration.  Sunnie Nielsen, MD 12/12/11 [phone]

## 2011-12-12 NOTE — ED Notes (Signed)
Pt remains on a stretcher in triage in view of all staff. Pt sleeping heavily but easily rousable  Pt ambulatory to BR w/ standby assist at this time.

## 2011-12-12 NOTE — ED Notes (Signed)
[id number]<YQ> Expected date:<BR> Expected time:<BR> Means of arrival:<BR> Comments:<BR> Craig [name]

## 2011-12-12 NOTE — ED Provider Notes (Signed)
History     CSN: [id number]  Arrival date & time 12/12/11  1006   First MD Initiated Contact with Patient 12/12/11 1007      Chief Complaint  Patient presents with  . Alcohol Intoxication    HPI This is the third visit for the patient in the last 2 days to the ED for alcohol intoxication.  Patient is an alcoholic and homeless. He has been drinking heavily the last couple of days. He was seen in the emergency room initially yesterday after being found intoxicated on public. He was brought to the emergency room. The patient sobered up and eventually left the hospital on his own accord. He subsequently went out and started drinking again and was brought back to the hospital. Patient has not been interested in alcohol detox. Patient states he was drinking again and again bystanders found him drunk [place] the bus station [place] bench. Patient denies any trauma, abdominal pain fever or other complaints. Past Medical History  Diagnosis Date  . Back pain   . Neck pain   . Bipolar 1 disorder   . ETOH abuse     History reviewed. No pertinent past surgical history.  No family history on file.  History  Substance Use Topics  . Smoking status: Never Smoker   . Smokeless tobacco: Not on file  . Alcohol Use: Yes     ETOH abuse      Review of Systems  Constitutional: Negative for fever.  All other systems reviewed and are negative.    Allergies  Hydrocodone  Home Medications   Current Outpatient Rx  Name Route Sig Dispense Refill  . ALPRAZOLAM 0.25 MG PO TABS Oral Take 0.25 mg by mouth at bedtime as needed.    Marland Kitchen CITALOPRAM HYDROBROMIDE 10 MG PO TABS Oral Take 10 mg by mouth daily.    . QUETIAPINE FUMARATE 100 MG PO TABS Oral Take 100 mg by mouth at bedtime.      BP 123/79  Pulse 90  Temp 97.8 F (36.6 C) (Oral)  Resp 20  SpO2 95%  Physical Exam  Nursing note and vitals reviewed. Constitutional: He appears lethargic. No distress.  HENT:  Head: Normocephalic and  atraumatic.  Right Ear: External ear normal.  Left Ear: External ear normal.       etoh odor  Eyes: Conjunctivae normal are normal. Right eye exhibits no discharge. Left eye exhibits no discharge. No scleral icterus.  Neck: Neck supple. No tracheal deviation present.  Cardiovascular: Normal rate, regular rhythm and intact distal pulses.   Pulmonary/Chest: Effort normal and breath sounds normal. No stridor. No respiratory distress. He has no wheezes. He has no rales.  Abdominal: Soft. Bowel sounds are normal. He exhibits no distension. There is no tenderness. There is no rebound and no guarding.       Protuberant   Musculoskeletal: He exhibits no edema and no tenderness.  Neurological: He has normal strength. He appears lethargic. No cranial nerve deficit ( no gross defecits noted) or sensory deficit. He exhibits normal muscle tone. He displays no seizure activity. Gait abnormal.       intoxicated  Skin: Skin is warm and dry. No rash noted.  Psychiatric: He has a normal mood and affect.    ED Course  Procedures (including critical care time)  Labs Reviewed  ETHANOL - Abnormal; Notable for the following:    Alcohol, Ethyl (B) 461 (*)     All other components within normal limits  No results found.   1. Alcohol intoxication       MDM  Pt was seen by me yesterday.  He was given thiamine and vitamins.  Pt will be monitored in the ED to ensure that he sobers up.  He has not been interested in alcohol detox in the past.       Celene Kras, MD 12/12/11 [phone]

## 2011-12-12 NOTE — ED Provider Notes (Signed)
Medical screening examination/treatment/procedure(s) were performed by non-physician practitioner and as supervising physician I was immediately available for consultation/collaboration.  Ryker Sudbury, MD 12/12/11 0745 

## 2011-12-12 NOTE — ED Provider Notes (Signed)
History     CSN: [id number]  Arrival date & time 12/11/11  2317   First MD Initiated Contact with Patient 12/11/11 2334      Chief Complaint  Patient presents with  . Alcohol Intoxication    (Consider location/radiation/quality/duration/timing/severity/associated sxs/prior treatment) HPI  51 y.o. male brought in by [employer] for alcohol intoxication. Patient was seen here several hours ago for alcohol intoxication and discharged. Patient sleeping but rousable since he wants to sleep. Denies wanting to quit drinking alcohol, suicidal or homicidal ideation, hallucinations.  Past Medical History  Diagnosis Date  . Back pain   . Neck pain   . Bipolar 1 disorder   . ETOH abuse     History reviewed. No pertinent past surgical history.  History reviewed. No pertinent family history.  History  Substance Use Topics  . Smoking status: Never Smoker   . Smokeless tobacco: Not on file  . Alcohol Use: Yes     ETOH abuse      Review of Systems  Constitutional: Negative for fever.  Respiratory: Negative for shortness of breath.   Cardiovascular: Negative for chest pain.  Gastrointestinal: Negative for nausea, vomiting, abdominal pain and diarrhea.  All other systems reviewed and are negative.    Allergies  Hydrocodone  Home Medications   Current Outpatient Rx  Name Route Sig Dispense Refill  . ALPRAZOLAM 0.25 MG PO TABS Oral Take 0.25 mg by mouth at bedtime as needed.    Marland Kitchen CITALOPRAM HYDROBROMIDE 10 MG PO TABS Oral Take 10 mg by mouth daily.    . QUETIAPINE FUMARATE 100 MG PO TABS Oral Take 100 mg by mouth at bedtime.      There were no vitals taken for this visit.  Physical Exam  Nursing note and vitals reviewed. Constitutional: He is oriented to person, place, and time. He appears well-developed and well-nourished. No distress.  HENT:  Head: Normocephalic and atraumatic.  Eyes: Conjunctivae normal and EOM are normal. Pupils are equal, round, and reactive to light.    Neck: Normal range of motion.  Cardiovascular: Normal rate, regular rhythm, normal heart sounds and intact distal pulses.   Pulmonary/Chest: Effort normal and breath sounds normal. No stridor. No respiratory distress. He has no wheezes. He exhibits no tenderness.  Abdominal: Soft. Bowel sounds are normal.  Musculoskeletal: Normal range of motion.  Neurological: He is alert and oriented to person, place, and time.  Skin: Skin is warm and dry.  Psychiatric: He has a normal mood and affect.    ED Course  Procedures (including critical care time)  Labs Reviewed - No data to display No results found.   1. Alcohol intoxication in active alcoholic       MDM  51 y.o. male with alcohol intoxication. Seen for similar and discharged for similar several hours ago. Patient is stable and requests to be left alone to sleep. Pt Signed out to PA Greene at shift change. Plan is to house patient until he sobers up and has a steady gait.        Wynetta Emery, PA-C 12/12/11 0136

## 2011-12-12 NOTE — ED Notes (Signed)
Pt remains sleeping heavily in view of nurses desk. Plan to allow him to sleep for another hour, wake and discharge.

## 2011-12-12 NOTE — ED Provider Notes (Signed)
Pt no longer intoxicated at triage. Able to ambulate to bathroom on his own. No longer wants detox he just wants to sleep. Pt was seen earlier for alcohol detox and was DC'd. Right after leaving, he went ouit and drank alcohol then came right back to the ED intoxicated.   Filed Vitals:   12/12/11 0407  BP: 136/89  Pulse: 99  Temp: 98 F (36.7 C)  Resp: 20    Pt has been advised of the symptoms that warrant their return to the ED. Patient has voiced understanding and has agreed to follow-up with the PCP or specialist.    Dorthula Matas, PA 12/12/11 [phone]

## 2011-12-13 NOTE — ED Notes (Signed)
Pt able to ambulate w/o assistance or difficulty.

## 2011-12-13 NOTE — ED Provider Notes (Signed)
1:41 AM Patient is awake and alert and able to ambulate without difficulty.    Hanley Seamen, MD 12/13/11 [phone]

## 2012-06-08 ENCOUNTER — Emergency Department (HOSPITAL_COMMUNITY): Payer: Medicaid Other

## 2012-06-08 ENCOUNTER — Encounter (HOSPITAL_COMMUNITY): Payer: Self-pay | Admitting: *Deleted

## 2012-06-08 ENCOUNTER — Emergency Department (HOSPITAL_COMMUNITY)
Admission: EM | Admit: 2012-06-08 | Discharge: 2012-06-09 | Disposition: A | Discharge status: Other Acute Inpatient Hospital | Payer: Medicaid Other | Attending: Emergency Medicine | Admitting: Emergency Medicine

## 2012-06-08 DIAGNOSIS — Z79899 Other long term (current) drug therapy: Secondary | ICD-10-CM | POA: Insufficient documentation

## 2012-06-08 DIAGNOSIS — Z8739 Personal history of other diseases of the musculoskeletal system and connective tissue: Secondary | ICD-10-CM | POA: Insufficient documentation

## 2012-06-08 DIAGNOSIS — F1020 Alcohol dependence, uncomplicated: Secondary | ICD-10-CM | POA: Insufficient documentation

## 2012-06-08 DIAGNOSIS — F319 Bipolar disorder, unspecified: Secondary | ICD-10-CM | POA: Insufficient documentation

## 2012-06-08 DIAGNOSIS — Z87828 Personal history of other (healed) physical injury and trauma: Secondary | ICD-10-CM | POA: Insufficient documentation

## 2012-06-08 DIAGNOSIS — R61 Generalized hyperhidrosis: Secondary | ICD-10-CM | POA: Insufficient documentation

## 2012-06-08 DIAGNOSIS — R569 Unspecified convulsions: Secondary | ICD-10-CM | POA: Insufficient documentation

## 2012-06-08 DIAGNOSIS — R259 Unspecified abnormal involuntary movements: Secondary | ICD-10-CM | POA: Insufficient documentation

## 2012-06-08 DIAGNOSIS — R000 Tachycardia, unspecified: Secondary | ICD-10-CM | POA: Insufficient documentation

## 2012-06-08 DIAGNOSIS — F10929 Alcohol use, unspecified with intoxication, unspecified: Secondary | ICD-10-CM

## 2012-06-08 HISTORY — DX: Other psychoactive substance dependence with withdrawal, unspecified: F19.239

## 2012-06-08 HISTORY — DX: Unspecified convulsions: R56.9

## 2012-06-08 LAB — COMPREHENSIVE METABOLIC PANEL
ALT: 44 U/L (ref 0–53)
AST: 60 U/L — ABNORMAL HIGH (ref 0–37)
Albumin: 3.9 g/dL (ref 3.5–5.2)
Alkaline Phosphatase: 94 U/L (ref 39–117)
BUN: 11 mg/dL (ref 6–23)
CO2: 23 mEq/L (ref 19–32)
Calcium: 8.5 mg/dL (ref 8.4–10.5)
Chloride: 104 mEq/L (ref 96–112)
Creatinine, Ser: 0.67 mg/dL (ref 0.50–1.35)
GFR calc Af Amer: >90 mL/min (ref >90)
GFR calc non Af Amer: >90 mL/min (ref >90)
Glucose, Bld: 98 mg/dL (ref 70–99)
Potassium: 4.1 mEq/L (ref 3.5–5.1)
Sodium: 144 mEq/L (ref 135–145)
Total Bilirubin: 0.2 mg/dL — ABNORMAL LOW (ref 0.3–1.2)
Total Protein: 8.7 g/dL — ABNORMAL HIGH (ref 6.0–8.3)

## 2012-06-08 LAB — SALICYLATE LEVEL: Salicylate Lvl: <2 mg/dL — ABNORMAL LOW (ref 2.8–20.0)

## 2012-06-08 LAB — CBC
HCT: 43.3 % (ref 39.0–52.0)
Hemoglobin: 14.8 g/dL (ref 13.0–17.0)
MCH: 30 pg (ref 26.0–34.0)
MCHC: 34.2 g/dL (ref 30.0–36.0)
MCV: 87.7 fL (ref 78.0–100.0)
Platelets: 244 10*3/uL (ref 150–400)
RBC: 4.94 MIL/uL (ref 4.22–5.81)
RDW: 16.3 % — ABNORMAL HIGH (ref 11.5–15.5)
WBC: 7.1 10*3/uL (ref 4.0–10.5)

## 2012-06-08 LAB — ACETAMINOPHEN LEVEL: Acetaminophen (Tylenol), Serum: <15 ug/mL (ref 10–30)

## 2012-06-08 LAB — ETHANOL: Alcohol, Ethyl (B): 382 mg/dL — ABNORMAL HIGH (ref 0–11)

## 2012-06-08 MED ORDER — SODIUM CHLORIDE 0.9 % IV BOLUS (SEPSIS)
1000.0000 mL | Freq: Once | INTRAVENOUS | Status: AC
  Administered 2012-06-08: 1000 mL via INTRAVENOUS

## 2012-06-08 MED ORDER — FOLIC ACID 1 MG PO TABS
1.0000 mg | ORAL_TABLET | Freq: Every day | ORAL | Status: DC
  Administered 2012-06-08: 1 mg via ORAL
  Filled 2012-06-08: qty 1

## 2012-06-08 MED ORDER — LORAZEPAM 1 MG PO TABS
1.0000 mg | ORAL_TABLET | Freq: Four times a day (QID) | ORAL | Status: DC | PRN
  Administered 2012-06-08 – 2012-06-09 (×2): 1 mg via ORAL
  Filled 2012-06-08 (×3): qty 1

## 2012-06-08 MED ORDER — LORAZEPAM 2 MG/ML IJ SOLN: 1.0000 mg | Freq: Four times a day (QID) | INTRAMUSCULAR | Status: DC | PRN

## 2012-06-08 MED ORDER — VITAMIN B-1 100 MG PO TABS
100.0000 mg | ORAL_TABLET | Freq: Every day | ORAL | Status: DC
  Administered 2012-06-08: 100 mg via ORAL
  Filled 2012-06-08: qty 1

## 2012-06-08 MED ORDER — ADULT MULTIVITAMIN W/MINERALS CH
1.0000 | ORAL_TABLET | Freq: Every day | ORAL | Status: DC
  Administered 2012-06-08: 1 via ORAL
  Filled 2012-06-08: qty 1

## 2012-06-08 MED ORDER — THIAMINE HCL 100 MG/ML IJ SOLN: 100.0000 mg | Freq: Every day | INTRAMUSCULAR | Status: DC

## 2012-06-08 NOTE — ED Notes (Signed)
PA at bedside.

## 2012-06-08 NOTE — ED Notes (Signed)
SW at bedside speaking to pt

## 2012-06-08 NOTE — ED Notes (Signed)
Security in to wand pt. Pt's family reports that they will be taking personal belongings home.
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
## 2012-06-08 NOTE — ED Notes (Signed)
Friends are reporting that pt is homeless, has been for about 3 weeks and that he has seizures during alcohol withdrawal.

## 2012-06-08 NOTE — ED Notes (Signed)
Mardene Celeste, friend, leaving at this time with patient's belongings.

## 2012-06-08 NOTE — ED Notes (Signed)
High risk fall band and red socks on pt.

## 2012-06-08 NOTE — Progress Notes (Signed)
Pt referred to [facility] and accepted.   Pt will need 4 to 5 day supply of neurontin and seroquel. Day CSW to follow up with Rn case manager and EDP regarding pt medications.   Pt BAL will need to be 160 before able to be transferred to [facility].   Per discussion with patient and nurse, pt friend Victorino Dike may be able to provide transportation for patient. Pt friend told nurse that she would be available in the morning. Pt sleeping however stated he does know his friends number and does have a photo id. If patient does not have transportation, pt may be able to go to [facility] by train. This Clinical research associate will inform day csw to check on pt transportation in the morning.   This Clinical research associate will ask act to complete [facility] authorization with [employer] starting 06/09/2012. Authorization can not be obtained until after midnight.   Catha Gosselin, LCSWA  [phone] .06/08/2012 10:24pm

## 2012-06-08 NOTE — Progress Notes (Addendum)
Pt referred to [facility], pending review.  Pt referred to [facility], pending review.  [facility] is not accepting new patients at this time due to system being down.   Catha Gosselin, Theresia Majors  [phone] .06/08/2012 8:48pm   Per discussion with [facility], further information regarding patient past seizures. Pt alcohol level will also need to decrease.   Catha Gosselin, LCSWA  [phone] .06/08/2012 21:53pm

## 2012-06-08 NOTE — ED Notes (Signed)
Pt to radiology at this time.

## 2012-06-08 NOTE — ED Provider Notes (Signed)
History     CSN: [id number]  Arrival date & time 06/08/12  1633   First MD Initiated Contact with Patient 06/08/12 1801      Chief Complaint  Patient presents with  . Medical Clearance    (Consider location/radiation/quality/duration/timing/severity/associated sxs/prior treatment) HPI Comments: 52 y/o male with a history of alcohol abuse, withdrawal seizures and bipolar disorder presents to the ED with two close friends requesting detox from alcohol. Patient is currently intoxicated and qualifies as a level 5 caveat. According to his friend, she took him in a few weeks back since he was living on the street and he has been drinking since. Admits to drinking "as much as he can get" daily. Unsure the last time he was sober, possibly November. He has a history of withdrawal seizures, however friend is not sure what to look for to know if he has had any seizure activity. Denies SI/HI. Friend unsure if patient hit his head last night while he was crawling around on the floor banging into the couches. Patient states he does want help with detox at this time.  The history is provided by the patient and a friend. The history is limited by the condition of the patient (intoxication).    Past Medical History  Diagnosis Date  . Back pain   . Neck pain   . Bipolar 1 disorder   . ETOH abuse   . Withdrawal seizures     History reviewed. No pertinent past surgical history.  History reviewed. No pertinent family history.  History  Substance Use Topics  . Smoking status: Never Smoker   . Smokeless tobacco: Never Used  . Alcohol Use: Yes     Comment: ETOH abuse      Review of Systems  Unable to perform ROS: Other    Allergies  Hydrocodone  Home Medications   Current Outpatient Rx  Name  Route  Sig  Dispense  Refill  . gabapentin (NEURONTIN) 300 MG capsule   Oral   Take 300 mg by mouth 2 (two) times daily.         . hydrOXYzine (VISTARIL) 50 MG capsule   Oral   Take 50 mg  by mouth 2 (two) times daily as needed for itching.         Marland Kitchen QUEtiapine (SEROQUEL) 100 MG tablet   Oral   Take 100 mg by mouth at bedtime.           BP 122/94  Pulse 115  Temp(Src) 98.7 F (37.1 C) (Oral)  Resp 18  Wt 230 lb (104.327 kg)  BMI 30.35 kg/m2  SpO2 95%  Physical Exam  Nursing note and vitals reviewed. Constitutional: He is oriented to person, place, and time. He appears well-developed and well-nourished. No distress.  Smells of ETOH.  HENT:  Head: Normocephalic and atraumatic.  Mouth/Throat: Oropharynx is clear and moist.  Eyes: EOM are normal. Pupils are equal, round, and reactive to light. Right conjunctiva is injected. Left conjunctiva is injected. No scleral icterus.  Neck: Normal range of motion. Neck supple.  Cardiovascular: Regular rhythm, normal heart sounds and intact distal pulses.  Tachycardia present.   Pulmonary/Chest: Effort normal and breath sounds normal. No respiratory distress.  Abdominal: Soft. Bowel sounds are normal. There is no tenderness.  Musculoskeletal: Normal range of motion. He exhibits no edema.  Neurological: He is alert and oriented to person, place, and time. He displays no tremor. He displays no seizure activity.  Skin: Skin is warm and dry.  Psychiatric: His speech is slurred. He is withdrawn. He exhibits a depressed mood. He expresses no homicidal and no suicidal ideation.    ED Course  Procedures (including critical care time)  Labs Reviewed  CBC - Abnormal; Notable for the following:    RDW 16.3 (*)    All other components within normal limits  COMPREHENSIVE METABOLIC PANEL - Abnormal; Notable for the following:    Total Protein 8.7 (*)    AST 60 (*)    Total Bilirubin 0.2 (*)    All other components within normal limits  ETHANOL - Abnormal; Notable for the following:    Alcohol, Ethyl (B) 382 (*)    All other components within normal limits  SALICYLATE LEVEL - Abnormal; Notable for the following:    Salicylate  Lvl <2.0 (*)    All other components within normal limits  ACETAMINOPHEN LEVEL  URINE RAPID DRUG SCREEN (HOSP PERFORMED)   No results found.   1. Alcohol intoxication       MDM  52 y/o male requesting detox from alcohol. Hx positive for withdrawal seizures. Unsure if he hit his head- obtaining CT scan. Patient on CIWA protocol- ETOH level 382. ACT team consulted and will evaluate patient. Dr. Juleen China aware of patient. Patient care will be assumed by Ivar Drape, PA-C at shift change.  Trevor Mace, PA-C 06/08/12 2006

## 2012-06-08 NOTE — ED Notes (Signed)
Pt from home accompanied by family requesting detox. Pt intoxicated presently with slurred speech noted. Pt reports that he drank cooking wine, unable to tell this nurse exact amount, right before coming to hospital.

## 2012-06-09 ENCOUNTER — Inpatient Hospital Stay (HOSPITAL_COMMUNITY)
Admission: EM | Admit: 2012-06-09 | Discharge: 2012-06-12 | DRG: 897 | Disposition: A | Discharge status: Home / Self Care | Payer: Federal, State, Local not specified - Other | Source: Intra-hospital | Attending: Psychiatry | Admitting: Psychiatry

## 2012-06-09 ENCOUNTER — Encounter (HOSPITAL_COMMUNITY): Payer: Self-pay | Admitting: *Deleted

## 2012-06-09 DIAGNOSIS — F310 Bipolar disorder, current episode hypomanic: Secondary | ICD-10-CM | POA: Diagnosis present

## 2012-06-09 DIAGNOSIS — F319 Bipolar disorder, unspecified: Secondary | ICD-10-CM | POA: Diagnosis present

## 2012-06-09 DIAGNOSIS — F10129 Alcohol abuse with intoxication, unspecified: Secondary | ICD-10-CM

## 2012-06-09 DIAGNOSIS — Z79899 Other long term (current) drug therapy: Secondary | ICD-10-CM

## 2012-06-09 DIAGNOSIS — F1020 Alcohol dependence, uncomplicated: Secondary | ICD-10-CM

## 2012-06-09 DIAGNOSIS — F10239 Alcohol dependence with withdrawal, unspecified: Secondary | ICD-10-CM

## 2012-06-09 LAB — RAPID URINE DRUG SCREEN, HOSP PERFORMED
Amphetamines: NOT DETECTED
Barbiturates: NOT DETECTED
Benzodiazepines: NOT DETECTED
Cocaine: NOT DETECTED
Opiates: NOT DETECTED
Tetrahydrocannabinol: NOT DETECTED

## 2012-06-09 MED ORDER — HYDROXYZINE PAMOATE 50 MG PO CAPS
50.0000 mg | ORAL_CAPSULE | Freq: Two times a day (BID) | ORAL | Status: DC | PRN
  Administered 2012-06-09: 50 mg via ORAL
  Filled 2012-06-09: qty 1

## 2012-06-09 MED ORDER — ALUM & MAG HYDROXIDE-SIMETH 200-200-20 MG/5ML PO SUSP: 30.0000 mL | ORAL | Status: DC | PRN

## 2012-06-09 MED ORDER — VITAMIN B-1 100 MG PO TABS
100.0000 mg | ORAL_TABLET | Freq: Every day | ORAL | Status: DC
  Administered 2012-06-10 – 2012-06-12 (×3): 100 mg via ORAL
  Filled 2012-06-09 (×5): qty 1

## 2012-06-09 MED ORDER — QUETIAPINE FUMARATE 100 MG PO TABS: 100.0000 mg | ORAL_TABLET | Freq: Every day | ORAL | Status: DC

## 2012-06-09 MED ORDER — MAGNESIUM HYDROXIDE 400 MG/5ML PO SUSP: 30.0000 mL | Freq: Every day | ORAL | Status: DC | PRN

## 2012-06-09 MED ORDER — GABAPENTIN 600 MG PO TABS
600.0000 mg | ORAL_TABLET | Freq: Two times a day (BID) | ORAL | Status: DC
  Administered 2012-06-09 – 2012-06-12 (×6): 600 mg via ORAL
  Filled 2012-06-09: qty 28
  Filled 2012-06-09 (×2): qty 1
  Filled 2012-06-09: qty 28
  Filled 2012-06-09 (×6): qty 1

## 2012-06-09 MED ORDER — IBUPROFEN 600 MG PO TABS
600.0000 mg | ORAL_TABLET | Freq: Three times a day (TID) | ORAL | Status: DC | PRN
  Filled 2012-06-09: qty 1

## 2012-06-09 MED ORDER — THIAMINE HCL 100 MG/ML IJ SOLN: 100.0000 mg | Freq: Once | INTRAMUSCULAR | Status: DC

## 2012-06-09 MED ORDER — LOPERAMIDE HCL 2 MG PO CAPS: 2.0000 mg | ORAL_CAPSULE | ORAL | Status: AC | PRN

## 2012-06-09 MED ORDER — ADULT MULTIVITAMIN W/MINERALS CH
1.0000 | ORAL_TABLET | Freq: Every day | ORAL | Status: DC
  Administered 2012-06-09 – 2012-06-12 (×4): 1 via ORAL
  Filled 2012-06-09 (×6): qty 1

## 2012-06-09 MED ORDER — QUETIAPINE FUMARATE 200 MG PO TABS
200.0000 mg | ORAL_TABLET | Freq: Every day | ORAL | Status: DC
  Administered 2012-06-09 – 2012-06-11 (×3): 200 mg via ORAL
  Filled 2012-06-09: qty 1
  Filled 2012-06-09: qty 14
  Filled 2012-06-09 (×3): qty 1

## 2012-06-09 MED ORDER — CHLORDIAZEPOXIDE HCL 25 MG PO CAPS
50.0000 mg | ORAL_CAPSULE | Freq: Once | ORAL | Status: AC
  Administered 2012-06-09: 50 mg via ORAL
  Filled 2012-06-09: qty 2

## 2012-06-09 MED ORDER — NICOTINE 21 MG/24HR TD PT24: 21.0000 mg | MEDICATED_PATCH | Freq: Every day | TRANSDERMAL | Status: DC

## 2012-06-09 MED ORDER — HYDROXYZINE HCL 25 MG PO TABS
25.0000 mg | ORAL_TABLET | Freq: Four times a day (QID) | ORAL | Status: AC | PRN
  Administered 2012-06-09 – 2012-06-10 (×2): 25 mg via ORAL

## 2012-06-09 MED ORDER — ONDANSETRON HCL 4 MG PO TABS
4.0000 mg | ORAL_TABLET | Freq: Three times a day (TID) | ORAL | Status: DC | PRN
  Administered 2012-06-09: 4 mg via ORAL
  Filled 2012-06-09: qty 1

## 2012-06-09 MED ORDER — GABAPENTIN 300 MG PO CAPS
300.0000 mg | ORAL_CAPSULE | Freq: Two times a day (BID) | ORAL | Status: DC
  Administered 2012-06-09: 300 mg via ORAL
  Filled 2012-06-09 (×2): qty 1

## 2012-06-09 MED ORDER — ONDANSETRON 4 MG PO TBDP: 4.0000 mg | ORAL_TABLET | Freq: Four times a day (QID) | ORAL | Status: AC | PRN

## 2012-06-09 MED ORDER — HYDROXYZINE PAMOATE 50 MG PO CAPS
50.0000 mg | ORAL_CAPSULE | Freq: Two times a day (BID) | ORAL | Status: DC | PRN
  Filled 2012-06-09 (×2): qty 20

## 2012-06-09 MED ORDER — LORAZEPAM 2 MG/ML IJ SOLN
1.0000 mg | Freq: Once | INTRAMUSCULAR | Status: AC
  Administered 2012-06-09: 1 mg via INTRAMUSCULAR

## 2012-06-09 MED ORDER — CHLORDIAZEPOXIDE HCL 25 MG PO CAPS
25.0000 mg | ORAL_CAPSULE | Freq: Four times a day (QID) | ORAL | Status: AC | PRN
  Administered 2012-06-10: 25 mg via ORAL
  Filled 2012-06-09 (×2): qty 1

## 2012-06-09 MED ORDER — LORAZEPAM 2 MG/ML IJ SOLN
1.0000 mg | Freq: Once | INTRAMUSCULAR | Status: DC
  Filled 2012-06-09: qty 1

## 2012-06-09 MED ORDER — ACETAMINOPHEN 325 MG PO TABS: 650.0000 mg | ORAL_TABLET | Freq: Four times a day (QID) | ORAL | Status: DC | PRN

## 2012-06-09 MED ORDER — LORAZEPAM 1 MG PO TABS
1.0000 mg | ORAL_TABLET | ORAL | Status: AC
  Administered 2012-06-09: 1 mg via ORAL

## 2012-06-09 NOTE — Progress Notes (Signed)
Pt attended AA group; flat affect; appeared attentive.

## 2012-06-09 NOTE — Clinical Social Work Note (Signed)
Pt lacks transportation to [facility].  Pt not physically able to ride bus/train to [place]- he is going through active withdrawal.  Psychiatrist does not feel comfortable with train ride- recommends admission to [facility].  CSW to complete support paperwork.  [facility] notified.  Vickii Penna, LCSWA [phone]  Clinical Social Work

## 2012-06-09 NOTE — Progress Notes (Signed)
Patient ID: Craig [name], male   DOB: [birth year], 52 y.o.   MRN: [id number] Patient denies SI/HI and A/V hallucinations; patient came in requesting etoh detox and etoh level was 382 on admission; patient reports a history of withdrawal seizures; patient is homeless and reports his only support systems is his few friends and his mother has cancer so she can not support himand his father and brother are deceased; patient is also unemployed and patient states he has been married and divorced four times and has two adult children and 92 50 year old twin girls;patient was started on the librium protocol

## 2012-06-09 NOTE — ED Notes (Signed)
Patient arrived to unit. Pt with severe tremors and sweating. Dr. Dierdre Highman notified; new orders received.

## 2012-06-09 NOTE — [facility] Suicide Risk Assessment (Signed)
Suicide Risk Assessment  Admission Assessment     Nursing information obtained from:  Patient Demographic factors:  Male Current Mental Status:  Self-harm thoughts Loss Factors:  Decrease in vocational status;Financial problems / change in socioeconomic status Historical Factors:  Family history of mental illness or substance abuse Risk Reduction Factors:  Responsible for children under 50 years of age;Sense of responsibility to family;Religious beliefs about death  CLINICAL FACTORS:   Bipolar Disorder:   Depressive phase Alcohol/Substance Abuse/Dependencies  COGNITIVE FEATURES THAT CONTRIBUTE TO RISK:  Closed-mindedness Thought constriction (tunnel vision)    SUICIDE RISK:   Moderate:  Frequent suicidal ideation with limited intensity, and duration, some specificity in terms of plans, no associated intent, good self-control, limited dysphoria/symptomatology, some risk factors present, and identifiable protective factors, including available and accessible social support.  PLAN OF CARE: Supportive approach/coping skills/relapse prevention                               Librium Detox protocol  I certify that inpatient services furnished can reasonably be expected to improve the patient's condition.  Nirvana Blanchett A 06/09/2012, 4:52 PM
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
## 2012-06-09 NOTE — Progress Notes (Signed)
[facility] LCSW Group Therapy  06/09/2012 1:15 PM  Type of Therapy:  Group Therapy 1:15 to 2:30 PM   Participation Level:  Active  Participation Quality:  Appropriate, Attentive and Sharing  Affect:  Flat  Cognitive:  Alert and Oriented  Insight:  Developing/Improving  Engagement in Therapy:  Engaged  Modes of Intervention:  Discussion, Exploration, Rapport Building, Socialization and Support  Summary of Progress/Problems: Group session included an educational portion on Post Acute Withdrawal Syndrome (PAWS).  Patients were able to process the information and share feelings related to length of time recovery takes. Craig [name] was able to process his first day of relapse following 3 months of sobriety and shared how "there was no real trigger, I just don't know what happened yet I got it into my head to drink and next thing I know I was drunk."  Craig [name] was able to identify negative results of relapse.    Craig [name]

## 2012-06-09 NOTE — [facility] Counselor (Signed)
Patient accepted to [facility] by Dr. Dub Mikes to Dr. Dub Mikes and will be going into 300[facility].

## 2012-06-09 NOTE — ED Notes (Signed)
Family took belogings home

## 2012-06-09 NOTE — Progress Notes (Signed)
D.  Pt pleasant on approach, denies complaints at this time.  Positive for evening group.  Interacting appropriately within milieu.  Denies SI/HI/hallucinations at this time.  A.   Support and encouragement offered  R.  Remains safe on unit, will continue to monitor.
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
## 2012-06-09 NOTE — ED Provider Notes (Signed)
Medical screening examination/treatment/procedure(s) were performed by non-physician practitioner and as supervising physician I was immediately available for consultation/collaboration.  Raeford Razor, MD 06/09/12 [phone]

## 2012-06-09 NOTE — Tx Team (Signed)
Initial Interdisciplinary Treatment Plan  PATIENT STRENGTHS: (choose at least two) Ability for insight Average or above average intelligence  PATIENT STRESSORS: Financial difficulties Occupational concerns Substance abuse   PROBLEM LIST: Problem List/Patient Goals Date to be addressed Date deferred Reason deferred Estimated date of resolution  Depression 06/09/2012     Substance abuse 06/09/2012                                                DISCHARGE CRITERIA:  Ability to meet basic life and health needs Improved stabilization in mood, thinking, and/or behavior Withdrawal symptoms are absent or subacute and managed without 24-hour nursing intervention  PRELIMINARY DISCHARGE PLAN:    PATIENT/FAMIILY INVOLVEMENT: This treatment plan has been presented to and reviewed with the patient, Craig [name]. The patient and family have been given the opportunity to ask questions and make suggestions.  Leighton Parody M 06/09/2012, 11:22 AM

## 2012-06-09 NOTE — ED Notes (Signed)
Patient sleeping; no s/s of distress noted. Medication effective.
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
## 2012-06-09 NOTE — ED Provider Notes (Signed)
52 year old male desiring alcohol detox. Has slept the entire night in the emergency department. Ethanol level is now appropriate for the patient to be moved to psych ED. He has been on CIWA protocol, and has not had any overnight events.  5:15 AM Patient moved to the psych ED.  [facility] is aware of the patient.  Roxy Horseman, PA-C 06/09/12 [phone]

## 2012-06-10 DIAGNOSIS — F10229 Alcohol dependence with intoxication, unspecified: Secondary | ICD-10-CM

## 2012-06-10 DIAGNOSIS — F3110 Bipolar disorder, current episode manic without psychotic features, unspecified: Secondary | ICD-10-CM

## 2012-06-10 DIAGNOSIS — F1020 Alcohol dependence, uncomplicated: Principal | ICD-10-CM

## 2012-06-10 MED ORDER — LISINOPRIL 20 MG PO TABS: 20.0000 mg | ORAL_TABLET | Freq: Once | ORAL | Status: DC

## 2012-06-10 MED ORDER — LIDOCAINE 5 % EX PTCH
1.0000 | MEDICATED_PATCH | CUTANEOUS | Status: DC
  Administered 2012-06-10 – 2012-06-11 (×2): 1 via TRANSDERMAL
  Filled 2012-06-10 (×4): qty 1
  Filled 2012-06-10: qty 14

## 2012-06-10 MED ORDER — LISINOPRIL 10 MG PO TABS
10.0000 mg | ORAL_TABLET | Freq: Every day | ORAL | Status: DC
  Administered 2012-06-10: 10 mg via ORAL
  Filled 2012-06-10 (×4): qty 1

## 2012-06-10 MED ORDER — LISINOPRIL 10 MG PO TABS: 10.0000 mg | ORAL_TABLET | Freq: Every day | ORAL | Status: DC

## 2012-06-10 NOTE — Progress Notes (Signed)
D.  Pt pleasant on approach, concerned about his Neurontin.  Pt is used to taking his second dose at bedtime and instead it was given today at 1730.  Pt inquired if this could be changed for tomorrow.  Denies SI/HI/hallucinations Interacting appropriately within milieu.  Positive for evening AA group.  A.  Support and encouragement offered, pharmacy notified of time request for Neurontin.  R.  Pt remains safe on unit, will continue to monitor.

## 2012-06-10 NOTE — Clinical Social Work Note (Signed)
[facility] Group Notes:  (Clinical Social Work)  06/10/2012     10-11AM  Summary of Progress/Problems:   The main focus of today's process group was for the patient to identify ways in which they have in the past sabotaged their own recovery. Motivational Interviewing was utilized to ask the group members what they get out of their substance use, and what they want to change.  The Stages of Change were explained, and members identified where they currently are with regard to stages of change.  The patient expressed that he has been through similar programs many, many times, with Granier periods of sobriety many times.  He is never aware, has never been able to identify any triggers.  He has a diagnosis of Bipolar Disorder, normally stays on the manic side, so does not feel that is a trigger.  Has tried Antabuse unsuccessfully.  His motivation to change is 10 out of 10 and he is currently in preparation stage.  He wants therapy desperately.  Type of Therapy:  Group Therapy - Process   Participation Level:  Active  Participation Quality:  Appropriate, Attentive, Sharing and Supportive  Affect:  Appropriate  Cognitive:  Alert, Appropriate and Oriented  Insight:  Engaged  Engagement in Therapy:  Engaged  Modes of Intervention:  Education, Teacher, English as a foreign language, Exploration, Discussion, Motivational Interviewing   Ambrose Mantle, LCSW 06/10/2012, 11:13 AM

## 2012-06-10 NOTE — Progress Notes (Signed)
Adult Psychoeducational Group Note  Date:  06/10/2012 Time:  0900  Group Topic/Focus:  Self Inventory Group Review  Participation Level:  Active  Participation Quality:  Appropriate, Attentive, Sharing and Supportive  Affect:  Appropriate  Cognitive:  Alert, Appropriate and Oriented  Insight: Appropriate, Good and Improving  Engagement in Group:  Developing/Improving, Engaged, Improving and Supportive  Modes of Intervention:  Discussion, Exploration and Support  Additional Comments:  Patient discussed his alcohol abuse, programs, disability, family losses.  Encouraged other patients to continue treatment also.   Quintella Reichert [facility] 06/10/2012, 10:28 AM

## 2012-06-10 NOTE — Progress Notes (Signed)
Patient did attend the evening speaker AA meeting.  
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
## 2012-06-10 NOTE — Progress Notes (Signed)
[facility] MD Progress Note  06/10/2012 1:16 PM OLLEN RAO  MRN:  [id number]  Subjective: Mr. Alcock reports today that he feeling some better. Slept better last night. Still complaining of increased pain episodes. He adds that his feeling a little down is because of his pain. rated his feeling down at #4. Denies any SI.  Diagnosis:   Axis I: Bipolar affective disorder, current episode hypomanic, Alcohol dependence, alcohol abuse with intoxication Axis II: Deferred Axis III:  Past Medical History  Diagnosis Date  . Back pain   . Neck pain   . Bipolar 1 disorder   . ETOH abuse   . Withdrawal seizures    Axis IV: other psychosocial or environmental problems Axis V: 41-50 serious symptoms  ADL's:  Improved  Sleep: Fair  Appetite:  Good  Suicidal Ideation:  Plan:  No Intent:  No Means:  No Homicidal Ideation:  Plan:  No Intent:  No Means:  No AEB (as evidenced by):  Psychiatric Specialty Exam: Review of Systems  Constitutional: Negative.   HENT: Negative.   Respiratory: Negative.   Cardiovascular: Negative.   Gastrointestinal: Negative.   Genitourinary: Negative.   Musculoskeletal: Positive for myalgias, back pain and joint pain.  Skin: Negative.   Neurological: Negative.   Endo/Heme/Allergies: Negative.   Psychiatric/Behavioral: Positive for substance abuse. Negative for depression, suicidal ideas, hallucinations and memory loss. The patient is nervous/anxious. The patient does not have insomnia.     Blood pressure 144/98, pulse 108, temperature 97.8 F (36.6 C), temperature source Oral, resp. rate 16, height 5' 9.29" (1.76 m), weight 103.42 kg (228 lb).Body mass index is 33.39 kg/(m^2).  General Appearance: Casual  Eye Contact::  Good  Speech:  Clear and Coherent  Volume:  Normal  Mood:  Depressed, rated #3  Affect:  Restricted  Thought Process:  Coherent and Intact  Orientation:  Full (Time, Place, and Person)  Thought Content:  Rumination  Suicidal Thoughts:  No   Homicidal Thoughts:  No  Memory:  Immediate;   Good Recent;   Good Remote;   Good  Judgement:  Fair  Insight:  Fair  Psychomotor Activity:  mild tremors  Concentration:  Fair  Recall:  Good  Akathisia:  No  Handed:  Right  AIMS (if indicated):     Assets:  Desire for Improvement  Sleep:  Number of Hours: 6.5   Current Medications: Current Facility-Administered Medications  Medication Dose Route Frequency Provider Last Rate Last Dose  . acetaminophen (TYLENOL) tablet 650 mg  650 mg Oral Q6H PRN Rachael Fee, MD      . alum & mag hydroxide-simeth (MAALOX/MYLANTA) 200-200-20 MG/5ML suspension 30 mL  30 mL Oral Q4H PRN Rachael Fee, MD      . chlordiazePOXIDE (LIBRIUM) capsule 25 mg  25 mg Oral Q6H PRN Rachael Fee, MD      . gabapentin (NEURONTIN) tablet 600 mg  600 mg Oral BID Sanjuana Kava, NP   600 mg at 06/10/12 0818  . hydrOXYzine (ATARAX/VISTARIL) tablet 25 mg  25 mg Oral Q6H PRN Rachael Fee, MD   25 mg at 06/09/12 2201  . hydrOXYzine (VISTARIL) capsule 50 mg  50 mg Oral BID PRN Sanjuana Kava, NP      . lidocaine (LIDODERM) 5 % 1 patch  1 patch Transdermal Q24H Sanjuana Kava, NP      . lisinopril (PRINIVIL,ZESTRIL) tablet 10 mg  10 mg Oral Daily Sanjuana Kava, NP      .  loperamide (IMODIUM) capsule 2-4 mg  2-4 mg Oral PRN Rachael Fee, MD      . magnesium hydroxide (MILK OF MAGNESIA) suspension 30 mL  30 mL Oral Daily PRN Rachael Fee, MD      . multivitamin with minerals tablet 1 tablet  1 tablet Oral Daily Rachael Fee, MD   1 tablet at 06/10/12 0818  . ondansetron (ZOFRAN-ODT) disintegrating tablet 4 mg  4 mg Oral Q6H PRN Rachael Fee, MD      . QUEtiapine (SEROQUEL) tablet 200 mg  200 mg Oral QHS Sanjuana Kava, NP   200 mg at 06/09/12 2200  . thiamine (B-1) injection 100 mg  100 mg Intramuscular Once Rachael Fee, MD      . thiamine (VITAMIN B-1) tablet 100 mg  100 mg Oral Daily Rachael Fee, MD   100 mg at 06/10/12 0454    Lab Results:  Results for orders  placed during the hospital encounter of 06/08/12 (from the past 48 hour(s))  ACETAMINOPHEN LEVEL     Status: None   Collection Time    06/08/12  6:10 PM      Result Value Range   Acetaminophen (Tylenol), Serum <15.0  10 - 30 ug/mL   Comment:            THERAPEUTIC CONCENTRATIONS VARY     SIGNIFICANTLY. A RANGE OF 10-30     ug/mL MAY BE AN EFFECTIVE     CONCENTRATION FOR MANY PATIENTS.     HOWEVER, SOME ARE BEST TREATED     AT CONCENTRATIONS OUTSIDE THIS     RANGE.     ACETAMINOPHEN CONCENTRATIONS     >150 ug/mL AT 4 HOURS AFTER     INGESTION AND >50 ug/mL AT 12     HOURS AFTER INGESTION ARE     OFTEN ASSOCIATED WITH TOXIC     REACTIONS.  CBC     Status: Abnormal   Collection Time    06/08/12  6:10 PM      Result Value Range   WBC 7.1  4.0 - 10.5 K/uL   RBC 4.94  4.22 - 5.81 MIL/uL   Hemoglobin 14.8  13.0 - 17.0 g/dL   HCT 09.8  11.9 - 14.7 %   MCV 87.7  78.0 - 100.0 fL   MCH 30.0  26.0 - 34.0 pg   MCHC 34.2  30.0 - 36.0 g/dL   RDW 82.9 (*) 56.2 - 13.0 %   Platelets 244  150 - 400 K/uL  COMPREHENSIVE METABOLIC PANEL     Status: Abnormal   Collection Time    06/08/12  6:10 PM      Result Value Range   Sodium 144  135 - 145 mEq/L   Potassium 4.1  3.5 - 5.1 mEq/L   Chloride 104  96 - 112 mEq/L   CO2 23  19 - 32 mEq/L   Glucose, Bld 98  70 - 99 mg/dL   BUN 11  6 - 23 mg/dL   Creatinine, Ser 8.65  0.50 - 1.35 mg/dL   Calcium 8.5  8.4 - 78.4 mg/dL   Total Protein 8.7 (*) 6.0 - 8.3 g/dL   Albumin 3.9  3.5 - 5.2 g/dL   AST 60 (*) 0 - 37 U/L   ALT 44  0 - 53 U/L   Alkaline Phosphatase 94  39 - 117 U/L   Total Bilirubin 0.2 (*) 0.3 - 1.2 mg/dL   GFR calc  non Af Amer >90  >90 mL/min   GFR calc Af Amer >90  >90 mL/min   Comment:            The eGFR has been calculated     using the CKD EPI equation.     This calculation has not been     validated in all clinical     situations.     eGFR's persistently     <90 mL/min signify     possible Chronic Kidney Disease.   ETHANOL     Status: Abnormal   Collection Time    06/08/12  6:10 PM      Result Value Range   Alcohol, Ethyl (B) 382 (*) 0 - 11 mg/dL   Comment:            LOWEST DETECTABLE LIMIT FOR     SERUM ALCOHOL IS 11 mg/dL     FOR MEDICAL PURPOSES ONLY  SALICYLATE LEVEL     Status: Abnormal   Collection Time    06/08/12  6:10 PM      Result Value Range   Salicylate Lvl <2.0 (*) 2.8 - 20.0 mg/dL  ETHANOL     Status: Abnormal   Collection Time    06/08/12 10:30 PM      Result Value Range   Alcohol, Ethyl (B) 293 (*) 0 - 11 mg/dL   Comment:            LOWEST DETECTABLE LIMIT FOR     SERUM ALCOHOL IS 11 mg/dL     FOR MEDICAL PURPOSES ONLY  URINE RAPID DRUG SCREEN (HOSP PERFORMED)     Status: None   Collection Time    06/09/12  3:35 AM      Result Value Range   Opiates NONE DETECTED  NONE DETECTED   Cocaine NONE DETECTED  NONE DETECTED   Benzodiazepines NONE DETECTED  NONE DETECTED   Amphetamines NONE DETECTED  NONE DETECTED   Tetrahydrocannabinol NONE DETECTED  NONE DETECTED   Barbiturates NONE DETECTED  NONE DETECTED   Comment:            DRUG SCREEN FOR MEDICAL PURPOSES     ONLY.  IF CONFIRMATION IS NEEDED     FOR ANY PURPOSE, NOTIFY LAB     WITHIN 5 DAYS.                LOWEST DETECTABLE LIMITS     FOR URINE DRUG SCREEN     Drug Class       Cutoff (ng/mL)     Amphetamine      1000     Barbiturate      200     Benzodiazepine   200     Tricyclics       300     Opiates          300     Cocaine          300     THC              50  ETHANOL     Status: Abnormal   Collection Time    06/09/12  3:55 AM      Result Value Range   Alcohol, Ethyl (B) 124 (*) 0 - 11 mg/dL   Comment:            LOWEST DETECTABLE LIMIT FOR     SERUM ALCOHOL IS 11 mg/dL     FOR MEDICAL PURPOSES  ONLY    Physical Findings: AIMS:  , ,  ,  ,    CIWA:  CIWA-Ar Total: 4 COWS:     Treatment Plan Summary: Daily contact with patient to assess and evaluate symptoms and progress in  treatment Medication management  Plan: Supportive approach/coping skills/relapse prevention. Will initiate Lidocaine patch to lower back for pain. Start Lisinopril 10 mg daily for hypertension. Encouraged out of room, participation in group sessions and application of coping skills when distressed. Will continue to monitor response to/adverse effects of medications in use to assure effectiveness. Continue to monitor mood, behavior and interaction with staff and other patients. Continue current plan of care.  Medical Decision Making Problem Points:  New problem, with additional work-up planned (4), Review of last therapy session (1) and Review of psycho-social stressors (1) Data Points:  Review and summation of old records (2) Review of medication regiment & side effects (2) Review of new medications or change in dosage (2)  I certify that inpatient services furnished can reasonably be expected to improve the patient's condition.   Armandina Stammer I 06/10/2012, 1:16 PM

## 2012-06-11 DIAGNOSIS — F411 Generalized anxiety disorder: Secondary | ICD-10-CM

## 2012-06-11 DIAGNOSIS — F1994 Other psychoactive substance use, unspecified with psychoactive substance-induced mood disorder: Secondary | ICD-10-CM

## 2012-06-11 DIAGNOSIS — F1910 Other psychoactive substance abuse, uncomplicated: Secondary | ICD-10-CM

## 2012-06-11 MED ORDER — NABUMETONE 500 MG PO TABS
500.0000 mg | ORAL_TABLET | Freq: Two times a day (BID) | ORAL | Status: DC
  Administered 2012-06-11 – 2012-06-12 (×3): 500 mg via ORAL
  Filled 2012-06-11: qty 1
  Filled 2012-06-11: qty 28
  Filled 2012-06-11 (×5): qty 1
  Filled 2012-06-11: qty 28

## 2012-06-11 MED ORDER — METHOCARBAMOL 500 MG PO TABS
500.0000 mg | ORAL_TABLET | Freq: Three times a day (TID) | ORAL | Status: DC
  Administered 2012-06-11 – 2012-06-12 (×4): 500 mg via ORAL
  Filled 2012-06-11 (×9): qty 1

## 2012-06-11 MED ORDER — HYDROCHLOROTHIAZIDE 25 MG PO TABS
25.0000 mg | ORAL_TABLET | Freq: Every day | ORAL | Status: DC
  Administered 2012-06-11 – 2012-06-12 (×2): 25 mg via ORAL
  Filled 2012-06-11 (×3): qty 1
  Filled 2012-06-11: qty 14

## 2012-06-11 MED ORDER — POTASSIUM CHLORIDE CRYS ER 10 MEQ PO TBCR
10.0000 meq | EXTENDED_RELEASE_TABLET | Freq: Every day | ORAL | Status: DC
Start: 2012-06-11 — End: 2012-06-12
  Administered 2012-06-11 – 2012-06-12 (×2): 10 meq via ORAL
  Filled 2012-06-11 (×3): qty 1
  Filled 2012-06-11: qty 14

## 2012-06-11 NOTE — Progress Notes (Signed)
Patient did attend the evening speaker AA meeting.  

## 2012-06-11 NOTE — Progress Notes (Signed)
D slept well last nite, appetite is good and eating in the DR, energy level is high almost manic he states, abililty to pay attention is good, he is not depressed or hoepeless today, WD s/s include agitation and back pain, he denies Si or Hi today, his pain today has a goal of 5/10, taking his meds as ordered by MD, attending groups today and participating, participating w/peers in dayroom, watching basketball games and other TV shows. A q75min safety checks continue and support offered to patient, encouraged to attend group to get teh most out of the groups as possible, R patient remains safe on the unit

## 2012-06-11 NOTE — Progress Notes (Signed)
Adult Psychoeducational Group Note  Date:  06/11/2012 Time:  15:15pm  Group Topic/Focus:  Making Healthy Choices:   The focus of this group is to help patients identify negative/unhealthy choices they were using prior to admission and identify positive/healthier coping strategies to replace them upon discharge.  Participation Level:  Active  Participation Quality:  Appropriate, Sharing and Supportive  Affect:  Appropriate  Cognitive:  Appropriate  Insight: Appropriate  Engagement in Group:  Engaged and Supportive  Modes of Intervention:  Discussion, Education, Problem-solving and Support  Additional Comments:  none  Hiba Garry, Genia Plants 06/11/2012, 4:07 PM

## 2012-06-11 NOTE — Clinical Social Work Note (Signed)
[facility] Group Notes:  (Clinical Social Work)  06/11/2012  10:00-11:00AM  Summary of Progress/Problems:   The main focus of today's process group was to   identify the patient's current support system and decide on other supports that can be put in place to prevent future hospitalizations.  A handout was used to explain the 4 definitions/levels of support and to think about what support patient has given and received from others.  An emphasis was placed on using counselor, doctor, therapy groups, 12-step groups, and problem-specific support groups to expand supports. The patient expressed that he was bored, that this topic had already been covered yesterday.  He was reading the workbook for today instead.  CSW asked him several times to cover his chest, as his gown kept falling open.  He stated he did not have a shirt, and CSW pointed out his gown could be tied at the neck, but he did not do this.  Type of Therapy:  Process Group with Motivational Interviewing  Participation Level:  Minimal  Participation Quality:  Inattentive and Resistant  Affect:  Appropriate  Cognitive:  Oriented  Insight:  Limited  Engagement in Therapy:  Lacking  Modes of Intervention:  Clarification, Education, Limit-setting, Problem-solving, Socialization, Support and Processing, Exploration, Discussion, Role-Play   Ambrose Mantle, LCSW 06/11/2012, 10:43 AM

## 2012-06-11 NOTE — Progress Notes (Signed)
[facility] MD Progress Note  06/11/2012 9:20 AM Craig [name]  MRN:  [id number] Subjective:  Slept "very well, feel rested."  Appetite greatly improved with three meals and snacks.  SSI came through 2 weeks ago and will go live with a friend and finally have a stable place to live.  He denies depression, describes himself on the "mania side".  Anxiety continues but considers himself to "borderline hyper all the time."  [name] continues to have back pain, medications placed (see daily plan).  He complained of facial flushing and felt heart pounding in his head after the lisinopril and others noticed.  This was discontinued and HCTZ added with potassium. Diagnosis:   Axis I: Anxiety Disorder NOS, Substance Abuse and Substance Induced Mood Disorder Axis II: Deferred Axis III:  Past Medical History  Diagnosis Date  . Back pain   . Neck pain   . Bipolar 1 disorder   . ETOH abuse   . Withdrawal seizures    Axis IV: economic problems, occupational problems, other psychosocial or environmental problems, problems related to social environment and problems with primary support group Axis V: 41-50 serious symptoms  ADL's:  Intact  Sleep: Fair  Appetite:  Good  Suicidal Ideation:  Denies Homicidal Ideation:  Denies  Psychiatric Specialty Exam: Review of Systems  Constitutional: Negative.   HENT: Negative.   Eyes: Negative.   Respiratory: Negative.   Cardiovascular: Negative.   Gastrointestinal: Negative.   Genitourinary: Negative.   Musculoskeletal: Positive for back pain.  Skin: Negative.   Neurological: Negative.   Endo/Heme/Allergies: Negative.   Psychiatric/Behavioral: Positive for substance abuse. The patient is nervous/anxious.     Blood pressure 122/83, pulse 111, temperature 96.5 F (35.8 C), temperature source Oral, resp. rate 18, height 5' 9.29" (1.76 m), weight 103.42 kg (228 lb).Body mass index is 33.39 kg/(m^2).  General Appearance: Casual  Eye Contact::  Fair  Speech:   Normal Rate  Volume:  Normal  Mood:  Anxious  Affect:  Congruent  Thought Process:  Coherent  Orientation:  Full (Time, Place, and Person)  Thought Content:  WDL  Suicidal Thoughts:  No  Homicidal Thoughts:  No  Memory:  Immediate;   Fair Recent;   Fair Remote;   Fair  Judgement:  Fair  Insight:  Fair  Psychomotor Activity:  Increased  Concentration:  Fair  Recall:  Fair  Akathisia:  No  Handed:  Left  AIMS (if indicated):     Assets:  Resilience Social Support  Sleep:  Number of Hours: 6.5   Current Medications: Current Facility-Administered Medications  Medication Dose Route Frequency Provider Last Rate Last Dose  . acetaminophen (TYLENOL) tablet 650 mg  650 mg Oral Q6H PRN Rachael Fee, MD      . alum & mag hydroxide-simeth (MAALOX/MYLANTA) 200-200-20 MG/5ML suspension 30 mL  30 mL Oral Q4H PRN Rachael Fee, MD      . chlordiazePOXIDE (LIBRIUM) capsule 25 mg  25 mg Oral Q6H PRN Rachael Fee, MD   25 mg at 06/10/12 2154  . gabapentin (NEURONTIN) tablet 600 mg  600 mg Oral BID Sanjuana Kava, NP   600 mg at 06/11/12 0750  . hydrOXYzine (ATARAX/VISTARIL) tablet 25 mg  25 mg Oral Q6H PRN Rachael Fee, MD   25 mg at 06/10/12 2153  . hydrOXYzine (VISTARIL) capsule 50 mg  50 mg Oral BID PRN Sanjuana Kava, NP      . lidocaine (LIDODERM) 5 % 1 patch  1  patch Transdermal Q24H Sanjuana Kava, NP   1 patch at 06/10/12 1552  . lisinopril (PRINIVIL,ZESTRIL) tablet 10 mg  10 mg Oral Daily Sanjuana Kava, NP   10 mg at 06/10/12 1549  . loperamide (IMODIUM) capsule 2-4 mg  2-4 mg Oral PRN Rachael Fee, MD      . magnesium hydroxide (MILK OF MAGNESIA) suspension 30 mL  30 mL Oral Daily PRN Rachael Fee, MD      . multivitamin with minerals tablet 1 tablet  1 tablet Oral Daily Rachael Fee, MD   1 tablet at 06/11/12 0750  . ondansetron (ZOFRAN-ODT) disintegrating tablet 4 mg  4 mg Oral Q6H PRN Rachael Fee, MD      . QUEtiapine (SEROQUEL) tablet 200 mg  200 mg Oral QHS Sanjuana Kava, NP    200 mg at 06/10/12 2153  . thiamine (B-1) injection 100 mg  100 mg Intramuscular Once Rachael Fee, MD      . thiamine (VITAMIN B-1) tablet 100 mg  100 mg Oral Daily Rachael Fee, MD   100 mg at 06/11/12 0750    Lab Results: No results found for this or any previous visit (from the past 48 hour(s)).  Physical Findings: AIMS:  , ,  ,  ,    CIWA:  CIWA-Ar Total: 5 COWS:     Treatment Plan Summary: Daily contact with patient to assess and evaluate symptoms and progress in treatment Medication management  Plan:  Review of chart, vital signs, medications, and notes. 1-Individual and group therapy 2-Medication management for depression and anxiety:  Medications reviewed with the patient and lisinopril discontinued due to side effects--HCTZ started with potassium, Relafen and Robaxin ordered for his back pain 3-Coping skills for depression, anxiety, alcohol abuse, and pain 4-Continue crisis stabilization and management 5-Address health issues--monitoring vital signs, elevated but had bad side effects on lisinopril--discontinued and HCTZ ordered with potassium 6-Treatment plan in progress to prevent relapse of depression, alcohol abuse, and anxiety  Medical Decision Making Problem Points:  Established problem, stable/improving (1) and Review of psycho-social stressors (1) Data Points:  Review of new medications or change in dosage (2)  I certify that inpatient services furnished can reasonably be expected to improve the patient's condition.   Nanine Means, PMH-NP 06/11/2012, 9:20 AM

## 2012-06-11 NOTE — Progress Notes (Signed)
D. Pt pleasant on approach, states that new medications started for his chronic pain have done "wonders" for him.  Pt is very pleased. Denies SI/HI/hallucinations at this time.  Pt is interacting appropriately within milieu.  Positive for evening group.  A.  Support and encouragement offered  R.  Pt remains safe on unit, will continue to monitor.

## 2012-06-11 NOTE — Progress Notes (Signed)
Adult Psychoeducational Group Note  Date: 06/10/2012  Time: 11:00AM  Group Topic/Focus:  Healthy Communication: The focus of this group is to discuss communication, barriers to communication, as well as healthy ways to communicate with others.  Participation Level: Active  Participation Quality: Appropriate, Sharing and Supportive  Affect: Appropriate  Cognitive: Appropriate  Insight: Appropriate  Engagement in Group: Engaged and Supportive  Modes of Intervention: Education, Problem-solving and Support  Additional Comments: Dorothea Ogle  06/11/2012, 1:05 PM

## 2012-06-11 NOTE — Progress Notes (Addendum)
[facility] Group Notes:  (Nursing/MHT/Case Management/Adjunct)  Date:  06/11/2012  Time:  0930  Type of Therapy:  Self inventory sheets.  Participation Level:  Active  Participation Quality:  Appropriate, Attentive and Sharing  Affect:  Appropriate  Cognitive:  Appropriate  Insight:  Appropriate  Engagement in Group:  Engaged and Supportive  Modes of Intervention:  Clarification, Discussion, Exploration, Problem-solving and Socialization  Summary of Progress/Problems: was outspoken in group and very informative about his support.  Roselee Culver 06/11/2012, 3:57 PM

## 2012-06-11 NOTE — Progress Notes (Signed)
Adult Psychoeducational Group Note  Date:  06/11/2012 Time:  1315  Group Topic/Focus:  Making Healthy Choices:   The focus of this group is to help patients identify negative/unhealthy choices they were using prior to admission and identify positive/healthier coping strategies to replace them upon discharge.  Participation Level:  Active  Participation Quality:  Appropriate, Attentive and Sharing  Affect:  Appropriate  Cognitive:  Appropriate  Insight: Appropriate  Engagement in Group:  Developing/Improving, Engaged and Supportive  Modes of Intervention:  Clarification, Discussion, Exploration, Problem-solving and Socialization  Additional Comments:  Very involved in group and discussion. Roselee Culver 06/11/2012, 4:01 PM

## 2012-06-12 MED ORDER — METHOCARBAMOL 500 MG PO TABS: 500.0000 mg | ORAL_TABLET | Freq: Three times a day (TID) | ORAL | Status: DC

## 2012-06-12 MED ORDER — POTASSIUM CHLORIDE CRYS ER 10 MEQ PO TBCR: 10.0000 meq | EXTENDED_RELEASE_TABLET | Freq: Every day | ORAL | Status: DC

## 2012-06-12 MED ORDER — HYDROXYZINE PAMOATE 50 MG PO CAPS: 50.0000 mg | ORAL_CAPSULE | Freq: Two times a day (BID) | ORAL | Status: DC | PRN

## 2012-06-12 MED ORDER — QUETIAPINE FUMARATE 200 MG PO TABS: 200.0000 mg | ORAL_TABLET | Freq: Every day | ORAL | Status: DC

## 2012-06-12 MED ORDER — GABAPENTIN 600 MG PO TABS: 600.0000 mg | ORAL_TABLET | Freq: Two times a day (BID) | ORAL | Status: DC

## 2012-06-12 MED ORDER — LIDOCAINE 5 % EX PTCH: 1.0000 | MEDICATED_PATCH | CUTANEOUS | Status: DC

## 2012-06-12 MED ORDER — HYDROCHLOROTHIAZIDE 25 MG PO TABS: 25.0000 mg | ORAL_TABLET | Freq: Every day | ORAL | Status: DC

## 2012-06-12 NOTE — Tx Team (Signed)
Interdisciplinary Treatment Plan Update (Adult)  Date: 06/12/2012  Time Reviewed: 9:42 AM   Progress in Treatment: Attending groups: Yes Participating in groups: Yes Taking medication as prescribed:  Yes Tolerating medication:  Yes Family/Significant othe contact made: Yes, girlfriend Patient understands diagnosis: Yes Discussing patient identified problems/goals with staff: Yes Medical problems stabilized or resolved:  Yes Denies suicidal/homicidal ideation: Yes Patient has not harmed self or Others: Yes  New problem(s) identified: None Identified  Discharge Plan or Barriers:  CSW is assessing for appropriate referrals.   Additional comments: N/A  Reason for Continuation of Hospitalization: NA  Estimated length of stay: Discharge Today  For review of initial/current patient goals, please see plan of care.  Attendees: Patient:     Family:     Physician:  NP Serena Colonel, NP 06/12/2012 9:42 AM   Nursing:   Roswell Miners, RN 06/12/2012 9:42 AM   Clinical Social Worker Ronda Fairly 06/12/2012 9:42 AM   Other:  Robbie Louis, RN  06/12/2012 9:42 AM   Other:  Stephan Minister Liason 06/12/2012 9:42 AM   Other:   06/12/2012 9:42 AM   Other:   06/12/2012 9:42 AM    Scribe for Treatment Team:   Carney Bern, LCSWA  06/12/2012 9:42 AM
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
## 2012-06-12 NOTE — Progress Notes (Signed)
[facility] LCSW Aftercare Discharge Planning Group Note  06/12/2012 8:45 AM  Participation Quality:  Appropriate  Affect:  Appropriate  Cognitive:  Appropriate  Insight:  Improving  Engagement in Group:  Improving  Modes of Intervention:  Clarification, Exploration, Socialization and Support  Summary of Progress/Problems: Pt denies both suicidal and homicidal ideation.  On a scale of 1 to 10 with ten being the most ever experienced, the patient rates depression at a -1 and anxiety at a 3. Patient reports he feels good about discharge and has transportation.  He and girlfriend will be getting an apartment together in either [place] or [place] and he is willing to attend AA meetings along with follow up at [facility].    Clide Dales

## 2012-06-12 NOTE — [facility] Suicide Risk Assessment (Signed)
Suicide Risk Assessment  Discharge Assessment     Demographic Factors:  Male, Low socioeconomic status and Unemployed  Mental Status Per Nursing Assessment::   On Admission:  Self-harm thoughts  Current Mental Status by Physician: patient denies suicidal ideations, intent or plan  Loss Factors: Decrease in vocational status and Financial problems/change in socioeconomic status  Historical Factors: poor impulse control  Risk Reduction Factors:   Positive social support, Positive coping skills or problem solving skills and planning to live with his girlfriend after discharge  Continued Clinical Symptoms:  Bipolar Disorder:   Depressive phase Alcohol/Substance Abuse/Dependencies  Cognitive Features That Contribute To Risk:  Closed-mindedness    Suicide Risk:  Minimal: No identifiable suicidal ideation.  Patients presenting with no risk factors but with morbid ruminations; may be classified as minimal risk based on the severity of the depressive symptoms  Discharge Diagnoses:   AXIS I:  Alcohol abuse with intoxication. Bipolar affective disorder  AXIS II:  Deferred AXIS III:   Past Medical History  Diagnosis Date  . Back pain   . Neck pain   . Withdrawal seizures    AXIS IV:  economic problems, housing problems, other psychosocial or environmental problems and problems related to social environment AXIS V:  61-70 mild symptoms  Plan Of Care/Follow-up recommendations:  Activity:  as tolerated Diet:  healthy Tests:  routine blood test Other:  patient to keep his after care appointment  Is patient on multiple antipsychotic therapies at discharge:  No   Has Patient had three or more failed trials of antipsychotic monotherapy by history:  No  Recommended Plan for Multiple Antipsychotic Therapies: N/A  Thedore Mins, MD 06/12/2012, 10:44 AM

## 2012-06-12 NOTE — Progress Notes (Signed)
[facility] Adult Case Management Discharge Plan :  Will you be returning to the same living situation after discharge: No. Will not be returning to streets yet will make home with significant other At discharge, do you have transportation home?:Yes,  girlfriend Do you have the ability to pay for your medications:Yes,  through [facility]  Release of information consent forms completed and in the chart;  Patient's signature needed at discharge.  Patient to Follow up at: Follow-up Information   Follow up with [facility] in [place] On 06/15/2012. (Appointment is Tghursday 3/37/04 at 1:00 PM)    Contact information:   [address] [place], [place] [address] PH [phone] FAX [phone]      Patient denies SI/HI:   Yes,  Denies both    Safety Planning and Suicide Prevention discussed:  No. Not applicable  Clide Dales 06/12/2012, 4:32 PM

## 2012-06-12 NOTE — ED Provider Notes (Signed)
Medical screening examination/treatment/procedure(s) were performed by non-physician practitioner and as supervising physician I was immediately available for consultation/collaboration.  Phinehas Grounds, MD 06/12/12 0841 

## 2012-06-12 NOTE — Progress Notes (Signed)
Patient ID: Craig [name], male   DOB: May 04, 1960, 51 y.o.   MRN: [id number] CSW spoke with patient's girlfriend Laurell Josephs, at [phone].  Ms Roseanne Reno reports she will provide transportation for patient and they will be looking for housing in [place] area. Mobile crisis services explained and contact card placed in chart for pt to receive at discharge. Carney Bern, LCSWA

## 2012-06-13 NOTE — Discharge Summary (Signed)
Seen and agreed. Amenah Tucci, MD 

## 2012-06-15 NOTE — Progress Notes (Signed)
Patient Discharge Instructions:  After Visit Summary (AVS):   Faxed to:  06/15/12 Discharge Summary Note:   Faxed to:  06/15/12 Psychiatric Admission Assessment Note:   Faxed to:  06/15/12 Suicide Risk Assessment - Discharge Assessment:   Faxed to:  06/15/12 Faxed/Sent to the Next Level Care provider:  06/15/12 Faxed to [facility] @ [phone]  [name], 06/15/2012, 4:15 PM

## 2012-06-29 ENCOUNTER — Encounter (HOSPITAL_COMMUNITY): Payer: Self-pay | Admitting: *Deleted

## 2012-06-29 ENCOUNTER — Emergency Department (HOSPITAL_COMMUNITY)
Admission: EM | Admit: 2012-06-29 | Discharge: 2012-06-30 | Disposition: A | Discharge status: Home / Self Care | Payer: Medicaid Other | Attending: Emergency Medicine | Admitting: Emergency Medicine

## 2012-06-29 ENCOUNTER — Emergency Department (HOSPITAL_COMMUNITY): Payer: Medicaid Other

## 2012-06-29 DIAGNOSIS — F1010 Alcohol abuse, uncomplicated: Secondary | ICD-10-CM | POA: Insufficient documentation

## 2012-06-29 DIAGNOSIS — Z8669 Personal history of other diseases of the nervous system and sense organs: Secondary | ICD-10-CM | POA: Insufficient documentation

## 2012-06-29 DIAGNOSIS — M549 Dorsalgia, unspecified: Secondary | ICD-10-CM | POA: Insufficient documentation

## 2012-06-29 DIAGNOSIS — IMO0002 Reserved for concepts with insufficient information to code with codable children: Secondary | ICD-10-CM | POA: Insufficient documentation

## 2012-06-29 DIAGNOSIS — F319 Bipolar disorder, unspecified: Secondary | ICD-10-CM | POA: Insufficient documentation

## 2012-06-29 DIAGNOSIS — W19XXXA Unspecified fall, initial encounter: Secondary | ICD-10-CM | POA: Insufficient documentation

## 2012-06-29 DIAGNOSIS — F10129 Alcohol abuse with intoxication, unspecified: Secondary | ICD-10-CM

## 2012-06-29 DIAGNOSIS — M542 Cervicalgia: Secondary | ICD-10-CM | POA: Insufficient documentation

## 2012-06-29 DIAGNOSIS — Y9389 Activity, other specified: Secondary | ICD-10-CM | POA: Insufficient documentation

## 2012-06-29 DIAGNOSIS — Y9289 Other specified places as the place of occurrence of the external cause: Secondary | ICD-10-CM | POA: Insufficient documentation

## 2012-06-29 LAB — CBC WITH DIFFERENTIAL/PLATELET
Basophils Absolute: 0 10*3/uL (ref 0.0–0.1)
Basophils Relative: 0 % (ref 0–1)
Eosinophils Absolute: 0.2 10*3/uL (ref 0.0–0.7)
Eosinophils Relative: 3 % (ref 0–5)
Lymphs Abs: 2.2 10*3/uL (ref 0.7–4.0)
MCH: 30.5 pg (ref 26.0–34.0)
MCHC: 33.7 g/dL (ref 30.0–36.0)
MCV: 90.5 fL (ref 78.0–100.0)
Neutrophils Relative %: 47 % (ref 43–77)
Platelets: 230 10*3/uL (ref 150–400)
RBC: 4.1 MIL/uL — ABNORMAL LOW (ref 4.22–5.81)
RDW: 16.6 % — ABNORMAL HIGH (ref 11.5–15.5)

## 2012-06-29 LAB — COMPREHENSIVE METABOLIC PANEL
ALT: 38 U/L (ref 0–53)
AST: 30 U/L (ref 0–37)
Albumin: 3.4 g/dL — ABNORMAL LOW (ref 3.5–5.2)
Alkaline Phosphatase: 70 U/L (ref 39–117)
Calcium: 8.8 mg/dL (ref 8.4–10.5)
Potassium: 3.7 mEq/L (ref 3.5–5.1)
Sodium: 145 mEq/L (ref 135–145)
Total Protein: 7.4 g/dL (ref 6.0–8.3)

## 2012-06-29 LAB — RAPID URINE DRUG SCREEN, HOSP PERFORMED
Amphetamines: NOT DETECTED
Barbiturates: NOT DETECTED
Benzodiazepines: NOT DETECTED
Cocaine: NOT DETECTED

## 2012-06-29 LAB — ETHANOL: Alcohol, Ethyl (B): 339 mg/dL — ABNORMAL HIGH (ref 0–11)

## 2012-06-29 LAB — URINALYSIS, ROUTINE W REFLEX MICROSCOPIC
Bilirubin Urine: NEGATIVE
Glucose, UA: NEGATIVE mg/dL
Ketones, ur: NEGATIVE mg/dL
Nitrite: NEGATIVE
Specific Gravity, Urine: 1.009 (ref 1.005–1.030)
pH: 6.5 (ref 5.0–8.0)

## 2012-06-29 MED ORDER — THIAMINE HCL 100 MG/ML IJ SOLN
Freq: Once | INTRAVENOUS | Status: AC
  Administered 2012-06-29: via INTRAVENOUS
  Filled 2012-06-29: qty 1000

## 2012-06-29 MED ORDER — ADULT MULTIVITAMIN W/MINERALS CH: 1.0000 | ORAL_TABLET | Freq: Every day | ORAL | Status: DC

## 2012-06-29 MED ORDER — LORAZEPAM 1 MG PO TABS: 1.0000 mg | ORAL_TABLET | Freq: Four times a day (QID) | ORAL | Status: DC | PRN

## 2012-06-29 MED ORDER — LORAZEPAM 2 MG/ML IJ SOLN: 1.0000 mg | Freq: Four times a day (QID) | INTRAMUSCULAR | Status: DC | PRN

## 2012-06-29 NOTE — ED Notes (Signed)
WUJ:[id number]<JY> Expected date:06/29/12<BR> Expected time: 9:51 PM<BR> Means of arrival:Ambulance<BR> Comments:<BR> ETOH
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
## 2012-06-30 NOTE — ED Notes (Signed)
Patient is alert and oriented x3.  He was given DC instructions and follow up visit instructions.  Patient gave verbal understanding.  He was DC ambulatory under his own power to home.  V/S stable.  He was not showing any signs of distress on DC 

## 2012-06-30 NOTE — ED Provider Notes (Signed)
Patient arrived with multiple falls and very intoxicated. Neck CT was unremarkable, patient not complaining about any pain at 0628 in the morning. At this time, he is alert, oriented x4, very gracious, apologetic, thinks the ER staff for taking care of him.  By the patient has something to eat drink some fluids and have discharged him stable and good condition.  Jones Skene, MD 06/30/12 0630

## 2013-02-18 ENCOUNTER — Other Ambulatory Visit: Payer: Self-pay

## 2013-02-18 ENCOUNTER — Encounter (HOSPITAL_COMMUNITY): Payer: Self-pay | Admitting: Emergency Medicine

## 2013-02-18 ENCOUNTER — Emergency Department (HOSPITAL_COMMUNITY)
Admission: EM | Admit: 2013-02-18 | Discharge: 2013-02-19 | Disposition: A | Discharge status: Home / Self Care | Payer: Medicaid Other | Attending: Emergency Medicine | Admitting: Emergency Medicine

## 2013-02-18 DIAGNOSIS — F10129 Alcohol abuse with intoxication, unspecified: Secondary | ICD-10-CM

## 2013-02-18 DIAGNOSIS — F1010 Alcohol abuse, uncomplicated: Secondary | ICD-10-CM | POA: Insufficient documentation

## 2013-02-18 DIAGNOSIS — Z79899 Other long term (current) drug therapy: Secondary | ICD-10-CM | POA: Insufficient documentation

## 2013-02-18 DIAGNOSIS — R229 Localized swelling, mass and lump, unspecified: Secondary | ICD-10-CM | POA: Insufficient documentation

## 2013-02-18 DIAGNOSIS — Z8669 Personal history of other diseases of the nervous system and sense organs: Secondary | ICD-10-CM | POA: Insufficient documentation

## 2013-02-18 DIAGNOSIS — F319 Bipolar disorder, unspecified: Secondary | ICD-10-CM | POA: Insufficient documentation

## 2013-02-18 LAB — BASIC METABOLIC PANEL
BUN: 11 mg/dL (ref 6–23)
Calcium: 8.6 mg/dL (ref 8.4–10.5)
GFR calc Af Amer: >90 mL/min (ref >90)
GFR calc non Af Amer: >90 mL/min (ref >90)
Glucose, Bld: 92 mg/dL (ref 70–99)
Potassium: 3.6 mEq/L (ref 3.5–5.1)
Sodium: 144 mEq/L (ref 135–145)

## 2013-02-18 LAB — CBC
HCT: 36.1 % — ABNORMAL LOW (ref 39.0–52.0)
Hemoglobin: 12.5 g/dL — ABNORMAL LOW (ref 13.0–17.0)
MCH: 33.1 pg (ref 26.0–34.0)
MCHC: 34.6 g/dL (ref 30.0–36.0)
RDW: 13.8 % (ref 11.5–15.5)
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
## 2013-02-18 NOTE — ED Notes (Signed)
Bed: WA08 Expected date:  Expected time:  Means of arrival:  Comments: EMS - ETOH/seizure

## 2013-02-18 NOTE — ED Provider Notes (Signed)
CSN: [id number]     Arrival date & time 02/18/13  1716 History   First MD Initiated Contact with Patient 02/18/13 1718     Chief Complaint  Patient presents with  . Alcohol Intoxication   (Consider location/radiation/quality/duration/timing/severity/associated sxs/prior Treatment) HPI Comments: Patient with h/o alcohol abuse, seizures -- presents by EMS with alcohol intoxication. Friend found patient in her car with cooking wine, called EMS. No reported seizure. Level V caveat due to alcohol intoxication.  Patient is a 52 y.o. male presenting with intoxication. The history is provided by the patient.  Alcohol Intoxication    Past Medical History  Diagnosis Date  . Back pain   . Neck pain   . Bipolar 1 disorder   . ETOH abuse   . Withdrawal seizures    History reviewed. No pertinent past surgical history. No family history on file. History  Substance Use Topics  . Smoking status: Never Smoker   . Smokeless tobacco: Never Used  . Alcohol Use: Yes     Comment: ETOH abuse    Review of Systems  Unable to perform ROS: Mental status change    Allergies  Haldol and Hydrocodone  Home Medications   Current Outpatient Rx  Name  Route  Sig  Dispense  Refill  . gabapentin (NEURONTIN) 600 MG tablet   Oral   Take 1 tablet (600 mg total) by mouth 2 (two) times daily. For anxiety/pain episodes.   60 tablet   0   . hydrochlorothiazide (HYDRODIURIL) 25 MG tablet   Oral   Take 1 tablet (25 mg total) by mouth daily. For control of high blood pressure   30 tablet   0   . hydrOXYzine (VISTARIL) 50 MG capsule   Oral   Take 1 capsule (50 mg total) by mouth 2 (two) times daily as needed for anxiety.   60 capsule   0   . lidocaine (LIDODERM) 5 %   Transdermal   Place 1 patch onto the skin daily. Remove & Discard patch within 12 hours or as directed by MD: For pain control   5 patch   0   . methocarbamol (ROBAXIN) 500 MG tablet   Oral   Take 1 tablet (500 mg total) by mouth  3 (three) times daily. For muscle spaspms   90 tablet   0   . potassium chloride (K-DUR,KLOR-CON) 10 MEQ tablet   Oral   Take 1 tablet (10 mEq total) by mouth daily. For low potassium   30 tablet   0   . QUEtiapine (SEROQUEL) 200 MG tablet   Oral   Take 1 tablet (200 mg total) by mouth at bedtime. For mood control   30 tablet   0    BP 116/82  Pulse 97  Temp(Src) 97.8 F (36.6 C) (Oral)  SpO2 91% Physical Exam  Nursing note and vitals reviewed. Constitutional: He appears well-developed and well-nourished.  Pt swearing at provider during exam, speech is slurred.   HENT:  Head: Normocephalic and atraumatic. Head is without raccoon's eyes and without Battle's sign.  Right Ear: Tympanic membrane, external ear and ear canal normal. No hemotympanum.  Left Ear: Tympanic membrane, external ear and ear canal normal. No hemotympanum.  Nose: Nose normal. No nasal septal hematoma.  Mouth/Throat: Oropharynx is clear and moist.  No trauma to tongue. No obvious trauma to head or face.   Eyes: Conjunctivae, EOM and lids are normal. Pupils are equal, round, and reactive to light.  No visible hyphema  Neck: Normal range of motion. Neck supple.  Cardiovascular: Normal rate and regular rhythm.   Pulmonary/Chest: Effort normal and breath sounds normal.  Abdominal: Soft. There is no tenderness.  Musculoskeletal: Normal range of motion.       Cervical back: He exhibits normal range of motion, no tenderness and no bony tenderness.       Thoracic back: He exhibits no tenderness and no bony tenderness.       Lumbar back: He exhibits no tenderness and no bony tenderness.       Back:  Neurological: He is alert. He has normal reflexes. He is disoriented (unclear as to events of the day). No cranial nerve deficit (no gross deficits). Coordination normal. GCS eye subscore is 4. GCS verbal subscore is 5. GCS motor subscore is 6.  Gross movement of extremities intact. Patient acting intoxicated.     Skin: Skin is warm and dry.  Psychiatric: He has a normal mood and affect.    ED Course  Procedures (including critical care time) Labs Review Labs Reviewed  CBC - Abnormal; Notable for the following:    RBC 3.78 (*)    Hemoglobin 12.5 (*)    HCT 36.1 (*)    All other components within normal limits  ETHANOL - Abnormal; Notable for the following:    Alcohol, Ethyl (B) 413 (*)    All other components within normal limits  BASIC METABOLIC PANEL   Imaging Review No results found.  EKG Interpretation   None      5:39 PM Patient seen and examined. Work-up initiated. Will check labs as precaution. No signs of head injury, exam consistent with intoxication. Seizure precautions taken. Will hold imaging.    Vital signs reviewed and are as follows: Filed Vitals:   02/18/13 1728  BP: 116/82  Pulse: 97  Temp: 97.8 F (36.6 C)   8:50 PM Pt sleeping.  10:17 PM Patient sleeping.   12:48 AM Attempted to ambulate patient but still appropriately intoxicated. He is asking for water now. As he is still intoxicated, will need more time to sober up. Handoff to Dr. Oletta Lamas who will re-eval in morning. No clinical deterioration.   MDM   1. Alcohol abuse with intoxication     Alcohol intoxication.     Renne Crigler, PA-C 02/19/13 [phone]

## 2013-02-19 NOTE — Discharge Instructions (Signed)
Alcohol Problems °Most adults who drink alcohol drink in moderation (not a lot) are at low risk for developing problems related to their drinking. However, all drinkers, including low-risk drinkers, should know about the health risks connected with drinking alcohol. °RECOMMENDATIONS FOR LOW-RISK DRINKING  °Drink in moderation. Moderate drinking is defined as follows:  °· Men - no more than 2 drinks per day. °· Nonpregnant women - no more than 1 drink per day. °· Over age 65 - no more than 1 drink per day. °A standard drink is 12 grams of pure alcohol, which is equal to a 12 ounce bottle of beer or wine cooler, a 5 ounce glass of wine, or 1.5 ounces of distilled spirits (such as whiskey, brandy, vodka, or rum).  °ABSTAIN FROM (DO NOT DRINK) ALCOHOL: °· When pregnant or considering pregnancy. °· When taking a medication that interacts with alcohol. °· If you are alcohol dependent. °· A medical condition that prohibits drinking alcohol (such as ulcer, liver disease, or heart disease). °DISCUSS WITH YOUR CAREGIVER: °· If you are at risk for coronary heart disease, discuss the potential benefits and risks of alcohol use: Light to moderate drinking is associated with lower rates of coronary heart disease in certain populations (for example, men over age 45 and postmenopausal women). Infrequent or nondrinkers are advised not to begin light to moderate drinking to reduce the risk of coronary heart disease so as to avoid creating an alcohol-related problem. Similar protective effects can likely be gained through proper diet and exercise. °· Women and the elderly have smaller amounts of body water than men. As a result women and the elderly achieve a higher blood alcohol concentration after drinking the same amount of alcohol. °· Exposing a fetus to alcohol can cause a broad range of birth defects referred to as Fetal Alcohol Syndrome (FAS) or Alcohol-Related Birth Defects (ARBD). Although FAS/ARBD is connected with excessive  alcohol consumption during pregnancy, studies also have reported neurobehavioral problems in infants born to mothers reporting drinking an average of 1 drink per day during pregnancy. °· Heavier drinking (the consumption of more than 4 drinks per occasion by men and more than 3 drinks per occasion by women) impairs learning (cognitive) and psychomotor functions and increases the risk of alcohol-related problems, including accidents and injuries. °CAGE QUESTIONS:  °· Have you ever felt that you should Cut down on your drinking? °· Have people Annoyed you by criticizing your drinking? °· Have you ever felt bad or Guilty about your drinking? °· Have you ever had a drink first thing in the morning to steady your nerves or get rid of a hangover (Eye opener)? °If you answered positively to any of these questions: You may be at risk for alcohol-related problems if alcohol consumption is:  °· Men: Greater than 14 drinks per week or more than 4 drinks per occasion. °· Women: Greater than 7 drinks per week or more than 3 drinks per occasion. °Do you or your family have a medical history of alcohol-related problems, such as: °· Blackouts. °· Sexual dysfunction. °· Depression. °· Trauma. °· Liver dysfunction. °· Sleep disorders. °· Hypertension. °· Chronic abdominal pain. °· Has your drinking ever caused you problems, such as problems with your family, problems with your work (or school) performance, or accidents/injuries? °· Do you have a compulsion to drink or a preoccupation with drinking? °· Do you have poor control or are you unable to stop drinking once you have started? °· Do you have to drink to   avoid withdrawal symptoms?  Do you have problems with withdrawal such as tremors, nausea, sweats, or mood disturbances?  Does it take more alcohol than in the past to get you high?  Do you feel a strong urge to drink?  Do you change your plans so that you can have a drink?  Do you ever drink in the morning to relieve  the shakes or a hangover? If you have answered a number of the previous questions positively, it may be time for you to talk to your caregivers, family, and friends and see if they think you have a problem. Alcoholism is a chemical dependency that keeps getting worse and will eventually destroy your health and relationships. Many alcoholics end up dead, impoverished, or in prison. This is often the end result of all chemical dependency.  Do not be discouraged if you are not ready to take action immediately.  Decisions to change behavior often involve up and down desires to change and feeling like you cannot decide.  Try to think more seriously about your drinking behavior.  Think of the reasons to quit. WHERE TO GO FOR ADDITIONAL INFORMATION   The National Institute on Alcohol Abuse and Alcoholism (NIAAA) BasicStudents.dk  [employer] on Alcoholism and Drug Dependence (NCADD) www.ncadd.org  American Society of Addiction Medicine (ASAM) RoyalDiary.gl  Document Released: 03/08/2005 Document Revised: 05/31/2011 Document Reviewed: 10/25/2007 [facility] Patient Information 2014 [place], [place].   RESOURCE GUIDE  Chronic Pain Problems: Contact [facility]  [phone] Patients need to be referred by their primary care doctor.  Insufficient Money for Medicine: Contact United Way:  call [phone]  No Primary Care Doctor: - Call Health Connect  [phone] - can help you locate a primary care doctor that  accepts your insurance, provides certain services, etc. - Physician Referral Service- [phone]  Agencies that provide inexpensive medical care: - [facility]  [phone] - [facility]  [phone] - [facility]  [phone] - Women's Clinic  [phone] - Planned Parenthood  [phone] [facility]  [phone]  Medicaid-accepting [facility] Providers: - [facility]- [address],  [address]  [phone], Mon-Fri 9am-7pm, Sat 9am-1pm - [facility]- [address] [place], [address] [place]  [phone] - [facility]- [address] [place]  [phone] [facility]- [address]  [phone] - Renaye Rakers- [address] [place], [address], [phone]  Only accepts [place] patients after they have their name  applied to their card  Self Pay (no insurance) in [place]: - Sickle Cell Patients - [facility]  [address] [place], [phone] - [facility]- [address] [place]  [phone]       [facility] [place]- [address]       -     [facility]- see information above (Speak to [employer] if you do not have insurance)       -  [facility]- [address],  [phone]       -  [facility]- [address] [phone]       -  Dr Julio Sicks-  [address], [place], [phone]       -  [facility] - [address] [phone]       -  [facility]- [address], [phone], also [address] [phone]       -     [facility]- [address] [place], [phone], 1st & 3rd Saturday         every month, 10am-1pm  -     [facility]   [address] [place], [place].   Phone:  [phone], Fax:  [phone]. Hours of Operation:  9 am - 6 pm, M-F.  -     [facility]   [address], [place]   Phone: [phone], Fax: [phone]. Hours of Operation:  8:30 am - 5:30 pm, M-F.  [facility] [address] [place], [place] [address] [phone]  [facility] [address] [place], [place] [address] [phone]  1) Find a Doctor and Pay Out of Pocket Although you won't have to find out who is covered by your insurance plan, it is a good idea to ask around  and get recommendations. You will then need to call the office and see if the doctor you have chosen will accept you as a new patient and what types of options they offer for patients who are self-pay. Some doctors offer discounts or will set up payment plans for their patients who do not have insurance, but you will need to ask so you aren't surprised when you get to your appointment.  2) Contact Your Local Health Department Not all health departments have doctors that can see patients for sick visits, but many do, so it is worth a call to see if yours does. If you don't know where your local health department is, you can check in your phone book. The CDC also has a tool to help you locate your state's health department, and many state websites also have listings of all of their local health departments.  3) Find a Walk-in Clinic If your illness is not likely to be very severe or complicated, you may want to try a walk in clinic. These are popping up all over the country in pharmacies, drugstores, and shopping centers. They're usually staffed by nurse practitioners or physician assistants that have been trained to treat common illnesses and complaints. They're usually fairly quick and inexpensive. However, if you have serious medical issues or chronic medical problems, these are probably not your best option  STD Testing - [facility] Department of [facility] [place], STD Clinic, [address] [place], phone [phone] or [phone].  Monday - Friday, call for an appointment. [facility] of [employer], STD Clinic, [address], [place], phone [phone] or [phone].  Monday - Friday, call for an appointment.  Abuse/Neglect: [facility] Child Abuse Hotline (972)510-7791 [facility] Child Abuse Hotline 520-445-9819 (After Hours)  Emergency Shelter:  [facility] 936-453-8296  Maternity Homes: - Room at the  [facility] (509)131-5886 - [facility] [phone]  MRSA Hotline #:   415-089-0855  Dental Assistance If unable to pay or uninsured, contact:  [facility]. to become qualified for the adult dental clinic.  Patients with Medicaid: [facility] [phone] [address], [phone] [address] [phone]  If unable to pay, or uninsured, contact [facility] [phone] in [place], [phone] in [facility]) to become qualified for the adult dental clinic  [facility] [address] [place], [place] [address] [phone] [email or web address]  Other Proofreader Services: - [facility]- [address] [place], [place], [place], [address], [phone], Ext. 123, 2nd and 4th Thursday of the month at 6:30am.  10 clients each day by appointment, can sometimes see walk-in patients if someone does not show for an appointment. [facility]- [address] [place], [place], [address], [phone] - [facility] [address] [place], [place], [address], [phone] - [facility]- [phone] [facility] Health Department- [phone] [facility] Health [phone]       Behavioral Health Resources in the [facility]  Intensive Outpatient Programs: [facility]      [address] [place], [place] [phone] Both a day and evening program       [facility] Out[facility]     [address]        [place], [place] [address] [phone]         ADS: [facility] [address] [place] [phone]  [facility] Mental Health ACCESS LINE: (937)804-2034 or ([phone] [address] [place], [place] [address] [email or web address].[email or web address]   Substance Abuse Resources: - Alcohol and Drug Services  581-424-2347 - [facility] (772)641-0261 - [facility] [place] [phone] Floydene Flock (817)[phone] - Residential & Outpatient Substance Abuse Program  [phone]  Psychological Services: [facility]  [phone] [facility]  ([phone] - [facility], [phone] [place]. [address], [place], ACCESS LINE: [phone] or 7021007059, EntrepreneurLoan.[email or web address]  Mobile Crisis Teams:                                        Therapeutic Alternatives         Mobile Crisis Care Unit [phone]             [facility] [address] G[place]ette Otto [phone]                                         Interventionist [address] [place] [place] [phone]  Self-Help/Support Groups: Mental Health Assoc. of [employer] of support groups 858-770-4806 (call for more info)  Narcotics Anonymous (NA) Caring Services [address] [place] - 2 meetings at this location  Residential Treatment Programs:  [facility]      [address]        [place]       [phone]         [facility] [address] [place] [address] [place], [place]  [address] [phone]  [facility]  [address] [place], [place] [address] [phone] Admissions: 8am-3pm M-F  [facility]     [address]        [place], [place] [address]       [phone]         [facility] [address] [place], [place] [phone]  [facility] [address] [place], [place] [phone]  [facility] - Intensive Outpatient      [address]     [place], [place]  [phone]         [facility] ([facility]     [address] [place], [place] [phone] or [phone]  Residential Treatment Services (RTS), Medicaid [address] [place],  [place] [phone]  Fellowship [address]                                               [address] [place] [phone]  [facility] Resources: [phone]) [phone]               General Therapy                                                Angie Fava, PhD        [address] [place], [place] [address]         [phone]   Insurance  [facility] Behavioral   [address] [place], [place] [address] [phone]  [facility] [address] [place], [place] [address] [phone] Insurance/Medicaid/sponsorship through [facility] and Families                                              [address]                                        [place], [place] [address]    Therapy/tele-psych/case         [phone]          [facility] [address], [place]  [address]  Adolescent/group home/case management [phone]                                           Creola Corn PhD       General therapy       Insurance   [phone]         Dr. Lolly Mustache, [place], M-F [phone]  [facility]  United Way [facility] Dept. [address]                 [address]         [address] [place] [address]  [address]  [facility] Phone:  [phone]                                  Phone:  [phone]                   Phone:  [phone]  [facility] [place], [phone] - [facility] - [facility]- [phone]       -     [facility] in [place], [address]             [phone], [facility] Child Abuse Hotline [phone] or [phone] (After Hours)

## 2013-02-19 NOTE — ED Provider Notes (Signed)
Medical screening examination/treatment/procedure(s) were conducted as a shared visit with non-physician practitioner(s) and myself.  I personally evaluated the patient during the encounter.  EKG Interpretation   None       Pt was intoxicated, now sober, ambulatory, has tolerated PO's.  Medically stable, no emergent condition . Pt would like to go home, he can follow up with PCP as needed, return for any other concerns as needed.    Gavin Pound. Oletta Lamas, MD 02/19/13 [phone]

## 2013-03-07 ENCOUNTER — Emergency Department (HOSPITAL_COMMUNITY)
Admission: EM | Admit: 2013-03-07 | Discharge: 2013-03-08 | Disposition: A | Discharge status: Home / Self Care | Payer: Medicaid Other | Source: Home / Self Care | Attending: Emergency Medicine | Admitting: Emergency Medicine

## 2013-03-07 ENCOUNTER — Encounter (HOSPITAL_BASED_OUTPATIENT_CLINIC_OR_DEPARTMENT_OTHER): Payer: Self-pay | Admitting: Emergency Medicine

## 2013-03-07 ENCOUNTER — Emergency Department (HOSPITAL_BASED_OUTPATIENT_CLINIC_OR_DEPARTMENT_OTHER)
Admission: EM | Admit: 2013-03-07 | Discharge: 2013-03-07 | Disposition: A | Discharge status: Home / Self Care | Payer: Medicaid Other | Attending: Emergency Medicine | Admitting: Emergency Medicine

## 2013-03-07 ENCOUNTER — Encounter (HOSPITAL_COMMUNITY): Payer: Self-pay | Admitting: Emergency Medicine

## 2013-03-07 DIAGNOSIS — Z8669 Personal history of other diseases of the nervous system and sense organs: Secondary | ICD-10-CM | POA: Insufficient documentation

## 2013-03-07 DIAGNOSIS — Z79899 Other long term (current) drug therapy: Secondary | ICD-10-CM | POA: Insufficient documentation

## 2013-03-07 DIAGNOSIS — F10929 Alcohol use, unspecified with intoxication, unspecified: Secondary | ICD-10-CM

## 2013-03-07 DIAGNOSIS — Z8659 Personal history of other mental and behavioral disorders: Secondary | ICD-10-CM | POA: Insufficient documentation

## 2013-03-07 DIAGNOSIS — F319 Bipolar disorder, unspecified: Secondary | ICD-10-CM | POA: Insufficient documentation

## 2013-03-07 DIAGNOSIS — F10129 Alcohol abuse with intoxication, unspecified: Secondary | ICD-10-CM

## 2013-03-07 DIAGNOSIS — F1010 Alcohol abuse, uncomplicated: Secondary | ICD-10-CM | POA: Insufficient documentation

## 2013-03-07 DIAGNOSIS — F1011 Alcohol abuse, in remission: Secondary | ICD-10-CM | POA: Insufficient documentation

## 2013-03-07 DIAGNOSIS — G40909 Epilepsy, unspecified, not intractable, without status epilepticus: Secondary | ICD-10-CM | POA: Insufficient documentation

## 2013-03-07 NOTE — ED Provider Notes (Signed)
Patient is a 52 year old male with history of chronic alcoholism. He was brought here today by EMS after he was found intoxicated with decreased level of consciousness. By the time he was brought here he had woken and was refusing care. He was evaluated by Dr. Lynelle Doctor initially to allow the patient to remain in the emergency department for observation until such a time he was sober and able to walk straight. He was here for several hours and is much more alert and appears appropriate for discharge.  Geoffery Lyons, MD 03/07/13 [phone]

## 2013-03-07 NOTE — ED Notes (Signed)
[facility] called for pt.  Pt left ED in NAD.  Meal tray and drink provided prior to d/c.

## 2013-03-07 NOTE — ED Provider Notes (Signed)
CSN: [id number]     Arrival date & time 03/07/13  1359 History   First MD Initiated Contact with Patient 03/07/13 1403     Chief Complaint  Patient presents with  . Alcohol Intoxication    HPI The patient was brought into the emergency room by EMS. The patient has a [place]-standing history of recurrent alcohol abuse. He was found lying down outside a commercial building. EMS and the police department were contacted. Patient admits to drinking alcohol. He denies any injuries or any other complaints. He states he would like to leave. Past Medical History  Diagnosis Date  . Back pain   . Neck pain   . Bipolar 1 disorder   . ETOH abuse   . Withdrawal seizures    History reviewed. No pertinent past surgical history. No family history on file. History  Substance Use Topics  . Smoking status: Never Smoker   . Smokeless tobacco: Never Used  . Alcohol Use: Yes     Comment: ETOH abuse admits to drinking since 0800 this morning large quantities    Review of Systems  All other systems reviewed and are negative.    Allergies  Haldol and Hydrocodone  Home Medications   Current Outpatient Rx  Name  Route  Sig  Dispense  Refill  . gabapentin (NEURONTIN) 600 MG tablet   Oral   Take 1 tablet (600 mg total) by mouth 2 (two) times daily. For anxiety/pain episodes.   60 tablet   0   . hydrochlorothiazide (HYDRODIURIL) 25 MG tablet   Oral   Take 1 tablet (25 mg total) by mouth daily. For control of high blood pressure   30 tablet   0   . hydrOXYzine (VISTARIL) 50 MG capsule   Oral   Take 1 capsule (50 mg total) by mouth 2 (two) times daily as needed for anxiety.   60 capsule   0   . lidocaine (LIDODERM) 5 %   Transdermal   Place 1 patch onto the skin daily. Remove & Discard patch within 12 hours or as directed by MD: For pain control   5 patch   0   . methocarbamol (ROBAXIN) 500 MG tablet   Oral   Take 1 tablet (500 mg total) by mouth 3 (three) times daily. For muscle  spaspms   90 tablet   0   . potassium chloride (K-DUR,KLOR-CON) 10 MEQ tablet   Oral   Take 1 tablet (10 mEq total) by mouth daily. For low potassium   30 tablet   0   . QUEtiapine (SEROQUEL) 200 MG tablet   Oral   Take 1 tablet (200 mg total) by mouth at bedtime. For mood control   30 tablet   0    BP 140/94  Pulse 99  Temp(Src) 98.6 F (37 C) (Oral)  Resp 18  Ht 6\' 1"  (1.854 m)  Wt 190 lb (86.183 kg)  BMI 25.07 kg/m2  SpO2 100% Physical Exam  Nursing note and vitals reviewed. Constitutional: He appears well-developed and well-nourished. No distress.  HENT:  Head: Normocephalic and atraumatic.  Right Ear: External ear normal.  Left Ear: External ear normal.  Alcohol odor on his breath  Eyes: Conjunctivae are normal. Right eye exhibits no discharge. Left eye exhibits no discharge. No scleral icterus.  Neck: Neck supple. No tracheal deviation present.  Cardiovascular: Normal rate, regular rhythm and intact distal pulses.   Pulmonary/Chest: Effort normal and breath sounds normal. No stridor. No respiratory distress. He  has no wheezes. He has no rales.  Abdominal: Soft. Bowel sounds are normal. He exhibits no distension. There is no tenderness. There is no rebound and no guarding.  Musculoskeletal: He exhibits no edema and no tenderness.  Neurological: He is alert. He has normal strength. He is not disoriented. No cranial nerve deficit (No facial droop) or sensory deficit. He exhibits normal muscle tone. He displays no seizure activity. Coordination abnormal.  Skin: Skin is warm and dry. No rash noted.  Psychiatric: He has a normal mood and affect.    ED Course  Procedures (including critical care time) Labs Review Labs Reviewed - No data to display Imaging Review No results found.  EKG Interpretation   None      Old records were reviewed. Multiple Similar visits to the ED previously.  MDM   1. Alcohol abuse with intoxication    1430  patient denies any  specific complaints. He does appear intoxicated. Is not unsteady in his gait. I have asked the patient to remain here longer until he is sober and is safe to be discharged  Dr Judd Lien will follow up on patient   Celene Kras, MD 03/07/13 [phone]

## 2013-03-07 NOTE — ED Notes (Signed)
Per EMS: Pt here for alcohol intoxication. Oriented x 4. No known new injuries. Pt has old abrasion on forehead.

## 2013-03-07 NOTE — ED Notes (Addendum)
Pt was found lying outside a commercial building, EMS and PD called on scene and pt brought to ED for intoxication. Pt just released from [facility] this am for same.

## 2013-03-07 NOTE — ED Provider Notes (Signed)
CSN: [id number]     Arrival date & time 03/07/13  2114 History   First MD Initiated Contact with Patient 03/07/13 2127     Chief Complaint  Patient presents with  . Alcohol Intoxication   (Consider location/radiation/quality/duration/timing/severity/associated sxs/prior Treatment) HPI Comments: 52 year old male presents with recurrent alcohol intoxication. Earlier today he was brought to an outside hospital for up PATIENT was allowed to sober up and then discharge. Per the nursing staff who talked EMS, EMS was called because this patient was intoxicated in public area and he was told to go to the hospital or go to jail. Patient has no complaints but states he drank "a lot". He states he didn't go back on drinking after he left med center [facility]. Patient denies any other symptoms and currently just wants to sleep.   Past Medical History  Diagnosis Date  . Back pain   . Neck pain   . Bipolar 1 disorder   . ETOH abuse   . Withdrawal seizures    History reviewed. No pertinent past surgical history. No family history on file. History  Substance Use Topics  . Smoking status: Never Smoker   . Smokeless tobacco: Never Used  . Alcohol Use: Yes     Comment: ETOH abuse admits to drinking since 0800 this morning large quantities    Review of Systems  Gastrointestinal: Negative for vomiting.  Neurological: Negative for weakness and headaches.  Psychiatric/Behavioral: Negative for self-injury.  All other systems reviewed and are negative.    Allergies  Haldol and Hydrocodone  Home Medications  No current outpatient prescriptions on file. BP 123/78  Pulse 93  Temp(Src) 97.8 F (36.6 C) (Oral)  Resp 18  SpO2 96% Physical Exam  Nursing note and vitals reviewed. Constitutional: He is oriented to person, place, and time. He appears well-developed and well-nourished.  HENT:  Head: Normocephalic and atraumatic.  Right Ear: External ear normal.  Left Ear: External ear normal.   Nose: Nose normal.  Eyes: Right eye exhibits no discharge. Left eye exhibits no discharge.  Neck: Neck supple.  Cardiovascular: Normal rate, regular rhythm, normal heart sounds and intact distal pulses.   Pulmonary/Chest: Effort normal and breath sounds normal.  Abdominal: Soft. There is no tenderness.  Musculoskeletal: He exhibits no edema.  Neurological: He is oriented to person, place, and time. GCS eye subscore is 3. GCS verbal subscore is 5. GCS motor subscore is 6.  Patient is sleepy but arousable  Skin: Skin is warm and dry.    ED Course  Procedures (including critical care time) Labs Review Labs Reviewed - No data to display Imaging Review No results found.  EKG Interpretation   None       MDM   1. Alcohol intoxication    52 year old male with recurrent intoxication. As on arrival patient states he drank a lot of beer did not have any other concerns for another reason for being sleepy. Does state he is improving, will discharge him when he is clinically sober. Care transferred to Dr. Fonnie Jarvis with plan to d/c when sober.    Audree Camel, MD 03/07/13 2350

## 2013-03-08 NOTE — ED Notes (Signed)
Pt noted to be walking with swift and steady gait towards exit.

## 2013-03-08 NOTE — ED Notes (Signed)
Pt awoke by Dr. Fonnie Jarvis, advised he was being d/c then left department with discharge paperwork

## 2013-03-08 NOTE — ED Provider Notes (Signed)
Homeless, no SI/HI/hallucinations, still doesn't feel sober enough for discharge. 0220 Awake alert normal speech and gait feels ready for discharge.[id number]  Hurman Horn, MD 03/09/13 2241

## 2014-10-28 ENCOUNTER — Emergency Department (HOSPITAL_COMMUNITY)
Admission: EM | Admit: 2014-10-28 | Discharge: 2014-10-28 | Disposition: A | Discharge status: Home / Self Care | Payer: Medicaid Other | Source: Home / Self Care | Attending: Emergency Medicine | Admitting: Emergency Medicine

## 2014-10-28 ENCOUNTER — Encounter (HOSPITAL_COMMUNITY): Payer: Self-pay

## 2014-10-28 ENCOUNTER — Emergency Department (HOSPITAL_COMMUNITY)
Admission: EM | Admit: 2014-10-28 | Discharge: 2014-10-28 | Disposition: A | Discharge status: Home / Self Care | Payer: Medicaid Other | Attending: Emergency Medicine | Admitting: Emergency Medicine

## 2014-10-28 DIAGNOSIS — F319 Bipolar disorder, unspecified: Secondary | ICD-10-CM | POA: Insufficient documentation

## 2014-10-28 DIAGNOSIS — F919 Conduct disorder, unspecified: Secondary | ICD-10-CM

## 2014-10-28 DIAGNOSIS — F10920 Alcohol use, unspecified with intoxication, uncomplicated: Secondary | ICD-10-CM

## 2014-10-28 DIAGNOSIS — F10120 Alcohol abuse with intoxication, uncomplicated: Secondary | ICD-10-CM | POA: Diagnosis not present

## 2014-10-28 DIAGNOSIS — F10220 Alcohol dependence with intoxication, uncomplicated: Secondary | ICD-10-CM | POA: Insufficient documentation

## 2014-10-28 DIAGNOSIS — Z79899 Other long term (current) drug therapy: Secondary | ICD-10-CM | POA: Diagnosis not present

## 2014-10-28 DIAGNOSIS — Z590 Homelessness: Secondary | ICD-10-CM | POA: Insufficient documentation

## 2014-10-28 DIAGNOSIS — F19239 Other psychoactive substance dependence with withdrawal, unspecified: Secondary | ICD-10-CM

## 2014-10-28 DIAGNOSIS — F10129 Alcohol abuse with intoxication, unspecified: Secondary | ICD-10-CM | POA: Diagnosis present

## 2014-10-28 DIAGNOSIS — F1010 Alcohol abuse, uncomplicated: Secondary | ICD-10-CM

## 2014-10-28 LAB — ETHANOL: ALCOHOL ETHYL (B): 360 mg/dL — AB (ref <5)

## 2014-10-28 NOTE — ED Provider Notes (Signed)
CSN: [id number]     Arrival date & time 10/28/14  1445 History   First MD Initiated Contact with Patient 10/28/14 1535     Chief Complaint  Patient presents with  . Alcohol Intoxication     (Consider location/radiation/quality/duration/timing/severity/associated sxs/prior Treatment) HPI Patient reports that the police brought him in because he is homeless and was in one of his "usual places" he reports he is not suicidal or homicidal. He reports this is a minor event in the scope of his history of alcohol abuse and visits to the hospital. He denies any injuries. He has no pain. He reports he was in remission from alcohol abuse and after a visit with his mother in the last couple of days he rebounded due to a negative interaction. Past Medical History  Diagnosis Date  . Back pain   . Neck pain   . Bipolar 1 disorder   . ETOH abuse   . Withdrawal seizures    History reviewed. No pertinent past surgical history. Family History  Problem Relation Age of Onset  . Hypertension Mother    History  Substance Use Topics  . Smoking status: Never Smoker   . Smokeless tobacco: Never Used  . Alcohol Use: Yes     Comment: ETOH abuse admits to drinking since 0800 this morning large quantities    Review of Systems 10 Systems reviewed and are negative for acute change except as noted in the HPI.    Allergies  Thorazine ; Haldol; Hydrocodone; and Lithium  Home Medications   Prior to Admission medications   Medication Sig Start Date End Date Taking? Authorizing Provider  clonazePAM (KLONOPIN) 1 MG tablet Take 1 mg by mouth 3 (three) times daily.   Yes Historical Provider, MD  Multiple Vitamin (MULTIVITAMIN WITH MINERALS) TABS tablet Take 1 tablet by mouth daily.   Yes Historical Provider, MD  pantoprazole (PROTONIX) 40 MG tablet Take 40 mg by mouth daily.   Yes Historical Provider, MD  QUEtiapine (SEROQUEL) 100 MG tablet Take 200 mg by mouth at bedtime.   Yes Historical Provider, MD   traZODone (DESYREL) 100 MG tablet Take 100 mg by mouth at bedtime.   Yes Historical Provider, MD   BP 121/78 mmHg  Pulse 101  Temp(Src) 98.2 F (36.8 C) (Oral)  Resp 15  SpO2 92% Physical Exam  Constitutional: He is oriented to person, place, and time. He appears well-developed and well-nourished.  HENT:  Head: Normocephalic and atraumatic.  Eyes: EOM are normal. Pupils are equal, round, and reactive to light.  Neck: Neck supple.  Cardiovascular: Normal rate, regular rhythm, normal heart sounds and intact distal pulses.   Pulmonary/Chest: Effort normal and breath sounds normal.  Abdominal: Soft. Bowel sounds are normal. He exhibits no distension. There is no tenderness.  Musculoskeletal: Normal range of motion. He exhibits no edema.  Neurological: He is alert and oriented to person, place, and time. He has normal strength. Coordination normal. GCS eye subscore is 4. GCS verbal subscore is 5. GCS motor subscore is 6.  Skin: Skin is warm, dry and intact.  Psychiatric: He has a normal mood and affect.    ED Course  Procedures (including critical care time) Labs Review Labs Reviewed - No data to display  Imaging Review No results found.   EKG Interpretation None      MDM   Final diagnoses:  Alcohol intoxication, uncomplicated  Chronic alcohol abuse   Patient is alert and nontoxic. He describes alcohol abuse chronic. No evidence  of decompensation at this point time. His speech is clear and oriented. Patient expresses a main objective in being here at the hospital would be to get a good meal. He reports he has resources for alcohol treatment and is in a group therapy.    Arby Barrette, MD 10/28/14 [phone]

## 2014-10-28 NOTE — Discharge Instructions (Signed)
Alcohol Use Disorder °Alcohol use disorder is a mental disorder. It is not a one-time incident of heavy drinking. Alcohol use disorder is the excessive and uncontrollable use of alcohol over time that leads to problems with functioning in one or more areas of daily living. People with this disorder risk harming themselves and others when they drink to excess. Alcohol use disorder also can cause other mental disorders, such as mood and anxiety disorders, and serious physical problems. People with alcohol use disorder often misuse other drugs.  °Alcohol use disorder is common and widespread. Some people with this disorder drink alcohol to cope with or escape from negative life events. Others drink to relieve chronic pain or symptoms of mental illness. People with a family history of alcohol use disorder are at higher risk of losing control and using alcohol to excess.  °SYMPTOMS  °Signs and symptoms of alcohol use disorder may include the following:  °· Consumption of alcohol in larger amounts or over a longer period of time than intended. °· Multiple unsuccessful attempts to cut down or control alcohol use.   °· A great deal of time spent obtaining alcohol, using alcohol, or recovering from the effects of alcohol (hangover). °· A strong desire or urge to use alcohol (cravings).   °· Continued use of alcohol despite problems at work, school, or home because of alcohol use.   °· Continued use of alcohol despite problems in relationships because of alcohol use. °· Continued use of alcohol in situations when it is physically hazardous, such as driving a car. °· Continued use of alcohol despite awareness of a physical or psychological problem that is likely related to alcohol use. Physical problems related to alcohol use can involve the brain, heart, liver, stomach, and intestines. Psychological problems related to alcohol use include intoxication, depression, anxiety, psychosis, delirium, and dementia.   °· The need for  increased amounts of alcohol to achieve the same desired effect, or a decreased effect from the consumption of the same amount of alcohol (tolerance). °· Withdrawal symptoms upon reducing or stopping alcohol use, or alcohol use to reduce or avoid withdrawal symptoms. Withdrawal symptoms include: °¨ Racing heart. °¨ Hand tremor. °¨ Difficulty sleeping. °¨ Nausea. °¨ Vomiting. °¨ Hallucinations. °¨ Restlessness. °¨ Seizures. °DIAGNOSIS °Alcohol use disorder is diagnosed through an assessment by your health care provider. Your health care provider may start by asking three or four questions to screen for excessive or problematic alcohol use. To confirm a diagnosis of alcohol use disorder, at least two symptoms must be present within a 12-month period. The severity of alcohol use disorder depends on the number of symptoms: °· Mild--two or three. °· Moderate--four or five. °· Severe--six or more. °Your health care provider may perform a physical exam or use results from lab tests to see if you have physical problems resulting from alcohol use. Your health care provider may refer you to a mental health professional for evaluation. °TREATMENT  °Some people with alcohol use disorder are able to reduce their alcohol use to low-risk levels. Some people with alcohol use disorder need to quit drinking alcohol. When necessary, mental health professionals with specialized training in substance use treatment can help. Your health care provider can help you decide how severe your alcohol use disorder is and what type of treatment you need. The following forms of treatment are available:  °· Detoxification. Detoxification involves the use of prescription medicines to prevent alcohol withdrawal symptoms in the first week after quitting. This is important for people with a history of symptoms   of withdrawal and for heavy drinkers who are likely to have withdrawal symptoms. Alcohol withdrawal can be dangerous and, in severe cases, cause  death. Detoxification is usually provided in a hospital or in-patient substance use treatment facility. °· Counseling or talk therapy. Talk therapy is provided by substance use treatment counselors. It addresses the reasons people use alcohol and ways to keep them from drinking again. The goals of talk therapy are to help people with alcohol use disorder find healthy activities and ways to cope with life stress, to identify and avoid triggers for alcohol use, and to handle cravings, which can cause relapse. °· Medicines. Different medicines can help treat alcohol use disorder through the following actions: °¨ Decrease alcohol cravings. °¨ Decrease the positive reward response felt from alcohol use. °¨ Produce an uncomfortable physical reaction when alcohol is used (aversion therapy). °· Support groups. Support groups are run by people who have quit drinking. They provide emotional support, advice, and guidance. °These forms of treatment are often combined. Some people with alcohol use disorder benefit from intensive combination treatment provided by specialized substance use treatment centers. Both inpatient and outpatient treatment programs are available. °Document Released: 04/15/2004 Document Revised: 07/23/2013 Document Reviewed: 06/15/2012 °ExitCare® Patient Information ©2015 [employer]. This information is not intended to replace advice given to you by your health care provider. Make sure you discuss any questions you have with your health care provider. ° ° ° ° °Emergency Department Resource Guide °1) Find a Doctor and Pay Out of Pocket °Although you won't have to find out who is covered by your insurance plan, it is a good idea to ask around and get recommendations. You will then need to call the office and see if the doctor you have chosen will accept you as a new patient and what types of options they offer for patients who are self-pay. Some doctors offer discounts or will set up payment plans for  their patients who do not have insurance, but you will need to ask so you aren't surprised when you get to your appointment. ° °2) Contact Your Local Health Department °Not all health departments have doctors that can see patients for sick visits, but many do, so it is worth a call to see if yours does. If you don't know where your local health department is, you can check in your phone book. The CDC also has a tool to help you locate your state's health department, and many state websites also have listings of all of their local health departments. ° °3) Find a Walk-in Clinic °If your illness is not likely to be very severe or complicated, you may want to try a walk in clinic. These are popping up all over the country in pharmacies, drugstores, and shopping centers. They're usually staffed by nurse practitioners or physician assistants that have been trained to treat common illnesses and complaints. They're usually fairly quick and inexpensive. However, if you have serious medical issues or chronic medical problems, these are probably not your best option. ° °No Primary Care Doctor: °- Call [facility] at  832-8000 - they can help you locate a primary care doctor that  accepts your insurance, provides certain services, etc. °- Physician Referral Service- 1-800-533-3463 ° °Chronic Pain Problems: °Organization         Address  Phone   Notes  ° [facility]  (336) 297-2271 Patients need to be referred by their primary care doctor.  ° °Medication Assistance: °Organization           Address  Phone   Notes  °[place] [facility] [address] °[place], [place] [address] (336) 641-8030 --Must be a resident of [place] °-- Must have NO insurance coverage whatsoever (no Medicaid/ Medicare, etc.) °-- The pt. MUST have a primary care doctor that directs their care regularly and follows them in the community °  °[facility]  (866) 331-1348   °[employer]  (888)  892-1162   ° °Agencies that provide inexpensive medical care: °Organization         Address  Phone   Notes  °[facility]  (336) 832-8035   °[facility]    (336) 832-7272   °[facility] [address] °[place], [place] [address] (336) 832-4777   °[facility] [address], °[place] (336) 271-4999   °[facility]    (336) 373-0678   °[facility]    (336) 272-1050   °[facility] ° [address], [place] Phone:  (336) 832-4444, Fax:  (336) 832-4440 Hours of Operation:  9 am - 6 pm, M-F.  Also accepts Medicaid/Medicare and self-pay.  °[facility] ° [address], [place] Phone: (336) 832-3150, Fax: (336) 832-3151. Hours of Operation:  8:30 am - 5:30 pm, M-F.  Also accepts Medicaid and self-pay.  °[facility] [place] [address], [place] Phone: (336) 878-6027   °[facility] [address], [place], [place] (336)723-1848, Ext. 123 Mondays & Thursdays: 7-9 AM.  First 15 patients are seen on a first come, first serve basis. °  ° °Medicaid-accepting [place] Providers: ° °Organization         Address  Phone   Notes  °[facility] [address], [place] (336) 641-2100 Also accepts self-pay patients.  °[facility] [address], [place] ° (336) 856-9996   °[facility] [address], [place] (336) 288-8857   °[facility] [address] [place] [address], [place] (336) 299-7000   °Veita Bland [address], [place]  ° (336) 373-1557 Only accepts [place] Access Medicaid patients after they have their name applied to their card.  ° °Self-Pay (no insurance) in [place]: ° °Organization         Address  Phone   Notes  °Sickle Cell Patients, [facility] [address], [place] (336)  832-1970   °[facility] [address], [place] (336) 832-4400   °[facility] [place] ° [address], [place] (336) 992-4800   °[facility]/Dr. Osei-Bonsu ° [address] [place] [address], [place] or [address], [place] (336) 841-8500 Phone number for both [place] and [place] locations is the same.  °[facility] [address], [place] (336) 299-0000   °[facility] [address] [place] [address], [place] or [address] (336) 852-7530 °(336) 878-2260   °[facility] [address],  (336) 350-1642, phone; (336) 294-5005, fax Sees patients 1st and 3rd Saturday of every month.  Must not qualify for public or private insurance (i.e. Medicaid, Medicare, [place] Health Choice, Veterans' Benefits) • Household income should be no more than 200% of the poverty level •The clinic cannot treat you if you are pregnant or think you   are pregnant • Sexually transmitted diseases are not treated at the clinic.  ° ° °Dental Care: °Organization         Address  Phone  Notes  °[facility] [address], [place] (336) 641-6152 Accepts children up to age 21 who are enrolled in Medicaid or [place] Health Choice; pregnant women with a Medicaid card; and children who have applied for Medicaid or [place] Health Choice, but were declined, whose parents can pay a reduced fee at time of service.  °[facility] [place]  [address], [place] (336) 641-7733 Accepts children up to age 21 who are enrolled in Medicaid or [place] Health Choice; pregnant women with a Medicaid card; and children who have applied for Medicaid or [place] Health Choice, but were declined, whose parents can pay a reduced fee at time of service.  °[facility] ° [address], [place] (336) 641-4533 Patients are  seen by appointment only. Walk-ins are not accepted. [facility] will see patients 18 years of age and older. °Monday - Tuesday (8am-5pm) °Most Wednesdays (8:30-5pm) °$30 per visit, cash only  °[facility] ° [address], [place] (336) 641-4533 Patients are seen by appointment only. Walk-ins are not accepted. [facility] will see patients 18 years of age and older. °One Wednesday Evening (Monthly: Volunteer Based).  $30 per visit, cash only  °[facility]  (919) 537-3737 for adults; Children under age 4, call Graduate Pediatric Dentistry at (919) [phone]. Children aged 4-14, please call (919) [phone] to request a pediatric application. ° Dental services are provided in all areas of dental care including fillings, crowns and bridges, complete and partial dentures, implants, gum treatment, root canals, and extractions. Preventive care is also provided. Treatment is provided to both adults and children. °Patients are selected via a lottery and there is often a waiting list. °  °[facility] [address], °[place] ° (336) 763-8833 [email or web address] °  °[facility] [address], [place], [place] (336)723-1848, Ext. 123 Second and Fourth Thursday of each month, opens at 6:30 AM; Clinic ends at 9 AM.  Patients are seen on a first-come first-served basis, and a limited number are seen during each clinic.  ° °[facility] ° [address], [place], [place] (336) 723-7904   Eligibility Requirements °You must have lived in [place], or [place] counties for at least the last three months. °  You cannot be eligible for state or federal sponsored healthcare insurance, including Veterans Administration, Medicaid, or Medicare. °  You generally cannot be eligible for healthcare insurance through your employer.  °  How to apply: °Eligibility screenings are held every Tuesday and Wednesday afternoon from 1:00 pm until 4:00  pm. You do not need an appointment for the interview!  °[facility] [address], [place], [place] 336-631-2330   °[facility]  336-342-8273   °[facility]  336-703-3100   °[facility]  336-570-6415   ° °Behavioral Health Resources in the Community: °Intensive Outpatient Programs °Organization         Address  Phone  Notes  °[facility] [address], [place],  336-878-6098   °[facility] [address], ,  336-832-9800   °[facility] [address], [place], [place] ° 336-882-2125   °[facility] [address],  °[place], [place] 1-800-853-5163 or 336-641-4981   °Substance Abuse Resources °Organization         Address  Phone  Notes  °[facility]  336-882-2125   °[facility]  336-784-9470   °[facility]  336-285-9073   °[facility]  336-845-3988   °Residential & Outpatient Substance Abuse Program  1-800-659-3381   °Psychological Services °Organization         Address  Phone  Notes  °[facility]  336- 832-9600   °[facility]  336- 378-7881   °[facility] [address], [place] 1-800-853-5163 or 336-641-4981   ° °Mobile Crisis Teams °Organization         Address  Phone  Notes  °[facility], Mobile Crisis Care Unit  1-877-626-1772   °Assertive °[facility] ° [address] [place], [place] 336-834-9664   °Sharon DeEsch [address] °[place] 336-554-5454   ° °Self-Help/Support Groups °Organization         Address  Phone             Notes  °[facility] - variety of support groups  336- 373-1402 Call for more information  °Narcotics Anonymous (NA), [facility] [address], °[place]  2 meetings at this location  ° °Residential Treatment Programs °Organization          Address  Phone  Notes  °[facility] [address],    °[place]  1-866-801-8205   °[facility] ° [address], [place], [place] 704-293-8524   °[facility] [address], [place] 336-845-3988 Admissions: 8am-3pm M-F  °[facility] [address]    °[place], [place] 336-841-1104   °[facility] [address], [place], [place] 336-379-7146   °[facility] [address]  °[place], [place] 336-285-9073   °[facility] - Intensive Outpatient [address], [place], [place] 336-852-3033   °[facility] [address]  °[place], [place] 1-877-615-2722 or 336-784-9470   °[facility] [address] [place], [place] 336-227-7417 Accepts Medicaid  °[facility] [address]  ° [place] 1-800-659-3381 Substance Abuse/Addiction Treatment  ° °[place] Behavioral Health Resources °Organization         Address  Phone  Notes  °[facility]  (888) 581-9988   °Julie Brannon, PhD [address] [place], [place]   (336) 349-5553 or (336) 951-0000   °[facility]   [address] °[place], [place] (336) 349-4454   °[facility] [address], [place], [place] (336) 342-8316 Insurance/Medicaid/sponsorship through [facility]  °[facility] [address]                                    [place], [place] (336) 342-8316 Therapy/tele-psych/case  °[facility] [address]  ° [place], [place] (336) 349-2233    °Dr. Arfeen  (336) 349-4544   °[facility] [place]  [employer] [place] [facility] 1) [address],  °2) [address], [place] °3)  [address], [place] (336) 349-3220 °(336) 342-7768 ° °(336) 342-8140   °[place] [facility] (336)   342-1394 or (336) 342-3537 (After Hours)    ° ° ° °
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
## 2014-10-28 NOTE — ED Notes (Signed)
Pt presents via EMS with c/o alcohol intoxication. Pt was just seen at [facility] and when he left he went to obtain some more alcohol and is now too drunk too stand. Pt is answering questions for EMS but is very clearly intoxicated.
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
## 2014-10-28 NOTE — ED Notes (Addendum)
Patient denies SI/HI, visual or auditory hallucinations. Patient states he has been drinking x 4 days. Patient states he drank 40 ounces today prior to coming to the ED.

## 2014-10-28 NOTE — ED Notes (Signed)
Per EMS-here for alcohol intoxication. Arousable to verbal stimuli. VSS: CBG 141 mg/dl, 161/09, HR 84 RR 16. No other c/c.

## 2014-10-28 NOTE — ED Notes (Signed)
Bed: [facility]3 Expected date:  Expected time:  Means of arrival:  Comments: EMS - Albuterol tx

## 2014-10-30 ENCOUNTER — Encounter (HOSPITAL_COMMUNITY): Payer: Self-pay | Admitting: Emergency Medicine

## 2014-10-30 ENCOUNTER — Emergency Department (HOSPITAL_COMMUNITY)
Admission: EM | Admit: 2014-10-30 | Discharge: 2014-10-30 | Disposition: A | Discharge status: Home / Self Care | Payer: Medicaid Other | Attending: Emergency Medicine | Admitting: Emergency Medicine

## 2014-10-30 ENCOUNTER — Emergency Department (HOSPITAL_COMMUNITY): Payer: Medicaid Other

## 2014-10-30 DIAGNOSIS — S50312A Abrasion of left elbow, initial encounter: Secondary | ICD-10-CM

## 2014-10-30 DIAGNOSIS — Y9289 Other specified places as the place of occurrence of the external cause: Secondary | ICD-10-CM | POA: Diagnosis not present

## 2014-10-30 DIAGNOSIS — W1839XA Other fall on same level, initial encounter: Secondary | ICD-10-CM | POA: Diagnosis not present

## 2014-10-30 DIAGNOSIS — S5002XA Contusion of left elbow, initial encounter: Secondary | ICD-10-CM

## 2014-10-30 DIAGNOSIS — W19XXXA Unspecified fall, initial encounter: Secondary | ICD-10-CM

## 2014-10-30 DIAGNOSIS — Z79899 Other long term (current) drug therapy: Secondary | ICD-10-CM | POA: Diagnosis not present

## 2014-10-30 DIAGNOSIS — Y998 Other external cause status: Secondary | ICD-10-CM | POA: Diagnosis not present

## 2014-10-30 DIAGNOSIS — F10120 Alcohol abuse with intoxication, uncomplicated: Secondary | ICD-10-CM | POA: Insufficient documentation

## 2014-10-30 DIAGNOSIS — F319 Bipolar disorder, unspecified: Secondary | ICD-10-CM | POA: Insufficient documentation

## 2014-10-30 DIAGNOSIS — Y9389 Activity, other specified: Secondary | ICD-10-CM | POA: Diagnosis not present

## 2014-10-30 DIAGNOSIS — F10920 Alcohol use, unspecified with intoxication, uncomplicated: Secondary | ICD-10-CM

## 2014-10-30 NOTE — ED Notes (Signed)
Bed: [facility] Expected date:  Expected time:  Means of arrival:  Comments: EMS intoxicated 

## 2014-10-30 NOTE — ED Notes (Signed)
Pt presents via EMS for ETOH and fall. Denies LOC, abrasion to left albow, dressing in place.
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
## 2014-11-30 ENCOUNTER — Encounter (HOSPITAL_COMMUNITY): Payer: Self-pay | Admitting: Emergency Medicine

## 2014-11-30 ENCOUNTER — Emergency Department (HOSPITAL_COMMUNITY)
Admission: EM | Admit: 2014-11-30 | Discharge: 2014-11-30 | Disposition: A | Discharge status: Home / Self Care | Payer: Medicaid Other | Attending: Emergency Medicine | Admitting: Emergency Medicine

## 2014-11-30 DIAGNOSIS — F10129 Alcohol abuse with intoxication, unspecified: Secondary | ICD-10-CM | POA: Diagnosis present

## 2014-11-30 DIAGNOSIS — F319 Bipolar disorder, unspecified: Secondary | ICD-10-CM | POA: Diagnosis not present

## 2014-11-30 DIAGNOSIS — Z8719 Personal history of other diseases of the digestive system: Secondary | ICD-10-CM | POA: Insufficient documentation

## 2014-11-30 DIAGNOSIS — F10120 Alcohol abuse with intoxication, uncomplicated: Secondary | ICD-10-CM | POA: Insufficient documentation

## 2014-11-30 DIAGNOSIS — R109 Unspecified abdominal pain: Secondary | ICD-10-CM | POA: Diagnosis not present

## 2014-11-30 DIAGNOSIS — Z79899 Other long term (current) drug therapy: Secondary | ICD-10-CM | POA: Diagnosis not present

## 2014-11-30 DIAGNOSIS — F10920 Alcohol use, unspecified with intoxication, uncomplicated: Secondary | ICD-10-CM

## 2014-11-30 NOTE — Discharge Instructions (Signed)
Alcohol Intoxication °Alcohol intoxication occurs when you drink enough alcohol that it affects your ability to function. It can be mild or very severe. Drinking a lot of alcohol in a short time is called binge drinking. This can be very harmful. Drinking alcohol can also be more dangerous if you are taking medicines or other drugs. Some of the effects caused by alcohol may include: °· Loss of coordination. °· Changes in mood and behavior. °· Unclear thinking. °· Trouble talking (slurred speech). °· Throwing up (vomiting). °· Confusion. °· Slowed breathing. °· Twitching and shaking (seizures). °· Loss of consciousness. °HOME CARE °· Do not drive after drinking alcohol. °· Drink enough water and fluids to keep your pee (urine) clear or pale yellow. Avoid caffeine. °· Only take medicine as told by your doctor. °GET HELP IF: °· You throw up (vomit) many times. °· You do not feel better after a few days. °· You frequently have alcohol intoxication. Your doctor can help decide if you should see a substance use treatment counselor. °GET HELP RIGHT AWAY IF: °· You become shaky when you stop drinking. °· You have twitching and shaking. °· You throw up blood. It may look bright red or like coffee grounds. °· You notice blood in your poop (bowel movements). °· You become lightheaded or pass out (faint). °MAKE SURE YOU:  °· Understand these instructions. °· Will watch your condition. °· Will get help right away if you are not doing well or get worse. °Document Released: 08/25/2007 Document Revised: 11/08/2012 Document Reviewed: 08/11/2012 °ExitCare® Patient Information ©2015 [employer]. This information is not intended to replace advice given to you by your health care provider. Make sure you discuss any questions you have with your health care provider. ° °

## 2014-11-30 NOTE — ED Provider Notes (Signed)
CSN: [id number]     Arrival date & time 11/30/14  1449 History   First MD Initiated Contact with Patient 11/30/14 1705     Chief Complaint  Patient presents with  . Alcohol Intoxication     (Consider location/radiation/quality/duration/timing/severity/associated sxs/prior Treatment) HPI  Craig [name] is a 54 y.o. male with PMH significant for chronic neck and back pain, bipolar disorder, and alcohol abuse who presents with alcohol intoxication. Per EMS, [place] park and brought to the emergency department. Patient states he is currently in a program here in [place] for his alcohol abuse, and he needs to get back by curfew, which is 10 PM. He states he has been dealing with alcohol abuse for the past 40 years. When asked about his alcohol ingestion today he gives a very circuitous answer and states he will drink anything. He does not have any medical complaints at this time.   Past Medical History  Diagnosis Date  . Back pain   . Neck pain   . Bipolar 1 disorder   . ETOH abuse   . Withdrawal seizures    History reviewed. No pertinent past surgical history. Family History  Problem Relation Age of Onset  . Hypertension Mother    Social History  Substance Use Topics  . Smoking status: Never Smoker   . Smokeless tobacco: Never Used  . Alcohol Use: Yes     Comment: ETOH abuse admits to drinking since 0800 this morning large quantities    Review of Systems All other systems negative unless otherwise stated in HPI    Allergies  Thorazine ; Haldol; Hydrocodone; and Lithium  Home Medications   Prior to Admission medications   Medication Sig Start Date End Date Taking? Authorizing Provider  clonazePAM (KLONOPIN) 1 MG tablet Take 1 mg by mouth 3 (three) times daily.    Historical Provider, MD  Multiple Vitamin (MULTIVITAMIN WITH MINERALS) TABS tablet Take 1 tablet by mouth daily.    Historical Provider, MD  pantoprazole (PROTONIX) 40 MG tablet Take 40 mg by  mouth daily.    Historical Provider, MD  QUEtiapine (SEROQUEL) 100 MG tablet Take 200 mg by mouth at bedtime.    Historical Provider, MD  traZODone (DESYREL) 100 MG tablet Take 100 mg by mouth at bedtime.    Historical Provider, MD   BP 116/63 mmHg  Pulse 105  Temp(Src) 98.4 F (36.9 C) (Oral)  Resp 18  SpO2 93% Physical Exam  Constitutional: He is oriented to person, place, and time. He appears well-developed and well-nourished.  HENT:  Head: Atraumatic.  Eyes: Conjunctivae are normal. No scleral icterus.  Neck: No tracheal deviation present.  Cardiovascular: Normal rate, regular rhythm and normal heart sounds.   Pulmonary/Chest: Effort normal and breath sounds normal. No respiratory distress.  Abdominal: Soft. Bowel sounds are normal. He exhibits no distension. There is tenderness.  Neurological: He is alert and oriented to person, place, and time.  Skin: Skin is warm and dry.  Psychiatric: He has a normal mood and affect. His behavior is normal. Thought content normal.  He is mentating appropriately.  Able to make decisions and answer questions.  Thinking logically.    ED Course  Procedures (including critical care time) Labs Review Labs Reviewed - No data to display  Imaging Review No results found. I have personally reviewed and evaluated these images and lab results as part of my medical decision-making.   EKG Interpretation None      MDM  Final diagnoses:  None    Patient presents with alcohol intoxication.  VSS, patient appears nontoxic, NAD.  On exam, mild tenderness. No imaging indicated at this time.  No labs indicated at this time. Low suspicion for ethanol poisoning.  Suspect acute alcohol intoxication.  Upon reevaluation, pt still displayed mild abdominal tenderness.  Discussed abdominal workup concerning abdominal tenderness, but patient declined.  He does not complain of abdominal pain. He is mentating appropriately and able to make sound decisions.  He  is able to ambulate without difficulty.  Pt stable for d/c.  Advised to follow up with [facility].  Discussed return precautions and supportive care.  Patient acknowledges and agrees with the above plan.     Cheri Fowler, PA-C 11/30/14 1753  Laurence Spates, MD 11/30/14 [phone]

## 2014-11-30 NOTE — ED Notes (Signed)
Per EMS: pt ETOH on board, found in park

## 2014-12-02 ENCOUNTER — Encounter (HOSPITAL_COMMUNITY): Payer: Self-pay | Admitting: Family Medicine

## 2014-12-02 ENCOUNTER — Emergency Department (HOSPITAL_COMMUNITY): Payer: Medicaid Other

## 2014-12-02 ENCOUNTER — Emergency Department (HOSPITAL_COMMUNITY)
Admission: EM | Admit: 2014-12-02 | Discharge: 2014-12-02 | Disposition: A | Discharge status: Home / Self Care | Payer: Medicaid Other | Attending: Emergency Medicine | Admitting: Emergency Medicine

## 2014-12-02 DIAGNOSIS — R1031 Right lower quadrant pain: Secondary | ICD-10-CM | POA: Diagnosis present

## 2014-12-02 DIAGNOSIS — R112 Nausea with vomiting, unspecified: Secondary | ICD-10-CM | POA: Insufficient documentation

## 2014-12-02 DIAGNOSIS — Z8719 Personal history of other diseases of the digestive system: Secondary | ICD-10-CM | POA: Diagnosis not present

## 2014-12-02 DIAGNOSIS — R1084 Generalized abdominal pain: Secondary | ICD-10-CM | POA: Insufficient documentation

## 2014-12-02 DIAGNOSIS — Z79899 Other long term (current) drug therapy: Secondary | ICD-10-CM | POA: Diagnosis not present

## 2014-12-02 DIAGNOSIS — F319 Bipolar disorder, unspecified: Secondary | ICD-10-CM | POA: Diagnosis not present

## 2014-12-02 HISTORY — DX: Acute pancreatitis without necrosis or infection, unspecified: K85.90

## 2014-12-02 LAB — URINALYSIS, ROUTINE W REFLEX MICROSCOPIC
Bilirubin Urine: NEGATIVE
Glucose, UA: NEGATIVE mg/dL
Hgb urine dipstick: NEGATIVE
KETONES UR: NEGATIVE mg/dL
LEUKOCYTES UA: NEGATIVE
NITRITE: NEGATIVE
Protein, ur: NEGATIVE mg/dL
SPECIFIC GRAVITY, URINE: 1.01 (ref 1.005–1.030)
Urobilinogen, UA: 0.2 mg/dL (ref 0.0–1.0)
pH: 5.5 (ref 5.0–8.0)

## 2014-12-02 LAB — COMPREHENSIVE METABOLIC PANEL
ALT: 136 U/L — ABNORMAL HIGH (ref 17–63)
AST: 391 U/L — AB (ref 15–41)
Albumin: 4.3 g/dL (ref 3.5–5.0)
Alkaline Phosphatase: 78 U/L (ref 38–126)
Anion gap: 13 (ref 5–15)
BUN: 13 mg/dL (ref 6–20)
CHLORIDE: 106 mmol/L (ref 101–111)
CO2: 20 mmol/L — ABNORMAL LOW (ref 22–32)
Calcium: 8.3 mg/dL — ABNORMAL LOW (ref 8.9–10.3)
Creatinine, Ser: 0.59 mg/dL — ABNORMAL LOW (ref 0.61–1.24)
GFR calc non Af Amer: >60 mL/min (ref >60)
Glucose, Bld: 124 mg/dL — ABNORMAL HIGH (ref 65–99)
POTASSIUM: 3.2 mmol/L — AB (ref 3.5–5.1)
Sodium: 139 mmol/L (ref 135–145)
Total Bilirubin: 0.7 mg/dL (ref 0.3–1.2)
Total Protein: 7.1 g/dL (ref 6.5–8.1)

## 2014-12-02 LAB — CBC
HEMATOCRIT: 37.9 % — AB (ref 39.0–52.0)
HEMOGLOBIN: 13.3 g/dL (ref 13.0–17.0)
MCH: 31.7 pg (ref 26.0–34.0)
MCHC: 35.1 g/dL (ref 30.0–36.0)
MCV: 90.2 fL (ref 78.0–100.0)
Platelets: 224 10*3/uL (ref 150–400)
RBC: 4.2 MIL/uL — AB (ref 4.22–5.81)
RDW: 14.1 % (ref 11.5–15.5)
WBC: 7.9 10*3/uL (ref 4.0–10.5)

## 2014-12-02 LAB — LIPASE, BLOOD: LIPASE: 21 U/L — AB (ref 22–51)

## 2014-12-02 MED ORDER — TRAMADOL HCL 50 MG PO TABS: 50.0000 mg | ORAL_TABLET | Freq: Four times a day (QID) | ORAL | Status: DC | PRN

## 2014-12-02 MED ORDER — SODIUM CHLORIDE 0.9 % IV BOLUS (SEPSIS): 1000.0000 mL | Freq: Once | INTRAVENOUS | Status: DC

## 2014-12-02 MED ORDER — MORPHINE SULFATE (PF) 4 MG/ML IV SOLN
6.0000 mg | Freq: Once | INTRAVENOUS | Status: AC
  Administered 2014-12-02: 6 mg via INTRAVENOUS
  Filled 2014-12-02: qty 2

## 2014-12-02 MED ORDER — FENTANYL CITRATE (PF) 100 MCG/2ML IJ SOLN
50.0000 ug | Freq: Once | INTRAMUSCULAR | Status: AC
  Administered 2014-12-02: 50 ug via INTRAVENOUS
  Filled 2014-12-02: qty 2

## 2014-12-02 MED ORDER — SODIUM CHLORIDE 0.9 % IV BOLUS (SEPSIS)
1000.0000 mL | Freq: Once | INTRAVENOUS | Status: AC
  Administered 2014-12-02: 1000 mL via INTRAVENOUS

## 2014-12-02 MED ORDER — IOHEXOL 300 MG/ML  SOLN
50.0000 mL | Freq: Once | INTRAMUSCULAR | Status: AC | PRN
  Administered 2014-12-02: 50 mL via ORAL

## 2014-12-02 MED ORDER — ONDANSETRON HCL 4 MG/2ML IJ SOLN
4.0000 mg | Freq: Once | INTRAMUSCULAR | Status: AC
  Administered 2014-12-02: 4 mg via INTRAVENOUS
  Filled 2014-12-02: qty 2

## 2014-12-02 MED ORDER — IOHEXOL 300 MG/ML  SOLN
100.0000 mL | Freq: Once | INTRAMUSCULAR | Status: AC | PRN
  Administered 2014-12-02: 100 mL via INTRAVENOUS

## 2014-12-02 NOTE — ED Provider Notes (Addendum)
CSN: [id number]     Arrival date & time 12/02/14  4098 History  This chart was scribed for Derwood Kaplan, MD by Ronney Lion, ED Scribe. This patient was seen in room WA16/WA16 and the patient's care was started at 3:18 AM.    Chief Complaint  Patient presents with  . Abdominal Pain   The history is provided by the patient. No language interpreter was used.    HPI Comments: Craig [name] is a 54 y.o. male with a history of pancreatitis, EtOH abuse, and withdrawal seizures, who presents to the Emergency Department complaining of worsening 6/10 RLQ abdominal pain radiating up towards his pancreatitis, with onset 2 days ago when patient was in class. Patient complains of associated nausea and 2 episodes of vomiting. Patient states this is an entirely new pain, adding this does not feel like his pancreatitis. He denies a history of any abdominal surgeries or cancer. Patient states he last drank alcohol yesterday, but he does not think his pain is related to drinking. Patient had taken ibuprofen 800 mg with only minimal relief.    Past Medical History  Diagnosis Date  . Back pain   . Neck pain   . Bipolar 1 disorder   . ETOH abuse   . Withdrawal seizures   . Pancreatitis    History reviewed. No pertinent past surgical history. Family History  Problem Relation Age of Onset  . Hypertension Mother    Social History  Substance Use Topics  . Smoking status: Never Smoker   . Smokeless tobacco: Never Used  . Alcohol Use: Yes     Comment: Last drink: Yesterday    Review of Systems  Gastrointestinal: Positive for nausea, vomiting and abdominal pain.  All other systems reviewed and are negative.   Allergies  Thorazine ; Haldol; Hydrocodone; and Lithium  Home Medications   Prior to Admission medications   Medication Sig Start Date End Date Taking? Authorizing Provider  clonazePAM (KLONOPIN) 1 MG tablet Take 1 mg by mouth 3 (three) times daily.   Yes Historical Provider, MD  Multiple  Vitamin (MULTIVITAMIN WITH MINERALS) TABS tablet Take 1 tablet by mouth daily.   Yes Historical Provider, MD  pantoprazole (PROTONIX) 40 MG tablet Take 40 mg by mouth daily.   Yes Historical Provider, MD  QUEtiapine (SEROQUEL) 100 MG tablet Take 200 mg by mouth at bedtime.   Yes Historical Provider, MD  traZODone (DESYREL) 100 MG tablet Take 100 mg by mouth at bedtime.   Yes Historical Provider, MD   BP 135/86 mmHg  Pulse 83  Resp 18  Ht 6' (1.829 m)  Wt 220 lb (99.791 kg)  BMI 29.83 kg/m2  SpO2 98% Physical Exam  Constitutional: He is oriented to person, place, and time. He appears well-developed and well-nourished. No distress.  HENT:  Head: Normocephalic and atraumatic.  Eyes: Conjunctivae and EOM are normal.  Neck: Neck supple. No tracheal deviation present.  Cardiovascular:  Heart rate is 96.  Pulmonary/Chest: Effort normal. No respiratory distress. He has no wheezes. He has no rales.  Lungs are clear to auscultation.   Abdominal: There is tenderness.  Positive bowel sounds. Patient has diffuse abdominal tenderness, worse in RUQ, RLQ, and LLQ.  Musculoskeletal: Normal range of motion.  Neurological: He is alert and oriented to person, place, and time.  Skin: Skin is warm and dry.  Psychiatric: He has a normal mood and affect. His behavior is normal.  Nursing note and vitals reviewed.   ED Course  Procedures (including critical care time)  DIAGNOSTIC STUDIES: Oxygen Saturation is 98% on RA, normal by my interpretation.    COORDINATION OF CARE: 3:22 AM - Lab results reviewed with pt. Discussed treatment plan with pt at bedside which includes CT abdomen . Pt verbalized understanding and agreed to plan.    Labs Review Labs Reviewed  LIPASE, BLOOD - Abnormal; Notable for the following:    Lipase 21 (*)    All other components within normal limits  COMPREHENSIVE METABOLIC PANEL - Abnormal; Notable for the following:    Potassium 3.2 (*)    CO2 20 (*)    Glucose, Bld  124 (*)    Creatinine, Ser 0.59 (*)    Calcium 8.3 (*)    AST 391 (*)    ALT 136 (*)    All other components within normal limits  CBC - Abnormal; Notable for the following:    RBC 4.20 (*)    HCT 37.9 (*)    All other components within normal limits  URINALYSIS, ROUTINE W REFLEX MICROSCOPIC (NOT AT [facility])    Imaging Review Ct Abdomen Pelvis W Contrast  12/02/2014   CLINICAL DATA:  Lower abdominal pain. Worse in the right lower quadrant for 2 days right upper quadrant, right lower quadrant, and left lower quadrant abdominal tenderness.  EXAM: CT ABDOMEN AND PELVIS WITH CONTRAST  TECHNIQUE: Multidetector CT imaging of the abdomen and pelvis was performed using the standard protocol following bolus administration of intravenous contrast.  CONTRAST:  OMNIPAQUE IOHEXOL 300 MG/ML  SOLN  COMPARISON:  Most recent CT 05/14/2014  FINDINGS: Lower chest: Scarring and atelectasis at the left lung base. Elevation of left hemidiaphragm.  Liver: Decreased density consistent with steatosis, decreased in degree from prior exam. No focal lesion.  Hepatobiliary: Gallbladder appears distended. No calcified cholelithiasis. No definite pericholecystic fluid. No biliary dilatation.  Pancreas: No ductal dilatation or surrounding inflammation.  Spleen: Normal.  Adrenal glands: No nodule.  Kidneys: Symmetric renal enhancement. No hydronephrosis. Multiple left renal cysts are unchanged from prior exam.  Stomach/Bowel: Stomach physiologically distended. There are no dilated or thickened small bowel loops. Small volume of stool throughout the colon without colonic wall thickening. The appendix is normal.  Vascular/Lymphatic: No retroperitoneal adenopathy. Abdominal aorta is normal in caliber.  Reproductive: Prostate gland is normal in size.  Bladder: Physiologically distended.  Other: Fat in the right inguinal canal. No free air, free fluid, or intra-abdominal fluid collection.  Musculoskeletal: There are no acute or  suspicious osseous abnormalities.  IMPRESSION: 1. Distended gallbladder. No CT findings of acute inflammation. Ultrasound could be considered if there is clinical concern for hepatobiliary pathology. 2. Hepatic steatosis.  Unchanged left renal cysts.   Electronically Signed   By: Rubye Oaks M.D.   On: 12/02/2014 06:08      MDM   Final diagnoses:  Generalized abdominal pain    I personally performed the services described in this documentation, which was scribed in my presence. The recorded information has been reviewed and is accurate.  Pt comes in with cc of abdominal pain. Pain is moderate, but on palpation it gets severe and is diffuse. Tenderness is worst over the right side. His LFTs are slightly elevated as well. Will get CT to understand the morphology better.  Derwood Kaplan, MD 12/02/14 0420   :20: CT shows distended gall bladder. Pt's tenderness is worst over the right side, including RUQ. Will get [place].    Derwood Kaplan, MD 12/02/14 [phone]

## 2014-12-02 NOTE — ED Notes (Signed)
US at bedside

## 2014-12-02 NOTE — ED Notes (Signed)
Pt resting with eyes closed, RR even and unlabored, no distress noted.

## 2014-12-02 NOTE — ED Notes (Signed)
Called pt from lobby to room, no response.

## 2014-12-02 NOTE — Discharge Instructions (Signed)

## 2014-12-02 NOTE — ED Notes (Signed)
Patient is experiencing right lower quad with nausea and vomiting. When the pain started, he was lying down.

## 2014-12-31 ENCOUNTER — Ambulatory Visit: Payer: Medicaid Other | Attending: Anesthesiology

## 2014-12-31 DIAGNOSIS — R6889 Other general symptoms and signs: Secondary | ICD-10-CM | POA: Diagnosis present

## 2014-12-31 DIAGNOSIS — M545 Low back pain, unspecified: Secondary | ICD-10-CM

## 2014-12-31 DIAGNOSIS — M2560 Stiffness of unspecified joint, not elsewhere classified: Secondary | ICD-10-CM | POA: Insufficient documentation

## 2014-12-31 DIAGNOSIS — R293 Abnormal posture: Secondary | ICD-10-CM | POA: Diagnosis present

## 2014-12-31 NOTE — Patient Instructions (Signed)
From cabinet  Issued BackBasic exercises and posture and body mechanics . Stretching 10-30 sec RT and LT 2x/day 2-3 reps and strength 10-20 reps hold 2-5 sec 1-2x/day
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
## 2015-01-07 ENCOUNTER — Encounter (HOSPITAL_COMMUNITY): Payer: Self-pay | Admitting: Emergency Medicine

## 2015-01-07 ENCOUNTER — Emergency Department (HOSPITAL_COMMUNITY)
Admission: EM | Admit: 2015-01-07 | Discharge: 2015-01-07 | Disposition: A | Discharge status: Home / Self Care | Payer: Medicaid Other | Attending: Emergency Medicine | Admitting: Emergency Medicine

## 2015-01-07 ENCOUNTER — Emergency Department (HOSPITAL_COMMUNITY)
Admission: EM | Admit: 2015-01-07 | Discharge: 2015-01-08 | Disposition: A | Discharge status: Home / Self Care | Payer: Medicaid Other | Source: Home / Self Care | Attending: Emergency Medicine | Admitting: Emergency Medicine

## 2015-01-07 DIAGNOSIS — Z8719 Personal history of other diseases of the digestive system: Secondary | ICD-10-CM | POA: Insufficient documentation

## 2015-01-07 DIAGNOSIS — F10929 Alcohol use, unspecified with intoxication, unspecified: Secondary | ICD-10-CM

## 2015-01-07 DIAGNOSIS — Z79899 Other long term (current) drug therapy: Secondary | ICD-10-CM | POA: Diagnosis not present

## 2015-01-07 DIAGNOSIS — F319 Bipolar disorder, unspecified: Secondary | ICD-10-CM | POA: Diagnosis not present

## 2015-01-07 DIAGNOSIS — F10120 Alcohol abuse with intoxication, uncomplicated: Secondary | ICD-10-CM | POA: Insufficient documentation

## 2015-01-07 DIAGNOSIS — F10920 Alcohol use, unspecified with intoxication, uncomplicated: Secondary | ICD-10-CM

## 2015-01-07 DIAGNOSIS — F10129 Alcohol abuse with intoxication, unspecified: Secondary | ICD-10-CM | POA: Diagnosis present

## 2015-01-07 LAB — CBC WITH DIFFERENTIAL/PLATELET
BASOS PCT: 0 %
Basophils Absolute: 0 10*3/uL (ref 0.0–0.1)
EOS ABS: 0 10*3/uL (ref 0.0–0.7)
Eosinophils Relative: 0 %
HCT: 41.8 % (ref 39.0–52.0)
HEMOGLOBIN: 14.3 g/dL (ref 13.0–17.0)
LYMPHS ABS: 2.1 10*3/uL (ref 0.7–4.0)
Lymphocytes Relative: 19 %
MCH: 31.4 pg (ref 26.0–34.0)
MCHC: 34.2 g/dL (ref 30.0–36.0)
MCV: 91.9 fL (ref 78.0–100.0)
MONO ABS: 0.8 10*3/uL (ref 0.1–1.0)
MONOS PCT: 8 %
NEUTROS PCT: 73 %
Neutro Abs: 8 10*3/uL — ABNORMAL HIGH (ref 1.7–7.7)
Platelets: 234 10*3/uL (ref 150–400)
RBC: 4.55 MIL/uL (ref 4.22–5.81)
RDW: 13.8 % (ref 11.5–15.5)
WBC: 10.9 10*3/uL — ABNORMAL HIGH (ref 4.0–10.5)

## 2015-01-07 LAB — COMPREHENSIVE METABOLIC PANEL
ALK PHOS: 101 U/L (ref 38–126)
ALT: 103 U/L — ABNORMAL HIGH (ref 17–63)
ANION GAP: 12 (ref 5–15)
AST: 301 U/L — ABNORMAL HIGH (ref 15–41)
Albumin: 4.2 g/dL (ref 3.5–5.0)
BILIRUBIN TOTAL: 0.9 mg/dL (ref 0.3–1.2)
BUN: 13 mg/dL (ref 6–20)
CALCIUM: 8.3 mg/dL — AB (ref 8.9–10.3)
CO2: 27 mmol/L (ref 22–32)
CREATININE: 0.8 mg/dL (ref 0.61–1.24)
Chloride: 106 mmol/L (ref 101–111)
GFR calc Af Amer: >60 mL/min (ref >60)
GFR calc non Af Amer: >60 mL/min (ref >60)
GLUCOSE: 116 mg/dL — AB (ref 65–99)
Potassium: 3.7 mmol/L (ref 3.5–5.1)
SODIUM: 145 mmol/L (ref 135–145)
TOTAL PROTEIN: 7.7 g/dL (ref 6.5–8.1)

## 2015-01-07 LAB — ETHANOL: Alcohol, Ethyl (B): 426 mg/dL (ref <5)

## 2015-01-07 MED ORDER — ONDANSETRON 4 MG PO TBDP
4.0000 mg | ORAL_TABLET | Freq: Once | ORAL | Status: AC
  Administered 2015-01-07: 4 mg via ORAL
  Filled 2015-01-07: qty 1

## 2015-01-07 MED ORDER — VITAMIN B-1 100 MG PO TABS
100.0000 mg | ORAL_TABLET | Freq: Every day | ORAL | Status: DC
  Administered 2015-01-07: 100 mg via ORAL
  Filled 2015-01-07: qty 1

## 2015-01-07 MED ORDER — ONDANSETRON HCL 4 MG/2ML IJ SOLN: 4.0000 mg | Freq: Once | INTRAMUSCULAR | Status: DC

## 2015-01-07 NOTE — ED Provider Notes (Signed)
Patient presents for 2nd time today for alcohol dependence. Appears intoxicated, altered presumed due to intoxication. Exam atraumatic.   Plan: re-evaluate, discharge with ambulatory  Re-evaluation 5:00 am: patient has been sleeping without event through the night. Easily awakened. He has ambulated to and from the bathroom without threat of fall. He is drinking water without nausea, vomiting. Speech is coherent. Plan to discharge home on [place] with resources for outpatient alcohol treatment.   Elpidio AnisShari Justine Dines, PA-C 01/08/15 0522  Jerelyn ScottMartha Linker, MD 01/10/15 [phone]

## 2015-01-07 NOTE — ED Provider Notes (Signed)
CSN: [id number]     Arrival date & time 01/07/15  2036 History   First MD Initiated Contact with Patient 01/07/15 2045     Chief Complaint  Patient presents with  . detox      (Consider location/radiation/quality/duration/timing/severity/associated sxs/prior Treatment) The history is provided by the patient. No language interpreter was used.  Mr. Craig [name] is a 54 y.o male with a history of bipolar disorder, alcohol abuse, pancreatitis who presents to the ED via EMS for alcohol intoxication and detox. He was discharged from the hospital at 10 AM today and has been drinking since. He states that he came here for the possibility that we may be able to help him but says that he thinks that he may die soon from drinking daily. He denies having a plan or being suicidal. He states he has vomited several times today. He states he drank beer, wine, and liquor since he left the hospital this morning. When asked if he was in any pain he states that he has Percocet that has been prescribed to him for a previously broken neck. He denies any fall or loss of consciousness. He is incoherent at times. He denies any recent illness, chest pain, abdominal pain.  Past Medical History  Diagnosis Date  . Back pain   . Neck pain   . Bipolar 1 disorder (HCC)   . ETOH abuse   . Withdrawal seizures (HCC)   . Pancreatitis    History reviewed. No pertinent past surgical history. Family History  Problem Relation Age of Onset  . Hypertension Mother    Social History  Substance Use Topics  . Smoking status: Never Smoker   . Smokeless tobacco: Never Used  . Alcohol Use: Yes     Comment: Last drink: Yesterday    Review of Systems  Psychiatric/Behavioral:       Alcohol intoxication.   All other systems reviewed and are negative.     Allergies  Haldol; Thorazine ; Hydrocodone; Lithium; and Seroquel  Home Medications   Prior to Admission medications   Medication Sig Start Date End Date Taking? Authorizing  Provider  cetirizine (ZYRTEC) 10 MG tablet Take 10 mg by mouth daily.    Historical Provider, MD  clonazePAM (KLONOPIN) 0.5 MG tablet Take 0.5 mg by mouth 3 (three) times daily.    Historical Provider, MD  HYDROcodone-acetaminophen (NORCO/VICODIN) 5-325 MG tablet Take 1 tablet by mouth daily.    Historical Provider, MD  ibuprofen (ADVIL,MOTRIN) 200 MG tablet Take 200 mg by mouth every 6 (six) hours as needed.    Historical Provider, MD  lisdexamfetamine (VYVANSE) 50 MG capsule Take 50 mg by mouth daily.    Historical Provider, MD  lurasidone (LATUDA) 80 MG TABS tablet Take 80 mg by mouth daily with breakfast.    Historical Provider, MD  Multiple Vitamin (MULTIVITAMIN WITH MINERALS) TABS tablet Take 1 tablet by mouth daily.    Historical Provider, MD  oxyCODONE-acetaminophen (PERCOCET/ROXICET) 5-325 MG tablet Take 1 tablet by mouth 3 (three) times daily as needed. 12/25/14   Historical Provider, MD  tamsulosin (FLOMAX) 0.4 MG CAPS capsule Take 0.4 mg by mouth daily. 12/16/14   Historical Provider, MD  traMADol (ULTRAM) 50 MG tablet Take 1 tablet (50 mg total) by mouth every 6 (six) hours as needed. Patient not taking: Reported on 12/31/2014 12/02/14   Raeford RazorStephen Kohut, MD  traZODone (DESYREL) 50 MG tablet Take 100 mg by mouth at bedtime. 12/15/14   Historical Provider, MD   BP 131/85  mmHg  Pulse 94  Temp(Src) 98.2 F (36.8 C) (Oral)  Resp 18  SpO2 91% Physical Exam  Constitutional: He is oriented to person, place, and time. He appears well-developed and well-nourished.  HENT:  Head: Normocephalic and atraumatic.  Eyes: Conjunctivae are normal.  Neck: Normal range of motion. Neck supple.  Cardiovascular: Normal rate, regular rhythm and normal heart sounds.   Pulmonary/Chest: Effort normal and breath sounds normal.  Abdominal: Soft. There is no tenderness.  Musculoskeletal: Normal range of motion.  Neurological: He is alert and oriented to person, place, and time.  History speech is slurred due  to alcohol intoxication. Neuro exam is limited by intoxication.  Skin: Skin is warm and dry.  Patient has no bruising or lacerations to the skin. He is able to move all extremities without difficulty.  Nursing note and vitals reviewed.   ED Course  Procedures (including critical care time) Labs Review Labs Reviewed - No data to display  Imaging Review No results found.   EKG Interpretation None      MDM   Final diagnoses:  Alcohol intoxication, uncomplicated (HCC)  Patient is alcoholic who presents for alcohol intoxication. He was discharged from the hospital at 10 AM this morning. He states that he drank wine, beer, and liquor since leaving. He also complained of several episodes of vomiting. CIWA orders were placed due to vomiting and concern for withdrawals.  He will be observed in the ED until he is able to ambulate.    At that time it will be determined by the discharging physician whether the patient will need librium or not. Patient was signed out to [employer], PA-C.       Catha Gosselin, PA-C 01/08/15 1558  Jerelyn Scott, MD 01/10/15 [phone]

## 2015-01-07 NOTE — ED Provider Notes (Signed)
CSN: [id number]     Arrival date & time 01/07/15  0902 History   First MD Initiated Contact with Patient 01/07/15 [phone]     Chief Complaint  Patient presents with  . Alcohol Intoxication     (Consider location/radiation/quality/duration/timing/severity/associated sxs/prior Treatment) HPI Comments: Patient is a 54 year old male with past medical history of bipolar disorder, alcohol abuse, pancreatitis who presents to the ED via EMS with alcohol intoxication. EMS reported the [place] EMS state patient admitted to police that he has been drinking alcohol and taking "prescription pills". EMS report patient was combative upon arrival and was swearing and spitting at EMS. EMS placed patient on 2 L [place] due to concerns of desaturation, O2 87% while patient was sleeping. Patient endorses drinking alcohol this morning and states he took his prescriptions as prescribed. Denies any pain or other complaints at this time. Denies SI/HI, or hallucinations.     Past Medical History  Diagnosis Date  . Back pain   . Neck pain   . Bipolar 1 disorder (HCC)   . ETOH abuse   . Withdrawal seizures (HCC)   . Pancreatitis    History reviewed. No pertinent past surgical history. Family History  Problem Relation Age of Onset  . Hypertension Mother    Social History  Substance Use Topics  . Smoking status: Never Smoker   . Smokeless tobacco: Never Used  . Alcohol Use: Yes     Comment: Last drink: Yesterday    Review of Systems  All other systems reviewed and are negative.     Allergies  Thorazine ; Haldol; Hydrocodone; and Lithium  Home Medications   Prior to Admission medications   Medication Sig Start Date End Date Taking? Authorizing Provider  clonazePAM (KLONOPIN) 1 MG tablet Take 1 mg by mouth 3 (three) times daily.    Historical Provider, MD  lurasidone (LATUDA) 40 MG TABS tablet Take 40 mg by mouth daily with breakfast.    Historical Provider,  MD  Multiple Vitamin (MULTIVITAMIN WITH MINERALS) TABS tablet Take 1 tablet by mouth daily.    Historical Provider, MD  pantoprazole (PROTONIX) 40 MG tablet Take 40 mg by mouth daily.    Historical Provider, MD  QUEtiapine (SEROQUEL) 100 MG tablet Take 200 mg by mouth at bedtime.    Historical Provider, MD  traMADol (ULTRAM) 50 MG tablet Take 1 tablet (50 mg total) by mouth every 6 (six) hours as needed. Patient not taking: Reported on 12/31/2014 12/02/14   Raeford Razor, MD  traZODone (DESYREL) 100 MG tablet Take 100 mg by mouth at bedtime.    Historical Provider, MD   BP 131/92 mmHg  Pulse 95  Temp(Src) 97.8 F (36.6 C) (Oral)  Resp 22  SpO2 92% Physical Exam  Constitutional: He is oriented to person, place, and time. He appears well-developed and well-nourished.  Pt appears intoxicated with slurred speech, smells of EtOH.  HENT:  Head: Normocephalic and atraumatic.  Mouth/Throat: Oropharynx is clear and moist. No oropharyngeal exudate.  Eyes: Conjunctivae and EOM are normal. Pupils are equal, round, and reactive to light. Right eye exhibits no discharge. Left eye exhibits no discharge. No scleral icterus.  Neck: Normal range of motion. Neck supple.  Cardiovascular: Normal rate, regular rhythm, normal heart sounds and intact distal pulses.   No murmur heard. Pulmonary/Chest: Effort normal and breath sounds normal. No respiratory distress. He has no wheezes. He has no rales. He exhibits no tenderness.  Abdominal:  Soft. Bowel sounds are normal. He exhibits no distension and no mass. There is no tenderness. There is no rebound and no guarding.  Musculoskeletal: Normal range of motion. He exhibits no edema or tenderness.  Lymphadenopathy:    He has no cervical adenopathy.  Neurological: He is alert and oriented to person, place, and time.  Skin: Skin is warm and dry.  Nursing note and vitals reviewed.   ED Course  Procedures (including critical care time) Labs Review Labs Reviewed   CBC WITH DIFFERENTIAL/PLATELET  COMPREHENSIVE METABOLIC PANEL  ETHANOL  URINALYSIS, ROUTINE W REFLEX MICROSCOPIC ([facility])  URINE RAPID DRUG SCREEN, HOSP PERFORMED    Imaging Review No results found. I have personally reviewed and evaluated these images and lab results as part of my medical decision-making.  Filed Vitals:   01/07/15 1514  BP: 121/82  Pulse: 95  Temp: 97 F (36.1 C)  Resp: 16     MDM   Final diagnoses:  Alcohol intoxication, with unspecified complication (HCC)    Patient presents with alcohol intoxication. Patient has extensive history of multiple visits to the ED for alcohol intoxication. He has no medical complaints at this time.VSS. Patient appears intoxicated with slurred speech, smells of alcohol. He is cooperative and follows commands. Exam unremarkable. Labs consistent with patient's baseline values. I do not feel that imaging is warranted at this time. On reevaluation patient's speech has improved, patient tolerating PO. Patient able to walk and use the restroom without assistance, normal gait. Plan to discharge patient.  Evaluation does not show pathology requring ongoing emergent intervention or admission. Pt is hemodynamically stable and mentating appropriately. Discussed findings/results and plan with patient/guardian, who agrees with plan. All questions answered. Return precautions discussed and outpatient follow up given.     Satira Sarkicole Elizabeth Harbour HeightsNadeau, [place] JerseyPA-C 01/07/15 1609  Arby BarretteMarcy Pfeiffer, MD 01/11/15 [phone]

## 2015-01-07 NOTE — ED Notes (Signed)
Pt is awake and ambulated with +1 assistance to restroom for safety.  He still appears intoxicated and smells of alcohol but is alert and responding appropriately to conversation.

## 2015-01-07 NOTE — ED Notes (Signed)
Pt brought in by ems for detox from alcohol  Pt was discharged from here at 10am this morning and has been drinking ever since

## 2015-01-07 NOTE — ED Notes (Signed)
Critical lab- alcohol 426

## 2015-01-07 NOTE — ED Notes (Signed)
Bed: WA09 Expected date:  Expected time:  Means of arrival:  Comments: [id number]-ems, ams,combative

## 2015-01-07 NOTE — Discharge Instructions (Signed)
Follow-up with your primary care provider in the next week. Return to the emergency department if symptoms worsen or new onset of abdominal pain, fever, nausea, vomiting, syncope.

## 2015-01-07 NOTE — ED Notes (Addendum)
Per EMS. [place]. Pt admitted to [employer] that he had been drinking etoh and taking "prescription pills." Pt combative upon EMS arrival. Would only swear and spit at EMS. Pt sleeping upon arrival with EMS. Pt arrived with 2L [place] by EMS due to concerns will desaturation to 87% with sleeping on RA.

## 2015-01-08 MED ORDER — CHLORDIAZEPOXIDE HCL 25 MG PO CAPS: ORAL_CAPSULE | ORAL | Status: DC

## 2015-01-08 NOTE — ED Notes (Signed)
Pt ambulated to the restroom and back without assistance.

## 2015-01-08 NOTE — ED Notes (Signed)
Patient seen walking out of facility with steady gait, unassisted. PA aware patient is leaving.

## 2015-01-08 NOTE — Discharge Instructions (Signed)
Alcohol Abuse and Nutrition Alcohol abuse is any pattern of alcohol consumption that harms your health, relationships, or work. Alcohol abuse can affect how your body breaks down and absorbs nutrients from food by causing your liver to work abnormally. Additionally, many people who abuse alcohol do not eat enough carbohydrates, protein, fat, vitamins, and minerals. This can cause poor nutrition (malnutrition) and a lack of nutrients (nutrient deficiencies), which can lead to further complications. Nutrients that are commonly lacking (deficient) among people who abuse alcohol include:  Vitamins.  Vitamin A. This is stored in your liver. It is important for your vision, metabolism, and ability to fight off infections (immunity).  B vitamins. These include vitamins such as folate, thiamin, and niacin. These are important in new cell growth and maintenance.  Vitamin C. This plays an important role in iron absorption, wound healing, and immunity.  Vitamin D. This is produced by your liver, but you can also get vitamin D from food. Vitamin D is necessary for your body to absorb and use calcium.  Minerals.  Calcium. This is important for your bones and your heart and blood vessel (cardiovascular) function.  Iron. This is important for blood, muscle, and nervous system functioning.  Magnesium. This plays an important role in muscle and nerve function, and it helps to control blood sugar and blood pressure.  Zinc. This is important for the normal function of your nervous system and digestive system (gastrointestinal tract). Nutrition is an essential component of therapy for alcohol abuse. Your health care provider or dietitian will work with you to design a plan that can help restore nutrients to your body and prevent potential complications. WHAT IS MY PLAN? Your dietitian may develop a specific diet plan that is based on your condition and any other complications you may have. A diet plan will  commonly include:  A balanced diet.  Grains: 6-8 oz per day.  Vegetables: 2-3 cups per day.  Fruits: 1-2 cups per day.  Meat and other protein: 5-6 oz per day.  Dairy: 2-3 cups per day.  Vitamin and mineral supplements. WHAT DO I NEED TO KNOW ABOUT ALCOHOL AND NUTRITION?  Consume foods that are high in antioxidants, such as grapes, berries, nuts, green tea, and dark green and orange vegetables. This can help to counteract some of the stress that is placed on your liver by consuming alcohol.  Avoid food and drinks that are high in fat and sugar. Foods such as sugared soft drinks, salty snack foods, and candy contain empty calories. This means that they lack important nutrients such as protein, fiber, and vitamins.  Eat frequent meals and snacks. Try to eat 5-6 small meals each day.  Eat a variety of fresh fruits and vegetables each day. This will help you get plenty of water, fiber, and vitamins in your diet.  Drink plenty of water and other clear fluids. Try to drink at least 48-64 oz (1.5-2 L) of water per day.  If you are a vegetarian, eat a variety of protein-rich foods. Pair whole grains with plant-based proteins at meals and snacks to obtain the greatest nutrient benefit from your food. For example, eat rice with beans, put peanut butter on whole-grain toast, or eat oatmeal with sunflower seeds.  Soak beans and whole grains overnight before cooking. This can help your body to absorb the nutrients more easily.  Include foods fortified with vitamins and minerals in your diet. Commonly fortified foods include milk, orange juice, cereal, and bread.  If you  are malnourished, your dietitian may recommend a high-protein, high-calorie diet. This may include:  2,000-3,000 calories (kilocalories) per day.  70-100 grams of protein per day.  Your health care provider may recommend a complete nutritional supplement beverage. This can help to restore calories, protein, and vitamins to  your body. Depending on your condition, you may be advised to consume this instead of or in addition to meals.  Limit your intake of caffeine. Replace drinks like coffee and black tea with decaffeinated coffee and herbal tea.  Eat a variety of foods that are high in omega fatty acids. These include fish, nuts and seeds, and soybeans. These foods may help your liver to recover and may also stabilize your mood.  Certain medicines may cause changes in your appetite, taste, and weight. Work with your health care provider and dietitian to make any adjustments to your medicines and diet plan.  Include other healthy lifestyle choices in your daily routine.  Be physically active.  Get enough sleep.  Spend time doing activities that you enjoy.  If you are unable to take in enough food and calories by mouth, your health care provider may recommend a feeding tube. This is a tube that passes through your nose and throat, directly into your stomach. Nutritional supplement beverages can be given to you through the feeding tube to help you get the nutrients you need.  Take vitamin or mineral supplements as recommended by your health care provider. WHAT FOODS CAN I EAT? Grains Enriched pasta. Enriched rice. Fortified whole-grain bread. Fortified whole-grain cereal. Barley. Brown rice. Quinoa. Erwin. Vegetables All fresh, frozen, and canned vegetables. Spinach. Kale. Artichoke. Carrots. Winter squash and pumpkin. Sweet potatoes. Broccoli. Cabbage. Cucumbers. Tomatoes. Sweet peppers. Green beans. Peas. Corn. Fruits All fresh and frozen fruits. Berries. Grapes. Mango. Papaya. Guava. Cherries. Apples. Bananas. Peaches. Plums. Pineapple. Watermelon. Cantaloupe. Oranges. Avocado. Meats and Other Protein Sources Beef liver. Lean beef. Pork. Fresh and canned chicken. Fresh fish. Oysters. Sardines. Canned tuna. Shrimp. Eggs with yolks. Nuts and seeds. Peanut butter. Beans and lentils. Soybeans.  Tofu. Dairy Whole, low-fat, and nonfat milk. Whole, low-fat, and nonfat yogurt. Cottage cheese. Sour cream. Hard and soft cheeses. Beverages Water. Herbal tea. Decaffeinated coffee. Decaffeinated green tea. 100% fruit juice. 100% vegetable juice. Instant breakfast shakes. Condiments Ketchup. Mayonnaise. Mustard. Salad dressing. Barbecue sauce. Sweets and Desserts Sugar-free ice cream. Sugar-free pudding. Sugar-free gelatin. Fats and Oils Butter. Vegetable oil, flaxseed oil, olive oil, and walnut oil. Other Complete nutrition shakes. Protein bars. Sugar-free gum. The items listed above may not be a complete list of recommended foods or beverages. Contact your dietitian for more options. WHAT FOODS ARE NOT RECOMMENDED? Grains Sugar-sweetened breakfast cereals. Flavored instant oatmeal. Fried breads. Vegetables Breaded or deep-fried vegetables. Fruits Dried fruit with added sugar. Candied fruit. Canned fruit in syrup. Meats and Other Protein Sources Breaded or deep-fried meats. Dairy Flavored milks. Fried cheese curds or fried cheese sticks. Beverages Alcohol. Sugar-sweetened soft drinks. Sugar-sweetened tea. Caffeinated coffee and tea. Condiments Sugar. Honey. Agave nectar. Molasses. Sweets and Desserts Chocolate. Cake. Cookies. Candy. Other Potato chips. Pretzels. Salted nuts. Candied nuts. The items listed above may not be a complete list of foods and beverages to avoid. Contact your dietitian for more information.   This information is not intended to replace advice given to you by your health care provider. Make sure you discuss any questions you have with your health care provider.   Document Released: 12/31/2004 Document Revised: 03/29/2014 Document Reviewed: 10/09/2013 Elsevier Interactive Patient  Education 2016 [employer]  Alcohol Use Disorder Alcohol use disorder is a mental disorder. It is not a one-time incident of heavy drinking. Alcohol use disorder is the  excessive and uncontrollable use of alcohol over time that leads to problems with functioning in one or more areas of daily living. People with this disorder risk harming themselves and others when they drink to excess. Alcohol use disorder also can cause other mental disorders, such as mood and anxiety disorders, and serious physical problems. People with alcohol use disorder often misuse other drugs.  Alcohol use disorder is common and widespread. Some people with this disorder drink alcohol to cope with or escape from negative life events. Others drink to relieve chronic pain or symptoms of mental illness. People with a family history of alcohol use disorder are at higher risk of losing control and using alcohol to excess.  Drinking too much alcohol can cause injury, accidents, and health problems. One drink can be too much when you are:  Working.  Pregnant or breastfeeding.  Taking medicines. Ask your doctor.  Driving or planning to drive. SYMPTOMS  Signs and symptoms of alcohol use disorder may include the following:   Consumption ofalcohol inlarger amounts or over a longer period of time than intended.  Multiple unsuccessful attempts to cutdown or control alcohol use.   A great deal of time spent obtaining alcohol, using alcohol, or recovering from the effects of alcohol (hangover).  A strong desire or urge to use alcohol (cravings).   Continued use of alcohol despite problems at work, school, or home because of alcohol use.   Continued use of alcohol despite problems in relationships because of alcohol use.  Continued use of alcohol in situations when it is physically hazardous, such as driving a car.  Continued use of alcohol despite awareness of a physical or psychological problem that is likely related to alcohol use. Physical problems related to alcohol use can involve the brain, heart, liver, stomach, and intestines. Psychological problems related to alcohol use include  intoxication, depression, anxiety, psychosis, delirium, and dementia.   The need for increased amounts of alcohol to achieve the same desired effect, or a decreased effect from the consumption of the same amount of alcohol (tolerance).  Withdrawal symptoms upon reducing or stopping alcohol use, or alcohol use to reduce or avoid withdrawal symptoms. Withdrawal symptoms include:  Racing heart.  Hand tremor.  Difficulty sleeping.  Nausea.  Vomiting.  Hallucinations.  Restlessness.  Seizures. DIAGNOSIS Alcohol use disorder is diagnosed through an assessment by your health care provider. Your health care provider may start by asking three or four questions to screen for excessive or problematic alcohol use. To confirm a diagnosis of alcohol use disorder, at least two symptoms must be present within a 676-month period. The severity of alcohol use disorder depends on the number of symptoms:  Mild--two or three.  Moderate--four or five.  Severe--six or more. Your health care provider may perform a physical exam or use results from lab tests to see if you have physical problems resulting from alcohol use. Your health care provider may refer you to a mental health professional for evaluation. TREATMENT  Some people with alcohol use disorder are able to reduce their alcohol use to low-risk levels. Some people with alcohol use disorder need to quit drinking alcohol. When necessary, mental health professionals with specialized training in substance use treatment can help. Your health care provider can help you decide how severe your alcohol use disorder is and  what type of treatment you need. The following forms of treatment are available:   Detoxification. Detoxification involves the use of prescription medicines to prevent alcohol withdrawal symptoms in the first week after quitting. This is important for people with a history of symptoms of withdrawal and for heavy drinkers who are likely to  have withdrawal symptoms. Alcohol withdrawal can be dangerous and, in severe cases, cause death. Detoxification is usually provided in a hospital or in-patient substance use treatment facility.  Counseling or talk therapy. Talk therapy is provided by substance use treatment counselors. It addresses the reasons people use alcohol and ways to keep them from drinking again. The goals of talk therapy are to help people with alcohol use disorder find healthy activities and ways to cope with life stress, to identify and avoid triggers for alcohol use, and to handle cravings, which can cause relapse.  Medicines.Different medicines can help treat alcohol use disorder through the following actions:  Decrease alcohol cravings.  Decrease the positive reward response felt from alcohol use.  Produce an uncomfortable physical reaction when alcohol is used (aversion therapy).  Support groups. Support groups are run by people who have quit drinking. They provide emotional support, advice, and guidance. These forms of treatment are often combined. Some people with alcohol use disorder benefit from intensive combination treatment provided by specialized substance use treatment centers. Both inpatient and outpatient treatment programs are available.   This information is not intended to replace advice given to you by your health care provider. Make sure you discuss any questions you have with your health care provider.   Document Released: 04/15/2004 Document Revised: 03/29/2014 Document Reviewed: 06/15/2012 Elsevier Interactive Patient Education 2016 [employer].  Alcohol Intoxication Alcohol intoxication occurs when the amount of alcohol that a person has consumed impairs his or her ability to mentally and physically function. Alcohol directly impairs the normal chemical activity of the brain. Drinking large amounts of alcohol can lead to changes in mental function and behavior, and it can cause many physical  effects that can be harmful.  Alcohol intoxication can range in severity from mild to very severe. Various factors can affect the level of intoxication that occurs, such as the person's age, gender, weight, frequency of alcohol consumption, and the presence of other medical conditions (such as diabetes, seizures, or heart conditions). Dangerous levels of alcohol intoxication may occur when people drink large amounts of alcohol in a short period (binge drinking). Alcohol can also be especially dangerous when combined with certain prescription medicines or "recreational" drugs. SIGNS AND SYMPTOMS Some common signs and symptoms of mild alcohol intoxication include:  Loss of coordination.  Changes in mood and behavior.  Impaired judgment.  Slurred speech. As alcohol intoxication progresses to more severe levels, other signs and symptoms will appear. These may include:  Vomiting.  Confusion and impaired memory.  Slowed breathing.  Seizures.  Loss of consciousness. DIAGNOSIS  Your health care provider will take a medical history and perform a physical exam. You will be asked about the amount and type of alcohol you have consumed. Blood tests will be done to measure the concentration of alcohol in your blood. In many places, your blood alcohol level must be lower than 80 mg/dL (1.61%) to legally drive. However, many dangerous effects of alcohol can occur at much lower levels.  TREATMENT  People with alcohol intoxication often do not require treatment. Most of the effects of alcohol intoxication are temporary, and they go away as the alcohol naturally leaves  the body. Your health care provider will monitor your condition until you are stable enough to go home. Fluids are sometimes given through an IV access tube to help prevent dehydration.  HOME CARE INSTRUCTIONS  Do not drive after drinking alcohol.  Stay hydrated. Drink enough water and fluids to keep your urine clear or pale yellow. Avoid  caffeine.   Only take over-the-counter or prescription medicines as directed by your health care provider.  SEEK MEDICAL CARE IF:   You have persistent vomiting.   You do not feel better after a few days.  You have frequent alcohol intoxication. Your health care provider can help determine if you should see a substance use treatment counselor. SEEK IMMEDIATE MEDICAL CARE IF:   You become shaky or tremble when you try to stop drinking.   You shake uncontrollably (seizure).   You throw up (vomit) blood. This may be bright red or may look like black coffee grounds.   You have blood in your stool. This may be bright red or may appear as a black, tarry, bad smelling stool.   You become lightheaded or faint.  MAKE SURE YOU:   Understand these instructions.  Will watch your condition.  Will get help right away if you are not doing well or get worse.   This information is not intended to replace advice given to you by your health care provider. Make sure you discuss any questions you have with your health care provider.   Document Released: 12/16/2004 Document Revised: 11/08/2012 Document Reviewed: 08/11/2012 Elsevier Interactive Patient Education 2016 [employer]. Substance Abuse Treatment Programs  Intensive Outpatient Programs [facility]     [address]      [place], [place]                   [phone]       [facility] [address] [place], [place] [phone]  [facility]     (Inpatient and outpatient)     [address]           [phone]    [facility] [phone] (Suboxone and Methadone)  [address]      [place], [place] [address]      [phone]       [address] [place], [place] [phone]  [facility] (Outpatient/Inpatient, [facility])    (insurance only) [phone]             [facility] (Groups &  Residential) [place], [place] [phone]     [facility]     [address]     [place], [place]      [phone]       [facility] (for caregivers and family) [phone] [place], [place] [phone]      Residential Treatment Programs [facility]      [address], [place], [place] [address]  [phone]       [facility] [address] [place], [place] [address] [phone]  [facility] [place]        [phone]       [facility] [phone]  [facility] ([facility]             [address]  [place], [place]                                                [phone] or [phone]                               [facility] [address] [place], [address] [phone]  [facility]    [address]      [place], [place]     [phone]       [facility] [address] [place], [place] [phone]  [facility]   [address] [place], [place] [address]     [phone]      Admissions: 8am-3pm M-F  [facility] ([facility]) [address] [place], [place] [phone]  [facility]: Residential Program [phone] Days)   [place], [place]      [phone] or [phone]     [facility]: [facility] [place], [place] (Walk in Hours over the weekend or by referral)  [facility] [address] [place], [place], [place] [address] [phone]  Crisis Mobile: [facility]:  [phone] (for crisis response 24 hours a day) [facility] Hotline:      3207853694 Outpatient Psychiatry and Counseling  [facility]: Mobile Crisis Management 24 hours:  [phone]  [facility] of the [employer] sliding scale fee and walk in schedule: M-F 8am-12pm/1pm-3pm [address]  [place], [place]  [address] [phone]  [facility] [address] [place], [place] [address] [phone]  [facility] ([facility] [employer])- new patient walk-in appointments available Monday - Friday 8am -3pm.          [address] [place], [place] [address] [phone] or crisis line- [phone]  [facility]/ Intensive Outpatient Therapy Program [address] [place], [place] [address] [phone]  [facility]                  Crisis Services      [phone]     [place], [place] [address]                 [facility]   [facility] [phone]. [address] [place], [place] [address]   [facility] Care          [address]  [place], [place] [address]       [phone]  [facility] [address] [place], [place] [address] [phone]  [facility]    [address]    [place], [place] [address]     [phone]       Andee Poles, MD     [address]     [place] [address]     [phone]       [facility] [address] [place] [address]  [facility]     [address]     [place], [place]      [phone]       [facility] Eulogio Ditch, [place] [phone]  [place], [place] [address] [phone]  [facility]     [address]     [place], [place] [address]     [phone]       [facility] [address] [place], [place] [address] [phone] Resources for Temporary Residential Assistance/Crisis Centers  [facility]) M-F 8am-3pm   [address] [place], [place] [address]   [phone] Services include: laundry, barbering, support groups, case management, phone  & computer access,  showers, AA/NA mtgs, mental health/substance abuse nurse, job skills class, disability information, VA assistance, spiritual classes, etc.   HOMELESS SHELTERS  [facility]     [facility]   [address] [place]     [phone]              [employer] (women and children)       [address] [place], [place] [address] [phone] [email or web address] for application and process Application Required  [facility]   [address]    [place] [address]     [phone]                    [facility] [address] [place], [place] [address] [phone] application appt.) Application Required  [facility] (women only)    [address]     [place], [place] [address]     [phone]      Intake starts 6pm daily Need valid ID, SSC, & Police report [employer] [address] [place], [place] [phone] Application Required  [employer] (men only)     [address]      [place], [place]     [phone]       [facility] (Pregnant women only) [address] [place], [place] [phone]  [facility]      [address]      [place], [place] [address]     [phone]             [facility] [address] [place], [place] [phone] 90 day commitment/SA/Application process  [facility](men only)     [address]     [place], [place]     [phone]       Check-in at [facility] [address] [place], [place] [address] [phone] Men/Women/Women and Children must be there by 7 pm  [facility] [place], [place] [phone]

## 2015-01-09 ENCOUNTER — Emergency Department (HOSPITAL_COMMUNITY)
Admission: EM | Admit: 2015-01-09 | Discharge: 2015-01-09 | Discharge status: Against Medical Advice | Payer: Medicaid Other | Attending: Emergency Medicine | Admitting: Emergency Medicine

## 2015-01-09 ENCOUNTER — Encounter (HOSPITAL_COMMUNITY): Payer: Self-pay | Admitting: Emergency Medicine

## 2015-01-09 DIAGNOSIS — F10129 Alcohol abuse with intoxication, unspecified: Secondary | ICD-10-CM | POA: Insufficient documentation

## 2015-01-09 NOTE — ED Notes (Signed)
Bed: WHALB Expected date:  Expected time:  Means of arrival:  Comments: Ems- ETOH 

## 2015-01-09 NOTE — ED Notes (Signed)
As this nurse was getting report, the pt became angry and asked to see the doctor. It was explained to the pt that the doctor was with another patient and would be with him as soon as she could. The pt stated he did not want to wait and stated he was leaving. It was explained to the pt he was leaving against medical advice. He consented to sign, but did not consent to departure vital signs. Physician was called by previous shift RN for discharge papers. Pt did not wish to wait for them.
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
## 2015-01-09 NOTE — ED Notes (Signed)
Pt was able to ambulate independently.

## 2015-01-27 ENCOUNTER — Emergency Department (HOSPITAL_COMMUNITY)
Admission: EM | Admit: 2015-01-27 | Discharge: 2015-01-27 | Disposition: A | Discharge status: Home / Self Care | Payer: Medicaid Other | Attending: Emergency Medicine | Admitting: Emergency Medicine

## 2015-01-27 ENCOUNTER — Encounter (HOSPITAL_COMMUNITY): Payer: Self-pay | Admitting: Emergency Medicine

## 2015-01-27 DIAGNOSIS — M545 Low back pain, unspecified: Secondary | ICD-10-CM

## 2015-01-27 DIAGNOSIS — G8929 Other chronic pain: Secondary | ICD-10-CM | POA: Insufficient documentation

## 2015-01-27 DIAGNOSIS — Z8719 Personal history of other diseases of the digestive system: Secondary | ICD-10-CM | POA: Insufficient documentation

## 2015-01-27 DIAGNOSIS — F1010 Alcohol abuse, uncomplicated: Secondary | ICD-10-CM | POA: Diagnosis not present

## 2015-01-27 DIAGNOSIS — Z79899 Other long term (current) drug therapy: Secondary | ICD-10-CM | POA: Diagnosis not present

## 2015-01-27 DIAGNOSIS — F319 Bipolar disorder, unspecified: Secondary | ICD-10-CM | POA: Diagnosis not present

## 2015-01-27 MED ORDER — KETOROLAC TROMETHAMINE 60 MG/2ML IM SOLN
60.0000 mg | Freq: Once | INTRAMUSCULAR | Status: AC
  Administered 2015-01-27: 60 mg via INTRAMUSCULAR
  Filled 2015-01-27: qty 2

## 2015-01-27 MED ORDER — CYCLOBENZAPRINE HCL 10 MG PO TABS: 10.0000 mg | ORAL_TABLET | Freq: Two times a day (BID) | ORAL | Status: DC | PRN

## 2015-01-27 NOTE — Discharge Instructions (Signed)
Back Pain, Adult Back pain is very common in adults.The cause of back pain is rarely dangerous and the pain often gets better over time.The cause of your back pain may not be known. Some common causes of back pain include:  Strain of the muscles or ligaments supporting the spine.  Wear and tear (degeneration) of the spinal disks.  Arthritis.  Direct injury to the back. For many people, back pain may return. Since back pain is rarely dangerous, most people can learn to manage this condition on their own. HOME CARE INSTRUCTIONS Watch your back pain for any changes. The following actions may help to lessen any discomfort you are feeling:  Remain active. It is stressful on your back to sit or stand in one place for Allan periods of time. Do not sit, drive, or stand in one place for more than 30 minutes at a time. Take short walks on even surfaces as soon as you are able.Try to increase the length of time you walk each day.  Exercise regularly as directed by your health care provider. Exercise helps your back heal faster. It also helps avoid future injury by keeping your muscles strong and flexible.  Do not stay in bed.Resting more than 1-2 days can delay your recovery.  Pay attention to your body when you bend and lift. The most comfortable positions are those that put less stress on your recovering back. Always use proper lifting techniques, including:  Bending your knees.  Keeping the load close to your body.  Avoiding twisting.  Find a comfortable position to sleep. Use a firm mattress and lie on your side with your knees slightly bent. If you lie on your back, put a pillow under your knees.  Avoid feeling anxious or stressed.Stress increases muscle tension and can worsen back pain.It is important to recognize when you are anxious or stressed and learn ways to manage it, such as with exercise.  Take medicines only as directed by your health care provider. Over-the-counter  medicines to reduce pain and inflammation are often the most helpful.Your health care provider may prescribe muscle relaxant drugs.These medicines help dull your pain so you can more quickly return to your normal activities and healthy exercise.  Apply ice to the injured area:  Put ice in a plastic bag.  Place a towel between your skin and the bag.  Leave the ice on for 20 minutes, 2-3 times a day for the first 2-3 days. After that, ice and heat may be alternated to reduce pain and spasms.  Maintain a healthy weight. Excess weight puts extra stress on your back and makes it difficult to maintain good posture. SEEK MEDICAL CARE IF:  You have pain that is not relieved with rest or medicine.  You have increasing pain going down into the legs or buttocks.  You have pain that does not improve in one week.  You have night pain.  You lose weight.  You have a fever or chills. SEEK IMMEDIATE MEDICAL CARE IF:   You develop new bowel or bladder control problems.  You have unusual weakness or numbness in your arms or legs.  You develop nausea or vomiting.  You develop abdominal pain.  You feel faint.   This information is not intended to replace advice given to you by your health care provider. Make sure you discuss any questions you have with your health care provider.   Document Released: 03/08/2005 Document Revised: 03/29/2014 Document Reviewed: 07/10/2013 Elsevier Interactive Patient Education 2016 Elsevier  Inc[employer]  Back Exercises The following exercises strengthen the muscles that help to support the back. They also help to keep the lower back flexible. Doing these exercises can help to prevent back pain or lessen existing pain. If you have back pain or discomfort, try doing these exercises 2-3 times each day or as told by your health care provider. When the pain goes away, do them once each day, but increase the number of times that you repeat the steps for each exercise (do more  repetitions). If you do not have back pain or discomfort, do these exercises once each day or as told by your health care provider. EXERCISES Single Knee to Chest Repeat these steps 3-5 times for each leg:  Lie on your back on a firm bed or the floor with your legs extended.  Bring one knee to your chest. Your other leg should stay extended and in contact with the floor.  Hold your knee in place by grabbing your knee or thigh.  Pull on your knee until you feel a gentle stretch in your lower back.  Hold the stretch for 10-30 seconds.  Slowly release and straighten your leg. Pelvic Tilt Repeat these steps 5-10 times:  Lie on your back on a firm bed or the floor with your legs extended.  Bend your knees so they are pointing toward the ceiling and your feet are flat on the floor.  Tighten your lower abdominal muscles to press your lower back against the floor. This motion will tilt your pelvis so your tailbone points up toward the ceiling instead of pointing to your feet or the floor.  With gentle tension and even breathing, hold this position for 5-10 seconds. Cat-Cow Repeat these steps until your lower back becomes more flexible:  Get into a hands-and-knees position on a firm surface. Keep your hands under your shoulders, and keep your knees under your hips. You may place padding under your knees for comfort.  Let your head hang down, and point your tailbone toward the floor so your lower back becomes rounded like the back of a cat.  Hold this position for 5 seconds.  Slowly lift your head and point your tailbone up toward the ceiling so your back forms a sagging arch like the back of a cow.  Hold this position for 5 seconds. Press-Ups Repeat these steps 5-10 times:  Lie on your abdomen (face-down) on the floor.  Place your palms near your head, about shoulder-width apart.  While you keep your back as relaxed as possible and keep your hips on the floor, slowly straighten  your arms to raise the top half of your body and lift your shoulders. Do not use your back muscles to raise your upper torso. You may adjust the placement of your hands to make yourself more comfortable.  Hold this position for 5 seconds while you keep your back relaxed.  Slowly return to lying flat on the floor. Bridges Repeat these steps 10 times:  Lie on your back on a firm surface.  Bend your knees so they are pointing toward the ceiling and your feet are flat on the floor.  Tighten your buttocks muscles and lift your buttocks off of the floor until your waist is at almost the same height as your knees. You should feel the muscles working in your buttocks and the back of your thighs. If you do not feel these muscles, slide your feet 1-2 inches farther away from your buttocks.  Hold this  position for 3-5 seconds.  Slowly lower your hips to the starting position, and allow your buttocks muscles to relax completely. If this exercise is too easy, try doing it with your arms crossed over your chest. Abdominal Crunches Repeat these steps 5-10 times:  Lie on your back on a firm bed or the floor with your legs extended.  Bend your knees so they are pointing toward the ceiling and your feet are flat on the floor.  Cross your arms over your chest.  Tip your chin slightly toward your chest without bending your neck.  Tighten your abdominal muscles and slowly raise your trunk (torso) high enough to lift your shoulder blades a tiny bit off of the floor. Avoid raising your torso higher than that, because it can put too much stress on your low back and it does not help to strengthen your abdominal muscles.  Slowly return to your starting position. Back Lifts Repeat these steps 5-10 times: 1. Lie on your abdomen (face-down) with your arms at your sides, and rest your forehead on the floor. 2. Tighten the muscles in your legs and your buttocks. 3. Slowly lift your chest off of the floor while  you keep your hips pressed to the floor. Keep the back of your head in line with the curve in your back. Your eyes should be looking at the floor. 4. Hold this position for 3-5 seconds. 5. Slowly return to your starting position. SEEK MEDICAL CARE IF:  Your back pain or discomfort gets much worse when you do an exercise.  Your back pain or discomfort does not lessen within 2 hours after you exercise. If you have any of these problems, stop doing these exercises right away. Do not do them again unless your health care provider says that you can. SEEK IMMEDIATE MEDICAL CARE IF:  You develop sudden, severe back pain. If this happens, stop doing the exercises right away. Do not do them again unless your health care provider says that you can.   This information is not intended to replace advice given to you by your health care provider. Make sure you discuss any questions you have with your health care provider.   Document Released: 04/15/2004 Document Revised: 11/27/2014 Document Reviewed: 05/02/2014 Elsevier Interactive Patient Education 2016 [place] Injury Prevention Back injuries can be very painful. They can also be difficult to heal. After having one back injury, you are more likely to injure your back again. It is important to learn how to avoid injuring or re-injuring your back. The following tips can help you to prevent a back injury. WHAT SHOULD I KNOW ABOUT PHYSICAL FITNESS?  Exercise for 30 minutes per day on most days of the week or as directed by your health care provider. Make sure to:  Do aerobic exercises, such as walking, jogging, biking, or swimming.  Do exercises that increase balance and strength, such as tai chi and yoga. These can decrease your risk of falling and injuring your back.  Do stretching exercises to help with flexibility.  Try to develop strong abdominal muscles. Your abdominal muscles provide a lot of the support that is needed by your  back.  Maintain a healthy weight. This helps to decrease your risk of a back injury. WHAT SHOULD I KNOW ABOUT MY DIET?  Talk with your health care provider about your overall diet. Take supplements and vitamins only as directed by your health care provider.  Talk with your health care provider about how  much calcium and vitamin D you need each day. These nutrients help to prevent weakening of the bones (osteoporosis). Osteoporosis can cause broken (fractured) bones, which lead to back pain.  Include good sources of calcium in your diet, such as dairy products, green leafy vegetables, and products that have had calcium added to them (fortified).  Include good sources of vitamin D in your diet, such as milk and foods that are fortified with vitamin D. WHAT SHOULD I KNOW ABOUT MY POSTURE?  Sit up straight and stand up straight. Avoid leaning forward when you sit or hunching over when you stand.  Choose chairs that have good low-back (lumbar) support.  If you work at a desk, sit close to it so you do not need to lean over. Keep your chin tucked in. Keep your neck drawn back, and keep your elbows bent at a right angle. Your arms should look like the letter "L."  Sit high and close to the steering wheel when you drive. Add a lumbar support to your car seat, if needed.  Avoid sitting or standing in one position for very [place]. Take breaks to get up, stretch, and walk around at least one time every hour. Take breaks every hour if you are driving for [place] periods of time.  Sleep on your side with your knees slightly bent, or sleep on your back with a pillow under your knees. Do not lie on the front of your body to sleep. WHAT SHOULD I KNOW ABOUT LIFTING, TWISTING, AND REACHING? Lifting and Heavy Lifting  Avoid heavy lifting, especially repetitive heavy lifting. If you must do heavy lifting:  Stretch before lifting.  Work slowly.  Rest between lifts.  Use a tool such as a cart or a dolly to  move objects if one is available.  Make several small trips instead of carrying one heavy load.  Ask for help when you need it, especially when moving big objects.  Follow these steps when lifting:  Stand with your feet shoulder-width apart.  Get as close to the object as you can. Do not try to pick up a heavy object that is far from your body.  Use handles or lifting straps if they are available.  Bend at your knees. Squat down, but keep your heels off the floor.  Keep your shoulders pulled back, your chin tucked in, and your back straight.  Lift the object slowly while you tighten the muscles in your legs, abdomen, and buttocks. Keep the object as close to the center of your body as possible.  Follow these steps when putting down a heavy load:  Stand with your feet shoulder-width apart.  Lower the object slowly while you tighten the muscles in your legs, abdomen, and buttocks. Keep the object as close to the center of your body as possible.  Keep your shoulders pulled back, your chin tucked in, and your back straight.  Bend at your knees. Squat down, but keep your heels off the floor.  Use handles or lifting straps if they are available. Twisting and Reaching  Avoid lifting heavy objects above your waist.  Do not twist at your waist while you are lifting or carrying a load. If you need to turn, move your feet.  Do not bend over without bending at your knees.  Avoid reaching over your head, across a table, or for an object on a high surface. WHAT ARE SOME OTHER TIPS?  Avoid wet floors and icy ground. Keep sidewalks clear of ice  to prevent falls.  Do not sleep on a mattress that is too soft or too hard.  Keep items that are used frequently within easy reach.  Put heavier objects on shelves at waist level, and put lighter objects on lower or higher shelves.  Find ways to decrease your stress, such as exercise, massage, or relaxation techniques. Stress can build up in  your muscles. Tense muscles are more vulnerable to injury.  Talk with your health care provider if you feel anxious or depressed. These conditions can make back pain worse.  Wear flat heel shoes with cushioned soles.  Avoid sudden movements.  Use both shoulder straps when carrying a backpack.  Do not use any tobacco products, including cigarettes, chewing tobacco, or electronic cigarettes. If you need help quitting, ask your health care provider.   This information is not intended to replace advice given to you by your health care provider. Make sure you discuss any questions you have with your health care provider.   Follow up with primary care provider and pain management clinic for [place] term management of back pain. Return to the Emergency Department if you experience bowel/bladder incontinence, numbness/tingling down both legs.

## 2015-01-27 NOTE — ED Notes (Signed)
Per pt/family-states back pain on and off since July-has been drinking

## 2015-01-27 NOTE — ED Provider Notes (Signed)
CSN: [id number]     Arrival date & time 01/27/15  1529 History  By signing my name below, I, Sonum Patel, attest that this documentation has been prepared under the direction and in the presence of [employer]. Electronically Signed: Sonum Patel, Neurosurgeon. 01/27/2015. 5:28 PM.    Chief Complaint  Patient presents with  . Back Pain   HPI  HPI Comments: Craig [name] is a 54 y.o. male with a pmhx of alcohol abuse, bipolar disorder, chronic back pain who presents to the Emergency Department complaining of 4 days of constant, unchanged lower back pain after heavy lifting. He has had intermittent back pain since July 2016 when he had a fall. He states he is followed by a pain management clinic and had an appointment today which he was unable to attend. No numbness/tingling in lower extremities. Denies bowel/bladder incontinence, saddle anesthesia. Pt is able to ambulate without difficulty.   Past Medical History  Diagnosis Date  . Back pain   . Neck pain   . Bipolar 1 disorder (HCC)   . ETOH abuse   . Withdrawal seizures (HCC)   . Pancreatitis    History reviewed. No pertinent past surgical history. Family History  Problem Relation Age of Onset  . Hypertension Mother    Social History  Substance Use Topics  . Smoking status: Never Smoker   . Smokeless tobacco: Never Used  . Alcohol Use: Yes     Comment: Last drink: Yesterday    Review of Systems  All other systems reviewed and are negative.     Allergies  Haldol; Thorazine ; Hydrocodone; Lithium; and Seroquel  Home Medications   Prior to Admission medications   Medication Sig Start Date End Date Taking? Authorizing Provider  cetirizine (ZYRTEC) 10 MG tablet Take 10 mg by mouth daily.    Historical Provider, MD  chlordiazePOXIDE (LIBRIUM) 25 MG capsule  PO TID x 1D, then 25-50mg  PO BID X 1D, then 25-50mg  PO QD X 1D 01/08/15   Shari Upstill, PA-C  clonazePAM (KLONOPIN) 0.5 MG tablet Take 0.5 mg by mouth 3 (three)  times daily.    Historical Provider, MD  HYDROcodone-acetaminophen (NORCO/VICODIN) 5-325 MG tablet Take 1 tablet by mouth daily.    Historical Provider, MD  ibuprofen (ADVIL,MOTRIN) 200 MG tablet Take 200 mg by mouth every 6 (six) hours as needed.    Historical Provider, MD  lisdexamfetamine (VYVANSE) 50 MG capsule Take 50 mg by mouth daily.    Historical Provider, MD  lurasidone (LATUDA) 80 MG TABS tablet Take 80 mg by mouth daily with breakfast.    Historical Provider, MD  Multiple Vitamin (MULTIVITAMIN WITH MINERALS) TABS tablet Take 1 tablet by mouth daily.    Historical Provider, MD  oxyCODONE-acetaminophen (PERCOCET/ROXICET) 5-325 MG tablet Take 1 tablet by mouth 3 (three) times daily as needed. 12/25/14   Historical Provider, MD  tamsulosin (FLOMAX) 0.4 MG CAPS capsule Take 0.4 mg by mouth daily. 12/16/14   Historical Provider, MD  traMADol (ULTRAM) 50 MG tablet Take 1 tablet (50 mg total) by mouth every 6 (six) hours as needed. Patient not taking: Reported on 12/31/2014 12/02/14   Raeford Razor, MD  traZODone (DESYREL) 50 MG tablet Take 100 mg by mouth at bedtime. 12/15/14   Historical Provider, MD   BP 126/72 mmHg  Pulse 95  Temp(Src) 98.3 F (36.8 C) (Oral)  SpO2 94% Physical Exam  Constitutional: He is oriented to person, place, and time. He appears well-developed and well-nourished. No distress.  HENT:  Head: Normocephalic and atraumatic.  Mouth/Throat: No oropharyngeal exudate.  Eyes: Conjunctivae and EOM are normal. Pupils are equal, round, and reactive to light. Right eye exhibits no discharge. Left eye exhibits no discharge. No scleral icterus.  Neck: Neck supple.  Cardiovascular: Normal rate, regular rhythm, normal heart sounds and intact distal pulses.  Exam reveals no gallop and no friction rub.   No murmur heard. Pulmonary/Chest: Effort normal and breath sounds normal. No respiratory distress. He has no wheezes. He has no rales. He exhibits no tenderness.  Abdominal: Soft.  He exhibits no distension. There is no tenderness. There is no guarding.  Musculoskeletal: Normal range of motion. He exhibits no edema.  Tenderness to palpation of bilateral lumbar paraspinal muscles. No midline spinal tenderness.  Lymphadenopathy:    He has no cervical adenopathy.  Neurological: He is alert and oriented to person, place, and time.  Strength 5/5 throughout. No sensory deficits.  No gait abnormality.  Skin: Skin is warm and dry. No rash noted. He is not diaphoretic. No erythema. No pallor.  Psychiatric:  Patient is intoxicated. Admits to drinking significant amount of alcohol prior to arrival. Slurring speech.  Nursing note and vitals reviewed.   ED Course  Procedures (including critical care time)  DIAGNOSTIC STUDIES: Oxygen Saturation is 100% on RA, normal by my interpretation.    COORDINATION OF CARE: 5:27 PM Discussed treatment plan with pt at bedside and pt agreed to plan.   Labs Review Labs Reviewed - No data to display  Imaging Review No results found. I have personally reviewed and evaluated these images and lab results as part of my medical decision-making.   EKG Interpretation None      MDM   Final diagnoses:  Bilateral low back pain without sciatica   54 year old male with chronic back pain, alcohol abuse, bipolar presents to the ED complaining of lower back pain. Patient is intoxicated in the emergency department. Admits to drinking heavily prior to arrival. Patient fell in August of this year and injured his back. He has been having pain there ever since. He also states he lifted a heavy box several days ago now with increased pain. No trauma. No neurological symptoms. No numbness, tingling, paresthesias in lower extremities. No bowel bladder incontinence, saddle anesthesia. Patient able to ambulate without difficulty in the emergency department. No additional imaging recommended at this time as pain is consistent with previous back pain emergency  Department visits. Suspect increase in back pain is due to acute muscle spasms. Will give muscle relaxer and recommended ibuprofen for pain management. Patient is under pain contract, recommend follow-up with PCP and pain management doctor for [place]-term pain management. Discussed this treatment plan with patient who is agreeable. Patient stable for discharge.   I personally performed the services described in this documentation, which was scribed in my presence. The recorded information has been reviewed and is accurate.     Lester KinsmanSamantha Tripp TatumDowless, PA-C 01/28/15 2121  Dione Boozeavid Glick, MD 01/29/15 [phone]

## 2015-01-28 ENCOUNTER — Emergency Department (HOSPITAL_COMMUNITY)
Admission: EM | Admit: 2015-01-28 | Discharge: 2015-01-28 | Disposition: A | Discharge status: Home / Self Care | Payer: Medicaid Other | Attending: Emergency Medicine | Admitting: Emergency Medicine

## 2015-01-28 ENCOUNTER — Encounter (HOSPITAL_COMMUNITY): Payer: Self-pay | Admitting: Emergency Medicine

## 2015-01-28 ENCOUNTER — Emergency Department (HOSPITAL_COMMUNITY): Payer: Medicaid Other

## 2015-01-28 DIAGNOSIS — G8929 Other chronic pain: Secondary | ICD-10-CM | POA: Diagnosis not present

## 2015-01-28 DIAGNOSIS — Y998 Other external cause status: Secondary | ICD-10-CM | POA: Insufficient documentation

## 2015-01-28 DIAGNOSIS — Y9389 Activity, other specified: Secondary | ICD-10-CM | POA: Insufficient documentation

## 2015-01-28 DIAGNOSIS — Z791 Long term (current) use of non-steroidal anti-inflammatories (NSAID): Secondary | ICD-10-CM | POA: Diagnosis not present

## 2015-01-28 DIAGNOSIS — X58XXXA Exposure to other specified factors, initial encounter: Secondary | ICD-10-CM | POA: Diagnosis not present

## 2015-01-28 DIAGNOSIS — M5441 Lumbago with sciatica, right side: Secondary | ICD-10-CM

## 2015-01-28 DIAGNOSIS — Z79899 Other long term (current) drug therapy: Secondary | ICD-10-CM | POA: Diagnosis not present

## 2015-01-28 DIAGNOSIS — R4781 Slurred speech: Secondary | ICD-10-CM | POA: Insufficient documentation

## 2015-01-28 DIAGNOSIS — S3992XA Unspecified injury of lower back, initial encounter: Secondary | ICD-10-CM | POA: Diagnosis present

## 2015-01-28 DIAGNOSIS — F319 Bipolar disorder, unspecified: Secondary | ICD-10-CM | POA: Diagnosis not present

## 2015-01-28 DIAGNOSIS — Y9289 Other specified places as the place of occurrence of the external cause: Secondary | ICD-10-CM | POA: Insufficient documentation

## 2015-01-28 DIAGNOSIS — M5442 Lumbago with sciatica, left side: Secondary | ICD-10-CM

## 2015-01-28 MED ORDER — HYDROCODONE-ACETAMINOPHEN 5-325 MG PO TABS: 1.0000 | ORAL_TABLET | Freq: Once | ORAL | Status: DC

## 2015-01-28 MED ORDER — OXYCODONE-ACETAMINOPHEN 5-325 MG PO TABS
1.0000 | ORAL_TABLET | Freq: Once | ORAL | Status: AC
  Administered 2015-01-28: 1 via ORAL
  Filled 2015-01-28: qty 1

## 2015-01-28 MED ORDER — PREDNISONE 20 MG PO TABS: 40.0000 mg | ORAL_TABLET | Freq: Every day | ORAL | Status: DC
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
## 2015-01-28 NOTE — Discharge Instructions (Signed)
Continue ibuprofen or Tylenol for pain. Make sure you take Flexeril as well as prescribed yesterday. Prednisone as prescribed until all gone for inflammation. Follow with primary care doctor.  Back Pain, Adult Back pain is very common in adults.The cause of back pain is rarely dangerous and the pain often gets better over time.The cause of your back pain may not be known. Some common causes of back pain include:  Strain of the muscles or ligaments supporting the spine.  Wear and tear (degeneration) of the spinal disks.  Arthritis.  Direct injury to the back. For many people, back pain may return. Since back pain is rarely dangerous, most people can learn to manage this condition on their own. HOME CARE INSTRUCTIONS Watch your back pain for any changes. The following actions may help to lessen any discomfort you are feeling:  Remain active. It is stressful on your back to sit or stand in one place for [place] periods of time. Do not sit, drive, or stand in one place for more than 30 minutes at a time. Take short walks on even surfaces as soon as you are able.Try to increase the length of time you walk each day.  Exercise regularly as directed by your health care provider. Exercise helps your back heal faster. It also helps avoid future injury by keeping your muscles strong and flexible.  Do not stay in bed.Resting more than 1-2 days can delay your recovery.  Pay attention to your body when you bend and lift. The most comfortable positions are those that put less stress on your recovering back. Always use proper lifting techniques, including:  Bending your knees.  Keeping the load close to your body.  Avoiding twisting.  Find a comfortable position to sleep. Use a firm mattress and lie on your side with your knees slightly bent. If you lie on your back, put a pillow under your knees.  Avoid feeling anxious or stressed.Stress increases muscle tension and can worsen back pain.It is  important to recognize when you are anxious or stressed and learn ways to manage it, such as with exercise.  Take medicines only as directed by your health care provider. Over-the-counter medicines to reduce pain and inflammation are often the most helpful.Your health care provider may prescribe muscle relaxant drugs.These medicines help dull your pain so you can more quickly return to your normal activities and healthy exercise.  Apply ice to the injured area:  Put ice in a plastic bag.  Place a towel between your skin and the bag.  Leave the ice on for 20 minutes, 2-3 times a day for the first 2-3 days. After that, ice and heat may be alternated to reduce pain and spasms.  Maintain a healthy weight. Excess weight puts extra stress on your back and makes it difficult to maintain good posture. SEEK MEDICAL CARE IF:  You have pain that is not relieved with rest or medicine.  You have increasing pain going down into the legs or buttocks.  You have pain that does not improve in one week.  You have night pain.  You lose weight.  You have a fever or chills. SEEK IMMEDIATE MEDICAL CARE IF:   You develop new bowel or bladder control problems.  You have unusual weakness or numbness in your arms or legs.  You develop nausea or vomiting.  You develop abdominal pain.  You feel faint.   This information is not intended to replace advice given to you by your health care provider.  Make sure you discuss any questions you have with your health care provider.   Document Released: 03/08/2005 Document Revised: 03/29/2014 Document Reviewed: 07/10/2013 Elsevier Interactive Patient Education [employer].

## 2015-01-28 NOTE — ED Notes (Signed)
Patient transported to X-ray 

## 2015-01-28 NOTE — ED Notes (Signed)
Patient was alert, oriented and stable upon discharge. RN went over AVS and patient had no further questions. Pt was to be picked up by friend.

## 2015-01-28 NOTE — ED Notes (Signed)
Pt walked w/o assistance. PA witnessed this as well.

## 2015-01-28 NOTE — ED Notes (Signed)
Pr was seen yesterday for same c/o back pain. Pt states " i cannot take it anymore."

## 2015-01-31 ENCOUNTER — Emergency Department (HOSPITAL_COMMUNITY)
Admission: EM | Admit: 2015-01-31 | Discharge: 2015-01-31 | Disposition: A | Discharge status: Home / Self Care | Payer: Medicaid Other | Attending: Emergency Medicine | Admitting: Emergency Medicine

## 2015-01-31 ENCOUNTER — Encounter (HOSPITAL_COMMUNITY): Payer: Self-pay | Admitting: Emergency Medicine

## 2015-01-31 DIAGNOSIS — Z79899 Other long term (current) drug therapy: Secondary | ICD-10-CM | POA: Diagnosis not present

## 2015-01-31 DIAGNOSIS — Z8719 Personal history of other diseases of the digestive system: Secondary | ICD-10-CM | POA: Insufficient documentation

## 2015-01-31 DIAGNOSIS — F10120 Alcohol abuse with intoxication, uncomplicated: Secondary | ICD-10-CM | POA: Insufficient documentation

## 2015-01-31 DIAGNOSIS — Z7952 Long term (current) use of systemic steroids: Secondary | ICD-10-CM | POA: Insufficient documentation

## 2015-01-31 DIAGNOSIS — F1510 Other stimulant abuse, uncomplicated: Secondary | ICD-10-CM | POA: Insufficient documentation

## 2015-01-31 DIAGNOSIS — R4182 Altered mental status, unspecified: Secondary | ICD-10-CM | POA: Diagnosis present

## 2015-01-31 DIAGNOSIS — F319 Bipolar disorder, unspecified: Secondary | ICD-10-CM | POA: Diagnosis not present

## 2015-01-31 DIAGNOSIS — F10920 Alcohol use, unspecified with intoxication, uncomplicated: Secondary | ICD-10-CM

## 2015-01-31 LAB — ETHANOL: ALCOHOL ETHYL (B): 275 mg/dL — AB (ref <5)

## 2015-01-31 LAB — RAPID URINE DRUG SCREEN, HOSP PERFORMED
Amphetamines: POSITIVE — AB
BARBITURATES: NOT DETECTED
Benzodiazepines: NOT DETECTED
Cocaine: NOT DETECTED
Opiates: NOT DETECTED
TETRAHYDROCANNABINOL: NOT DETECTED

## 2015-01-31 LAB — URINALYSIS, ROUTINE W REFLEX MICROSCOPIC
BILIRUBIN URINE: NEGATIVE
Glucose, UA: NEGATIVE mg/dL
Hgb urine dipstick: NEGATIVE
KETONES UR: NEGATIVE mg/dL
LEUKOCYTES UA: NEGATIVE
NITRITE: NEGATIVE
PROTEIN: NEGATIVE mg/dL
Specific Gravity, Urine: 1.013 (ref 1.005–1.030)
UROBILINOGEN UA: 0.2 mg/dL (ref 0.0–1.0)
pH: 7 (ref 5.0–8.0)

## 2015-01-31 LAB — CBC
HEMATOCRIT: 35.8 % — AB (ref 39.0–52.0)
HEMOGLOBIN: 12.5 g/dL — AB (ref 13.0–17.0)
MCH: 31.8 pg (ref 26.0–34.0)
MCHC: 34.9 g/dL (ref 30.0–36.0)
MCV: 91.1 fL (ref 78.0–100.0)
Platelets: 263 10*3/uL (ref 150–400)
RBC: 3.93 MIL/uL — ABNORMAL LOW (ref 4.22–5.81)
RDW: 14.7 % (ref 11.5–15.5)
WBC: 5.9 10*3/uL (ref 4.0–10.5)

## 2015-01-31 LAB — COMPREHENSIVE METABOLIC PANEL
ALBUMIN: 3.9 g/dL (ref 3.5–5.0)
ALK PHOS: 98 U/L (ref 38–126)
ALT: 75 U/L — ABNORMAL HIGH (ref 17–63)
ANION GAP: 8 (ref 5–15)
AST: 60 U/L — ABNORMAL HIGH (ref 15–41)
BILIRUBIN TOTAL: 0.4 mg/dL (ref 0.3–1.2)
BUN: 9 mg/dL (ref 6–20)
CALCIUM: 8.2 mg/dL — AB (ref 8.9–10.3)
CO2: 25 mmol/L (ref 22–32)
Chloride: 109 mmol/L (ref 101–111)
Creatinine, Ser: 0.58 mg/dL — ABNORMAL LOW (ref 0.61–1.24)
GFR calc Af Amer: >60 mL/min (ref >60)
GLUCOSE: 131 mg/dL — AB (ref 65–99)
POTASSIUM: 3.3 mmol/L — AB (ref 3.5–5.1)
Sodium: 142 mmol/L (ref 135–145)
TOTAL PROTEIN: 6.8 g/dL (ref 6.5–8.1)

## 2015-01-31 LAB — CBG MONITORING, ED: GLUCOSE-CAPILLARY: 119 mg/dL — AB (ref 65–99)

## 2015-01-31 LAB — ACETAMINOPHEN LEVEL

## 2015-01-31 LAB — SALICYLATE LEVEL: Salicylate Lvl: <4 mg/dL (ref 2.8–30.0)

## 2015-01-31 MED ORDER — SODIUM CHLORIDE 0.9 % IV SOLN
INTRAVENOUS | Status: DC
  Administered 2015-01-31: 02:00:00 via INTRAVENOUS

## 2015-01-31 MED ORDER — POTASSIUM CHLORIDE 10 MEQ/100ML IV SOLN
10.0000 meq | Freq: Once | INTRAVENOUS | Status: AC
  Administered 2015-01-31: 10 meq via INTRAVENOUS
  Filled 2015-01-31: qty 100

## 2015-01-31 MED ORDER — SODIUM CHLORIDE 0.9 % IV BOLUS (SEPSIS)
1000.0000 mL | Freq: Once | INTRAVENOUS | Status: AC
  Administered 2015-01-31: 1000 mL via INTRAVENOUS

## 2015-01-31 NOTE — ED Notes (Signed)
Bed: RESB Expected date:  Expected time:  Means of arrival:  Comments: EMS/altered mental status 

## 2015-01-31 NOTE — ED Provider Notes (Signed)
By signing my name below, I, Craig [name], attest that this documentation has been prepared under the direction and in the presence of Enbridge EnergyKristen N Ward, DO. Electronically Signed: Budd PalmerVanessa [name], ED Scribe. 01/31/2015. 1:43 AM.  TIME SEEN: 1:37 AM  CHIEF COMPLAINT: AMS  HPI: Craig [name] is a 54 y.o. male with history of bipolar disorder, alcohol abuse brought in by ambulance, who presents to the Emergency Department complaining of AMS onset PTA. Pt states he does not know why he is in the hospital. He notes he drank 2 beers at lunchtime, but denies drug use or taking too much of his medications. Was found with a bottle of prednisone and Librium but does not appear to have taken more than what was prescribed since the date they were filled. Pt denies any pain or injury, as well as SI and HI.   ROS: Level 5 Caveat for AMS  PAST MEDICAL HISTORY/PAST SURGICAL HISTORY:  Past Medical History  Diagnosis Date  . Back pain   . Neck pain   . Bipolar 1 disorder (HCC)   . ETOH abuse   . Withdrawal seizures (HCC)   . Pancreatitis     MEDICATIONS:  Prior to Admission medications   Medication Sig Start Date End Date Taking? Authorizing Provider  chlordiazePOXIDE (LIBRIUM) 25 MG capsule 50mg  PO TID x 1D, then 25-50mg  PO BID X 1D, then 25-50mg  PO QD X 1D 01/08/15   Shari Upstill, PA-C  clonazePAM (KLONOPIN) 0.5 MG tablet Take 0.5 mg by mouth 3 (three) times daily.    Historical Provider, MD  cyclobenzaprine (FLEXERIL) 10 MG tablet Take 1 tablet (10 mg total) by mouth 2 (two) times daily as needed for muscle spasms. 01/27/15   Samantha Tripp Dowless, PA-C  ibuprofen (ADVIL,MOTRIN) 200 MG tablet Take 800-1,000 mg by mouth every 6 (six) hours as needed.     Historical Provider, MD  lisdexamfetamine (VYVANSE) 50 MG capsule Take 50 mg by mouth daily.    Historical Provider, MD  lurasidone (LATUDA) 80 MG TABS tablet Take 80 mg by mouth daily with breakfast.    Historical Provider, MD  Multiple Vitamin  (MULTIVITAMIN WITH MINERALS) TABS tablet Take 1 tablet by mouth daily.    Historical Provider, MD  predniSONE (DELTASONE) 20 MG tablet Take 2 tablets (40 mg total) by mouth daily. 01/28/15   Tatyana Kirichenko, PA-C  tamsulosin (FLOMAX) 0.4 MG CAPS capsule Take 0.4 mg by mouth daily. 12/16/14   Historical Provider, MD  traMADol (ULTRAM) 50 MG tablet Take 1 tablet (50 mg total) by mouth every 6 (six) hours as needed. Patient not taking: Reported on 12/31/2014 12/02/14   Raeford RazorStephen Kohut, MD  traZODone (DESYREL) 50 MG tablet Take 100 mg by mouth at bedtime. 12/15/14   Historical Provider, MD    ALLERGIES:  Allergies  Allergen Reactions  . Haldol [Haloperidol] Anaphylaxis  . Thorazine  [Chlorpromazine] Anaphylaxis    Made tongueAnd unable to breathe  . Hydrocodone Other (See Comments)    Throat sore  . Lithium Other (See Comments)    unknown  . Seroquel [Quetiapine Fumarate] Other (See Comments)    Hallucinations     SOCIAL HISTORY:  Social History  Substance Use Topics  . Smoking status: Never Smoker   . Smokeless tobacco: Never Used  . Alcohol Use: Yes     Comment: Last drink: Yesterday    FAMILY HISTORY: Family History  Problem Relation Age of Onset  . Hypertension Mother     EXAM: BP 135/97 mmHg  Pulse 120  Temp(Src) 98.7 F (37.1 C) (Oral)  Resp 18  SpO2 93% CONSTITUTIONAL: Alert and oriented to person and place, but not time, and intermittently responds appropriately to some questions; smells of alcohol HEAD: Normocephalic EYES: Conjunctivae clear, PERRL ENT: normal nose; no rhinorrhea; moist mucous membranes; pharynx without lesions noted NECK: Supple, no meningismus, no LAD  CARD: regular and tachycardic; S1 and S2 appreciated; no murmurs, no clicks, no rubs, no gallops RESP: Normal chest excursion without splinting or tachypnea; breath sounds clear and equal bilaterally; no wheezes, no rhonchi, no rales, no hypoxia or respiratory distress, speaking full  sentences ABD/GI: Normal bowel sounds; non-distended; soft, non-tender, no rebound, no guarding, no peritoneal signs BACK:  The back appears normal and is non-tender to palpation, there is no CVA tenderness EXT: Normal ROM in all joints; non-tender to palpation; no edema; normal capillary refill; no cyanosis, no calf tenderness or swelling    SKIN: Normal color for age and race; warm NEURO: Moves all extremities equally, sensation to light touch intact diffusely, cranial nerves II through XII intact, no clonus PSYCH: No SI or HI. Pt is drowsy, but arousable and appears disheveled.  MEDICAL DECISION MAKING: Patient here with altered mental status likely from alcohol intoxication. Alcohol level is 297. Hemodynamically stable other than a heart rate of 120. He is not febrile. We'll give IV fluids and continue to monitor until clinically sober. Otherwise labs unremarkable. Urine drug screen is positive for amphetamines. Patient reports he is on Adderall. He denies SI or HI.  ED PROGRESS: 6:00 AM  Pt has been monitored for over 5 hours. His heart rate is now in the low 100s at rest but does go up into the 130s when he is walking around. He seems slightly tremulous and may be going through early withdrawal. He states he does not want detox. He does have a history of previous alcoholic withdrawal seizures. States that he drinks alcohol every day. He is now able to tell me what year it is and states that he is here because he drank too much all last night. He states that he has court in the morning and would like to make it to the courthouse. He will take the bus home. He has been able to walk without difficulty and is drinking without vomiting. I feel this time he is clinically sober and safe to be discharged. Suspect that he operates with a low level of alcohol in his system at all times.    EKG Interpretation  Date/Time:  Friday January 31 2015 00:43:13 EST Ventricular Rate:  121 PR Interval:  148 QRS  Duration: 112 QT Interval:  341 QTC Calculation: 484 R Axis:   -10 Text Interpretation:  Sinus tachycardia Incomplete right bundle branch block Borderline prolonged QT interval No significant change since last tracing other than rate is faster Confirmed by WARD,  DO, KRISTEN ([id number]) on 01/31/2015 3:22:34 AM        I personally performed the services described in this documentation, which was scribed in my presence. The recorded information has been reviewed and is accurate.   Layla Maw Ward, DO 01/31/15 [phone]

## 2015-01-31 NOTE — ED Notes (Signed)
Pt confused and trying to get out of bed. Pt was redirected. Pt states he just took one klonopin and one adderal tonight "just like he was told to"

## 2015-01-31 NOTE — ED Notes (Signed)
Pt here via EMS from a [facility] house. Pt told EMS he had 2 beers tonight. A roommate told EMS that the pt "may have taken some pills" Pt has prednisone and librium bottles with him, but according to the date the bottles were filled, there are about the number of pills as there should be in each bottle. Pt is confused and disoriented.

## 2015-01-31 NOTE — Discharge Instructions (Signed)
Alcohol Use Disorder °Alcohol use disorder is a mental disorder. It is not a one-time incident of heavy drinking. Alcohol use disorder is the excessive and uncontrollable use of alcohol over time that leads to problems with functioning in one or more areas of daily living. People with this disorder risk harming themselves and others when they drink to excess. Alcohol use disorder also can cause other mental disorders, such as mood and anxiety disorders, and serious physical problems. People with alcohol use disorder often misuse other drugs.  °Alcohol use disorder is common and widespread. Some people with this disorder drink alcohol to cope with or escape from negative life events. Others drink to relieve chronic pain or symptoms of mental illness. People with a family history of alcohol use disorder are at higher risk of losing control and using alcohol to excess.  °Drinking too much alcohol can cause injury, accidents, and health problems. One drink can be too much when you are: °· Working. °· Pregnant or breastfeeding. °· Taking medicines. Ask your doctor. °· Driving or planning to drive. °SYMPTOMS  °Signs and symptoms of alcohol use disorder may include the following:  °· Consumption of alcohol in larger amounts or over a longer period of time than intended. °· Multiple unsuccessful attempts to cut down or control alcohol use.   °· A great deal of time spent obtaining alcohol, using alcohol, or recovering from the effects of alcohol (hangover). °· A strong desire or urge to use alcohol (cravings).   °· Continued use of alcohol despite problems at work, school, or home because of alcohol use.   °· Continued use of alcohol despite problems in relationships because of alcohol use. °· Continued use of alcohol in situations when it is physically hazardous, such as driving a car. °· Continued use of alcohol despite awareness of a physical or psychological problem that is likely related to alcohol use. Physical  problems related to alcohol use can involve the brain, heart, liver, stomach, and intestines. Psychological problems related to alcohol use include intoxication, depression, anxiety, psychosis, delirium, and dementia.   °· The need for increased amounts of alcohol to achieve the same desired effect, or a decreased effect from the consumption of the same amount of alcohol (tolerance). °· Withdrawal symptoms upon reducing or stopping alcohol use, or alcohol use to reduce or avoid withdrawal symptoms. Withdrawal symptoms include: °¨ Racing heart. °¨ Hand tremor. °¨ Difficulty sleeping. °¨ Nausea. °¨ Vomiting. °¨ Hallucinations. °¨ Restlessness. °¨ Seizures. °DIAGNOSIS °Alcohol use disorder is diagnosed through an assessment by your health care provider. Your health care provider may start by asking three or four questions to screen for excessive or problematic alcohol use. To confirm a diagnosis of alcohol use disorder, at least two symptoms must be present within a 12-month period. The severity of alcohol use disorder depends on the number of symptoms: °· Mild--two or three. °· Moderate--four or five. °· Severe--six or more. °Your health care provider may perform a physical exam or use results from lab tests to see if you have physical problems resulting from alcohol use. Your health care provider may refer you to a mental health professional for evaluation. °TREATMENT  °Some people with alcohol use disorder are able to reduce their alcohol use to low-risk levels. Some people with alcohol use disorder need to quit drinking alcohol. When necessary, mental health professionals with specialized training in substance use treatment can help. Your health care provider can help you decide how severe your alcohol use disorder is and what type of treatment you need.   The following forms of treatment are available:  °· Detoxification. Detoxification involves the use of prescription medicines to prevent alcohol withdrawal  symptoms in the first week after quitting. This is important for people with a history of symptoms of withdrawal and for heavy drinkers who are likely to have withdrawal symptoms. Alcohol withdrawal can be dangerous and, in severe cases, cause death. Detoxification is usually provided in a hospital or in-patient substance use treatment facility. °· Counseling or talk therapy. Talk therapy is provided by substance use treatment counselors. It addresses the reasons people use alcohol and ways to keep them from drinking again. The goals of talk therapy are to help people with alcohol use disorder find healthy activities and ways to cope with life stress, to identify and avoid triggers for alcohol use, and to handle cravings, which can cause relapse. °· Medicines. Different medicines can help treat alcohol use disorder through the following actions: °¨ Decrease alcohol cravings. °¨ Decrease the positive reward response felt from alcohol use. °¨ Produce an uncomfortable physical reaction when alcohol is used (aversion therapy). °· Support groups. Support groups are run by people who have quit drinking. They provide emotional support, advice, and guidance. °These forms of treatment are often combined. Some people with alcohol use disorder benefit from intensive combination treatment provided by specialized substance use treatment centers. Both inpatient and outpatient treatment programs are available. °  °This information is not intended to replace advice given to you by your health care provider. Make sure you discuss any questions you have with your health care provider. °  °Document Released: 04/15/2004 Document Revised: 03/29/2014 Document Reviewed: 06/15/2012 °Elsevier Interactive Patient Education ©2016 Elsevier Inc. ° °Emergency Department Resource Guide °1) Find a Doctor and Pay Out of Pocket °Although you won't have to find out who is covered by your insurance plan, it is a good idea to ask around and get  recommendations. You will then need to call the office and see if the doctor you have chosen will accept you as a new patient and what types of options they offer for patients who are self-pay. Some doctors offer discounts or will set up payment plans for their patients who do not have insurance, but you will need to ask so you aren't surprised when you get to your appointment. ° °2) Contact Your Local Health Department °Not all health departments have doctors that can see patients for sick visits, but many do, so it is worth a call to see if yours does. If you don't know where your local health department is, you can check in your phone book. The CDC also has a tool to help you locate your state's health department, and many state websites also have listings of all of their local health departments. ° °3) Find a Walk-in Clinic °If your illness is not likely to be very severe or complicated, you may want to try a walk in clinic. These are popping up all over the country in pharmacies, drugstores, and shopping centers. They're usually staffed by nurse practitioners or physician assistants that have been trained to treat common illnesses and complaints. They're usually fairly quick and inexpensive. However, if you have serious medical issues or chronic medical problems, these are probably not your best option. ° °No Primary Care Doctor: °- Call [facility] at  832-8000 - they can help you locate a primary care doctor that  accepts your insurance, provides certain services, etc. °- Physician Referral Service- 1-800-533-3463 ° °Chronic Pain Problems: °Organization           Address  Phone   Notes  °[facility]  (336) 297-2271 Patients need to be referred by their primary care doctor.  ° °Medication Assistance: °Organization         Address  Phone   Notes  °[place] [facility] [address] °[place], [place] [address] (336) 641-8030 --Must be a resident of  [place] °-- Must have NO insurance coverage whatsoever (no Medicaid/ Medicare, etc.) °-- The pt. MUST have a primary care doctor that directs their care regularly and follows them in the community °  °[facility]  (866) 331-1348   °[employer]  (888) 892-1162   ° °Agencies that provide inexpensive medical care: °Organization         Address  Phone   Notes  °[facility]  (336) 832-8035   °[facility]    (336) 832-7272   °[facility] [address] °[place], [place] [address] (336) 832-4777   °[facility] [address], °[place] (336) 271-4999   °[facility]    (336) 373-0678   °[facility]    (336) 272-1050   °[facility] ° [address], [place] Phone:  (336) 832-4444, Fax:  (336) 832-4440 Hours of Operation:  9 am - 6 pm, M-F.  Also accepts Medicaid/Medicare and self-pay.  °[facility] ° [address], [place] Phone: (336) 832-3150, Fax: (336) 832-3151. Hours of Operation:  8:30 am - 5:30 pm, M-F.  Also accepts Medicaid and self-pay.  °[facility] [place] [address], [place] Phone: (336) 878-6027   °[facility] [address], [place], [place] (336)723-1848, Ext. 123 Mondays & Thursdays: 7-9 AM.  First 15 patients are seen on a first come, first serve basis. °  ° °Medicaid-accepting [place] Providers: ° °Organization         Address  Phone   Notes  °[facility] [address], [place] (336) 641-2100 Also accepts self-pay patients.  °[facility] [address], [place] ° (336) 856-9996   °[facility] [address], [place] (336) 288-8857   °[facility] [address] [place] [address], [place] (336) 299-7000   °Veita Bland [address], [place]  ° (336) 373-1557 Only accepts [place] Access  Medicaid patients after they have their name applied to their card.  ° °Self-Pay (no insurance) in [place]: ° °Organization         Address  Phone   Notes  °Sickle Cell Patients, [facility] [address], [place] (336) 832-1970   °[facility] [address], [place] (336) 832-4400   °[facility] ° [address], [place] (336) 992-4800   °[facility]/Dr. Osei-Bonsu ° [address] [place] [address], [place] or [address], [place] (336) 841-8500 Phone number for both [place] and [place] locations is the same.  °[facility] [address], [place] (336) 299-0000   °[facility] [address] [place] [address], [place] or [address] (336) 852-7530 °(336) 878-2260   °[facility] [address],  (336) 350-1642, phone; (336) 294-5005, fax Sees patients 1st and 3rd Saturday of every month.  Must not qualify   for public or private insurance (i.e. Medicaid, Medicare, [place] Health Choice, Veterans' Benefits) • Household income should be no more than 200% of the poverty level •The clinic cannot treat you if you are pregnant or think you are pregnant • Sexually transmitted diseases are not treated at the clinic.  ° ° °Dental Care: °Organization         Address  Phone  Notes  °[facility] [facility] [address], [place] (336) 641-6152 Accepts children up to age 21 who are enrolled in Medicaid or [place] Health Choice; pregnant women with a Medicaid card; and children who have applied for Medicaid or [place] Health Choice, but were declined, whose parents can pay a reduced fee at time of service.  °[facility] [place]  [address], [place] (336) 641-7733 Accepts children up to age 21 who are enrolled in Medicaid or [place] Health Choice; pregnant women with a Medicaid  card; and children who have applied for Medicaid or [place] Health Choice, but were declined, whose parents can pay a reduced fee at time of service.  °[facility] ° [address], [place] (336) 641-4533 Patients are seen by appointment only. Walk-ins are not accepted. [facility] will see patients 18 years of age and older. °Monday - Tuesday (8am-5pm) °Most Wednesdays (8:30-5pm) °$30 per visit, cash only  °[facility] ° [address], [place] (336) 641-4533 Patients are seen by appointment only. Walk-ins are not accepted. [facility] will see patients 18 years of age and older. °One Wednesday Evening (Monthly: Volunteer Based).  $30 per visit, cash only  °[facility]  (919) 537-3737 for adults; Children under age 4, call Graduate Pediatric Dentistry at ([phone]) [phone]. Children aged 4-14, please call (919) [phone] to request a pediatric application. ° Dental services are provided in all areas of dental care including fillings, crowns and bridges, complete and partial dentures, implants, gum treatment, root canals, and extractions. Preventive care is also provided. Treatment is provided to both adults and children. °Patients are selected via a lottery and there is often a waiting list. °  °[facility] [address], °[place] ° (336) 763-8833 [email or web address] °  °[facility] [address], [place], [place] (336)723-1848, Ext. 123 Second and Fourth Thursday of each month, opens at 6:30 AM; Clinic ends at 9 AM.  Patients are seen on a first-come first-served basis, and a limited number are seen during each clinic.  ° °[facility] ° [address], [place], [place] (336) 723-7904   Eligibility Requirements °You must have lived in [place], or [place] counties for at least the last three months. °  You cannot be eligible for state or federal sponsored healthcare insurance,  including Veterans Administration, Medicaid, or Medicare. °  You generally cannot be eligible for healthcare insurance through your employer.  °  How to apply: °Eligibility screenings are held every Tuesday and Wednesday afternoon from 1:00 pm until 4:00 pm. You do not need an appointment for the interview!  °[facility] [address], [place], [place] 336-631-2330   °[facility]  336-342-8273   °[facility]  336-703-3100   °[facility]  336-570-6415   ° °Behavioral Health Resources in the Community: °Intensive Outpatient Programs °Organization         Address  Phone    Notes  °[facility] [address], [place], [place] 336-878-6098   °[facility] [address], [place], [place] 336-832-9800   °[facility] [address], [place], [place] ° 336-882-2125   °[facility] [address],  °[place], [place] 1-800-853-5163 or 336-641-4981   °Substance Abuse Resources °Organization         Address  Phone  Notes  °[facility]  336-882-2125   °[facility]  336-784-9470   °[facility]  336-285-9073   °[facility]  336-845-3988   °Residential & Outpatient Substance Abuse Program  1-800-659-3381   °Psychological Services °Organization         Address  Phone  Notes  °[facility]  336- 832-9600   °[facility]  336- 378-7881   °[facility] [address], [name] 1-800-853-5163 or 336-641-4981   ° °Mobile Crisis Teams °Organization         Address  Phone  Notes  °[facility], Mobile Crisis Care Unit  1-877-626-1772   °Assertive °[facility] ° [address] [place], [place] 336-834-9664   °Sharon DeEsch [address] °[place] 336-554-5454   ° °Self-Help/Support Groups °Organization         Address  Phone             Notes  °[facility] [place] - variety of support groups  336- 373-1402 Call for more information  °Narcotics Anonymous (NA), [facility] [address], °[place]  2 meetings at this location  ° °Residential Treatment Programs °Organization         Address  Phone  Notes  °[facility] [address],    °[place]  1-866-801-8205   °[facility] ° [address], [place], [place] 704-293-8524   °[facility] [address], [place] 336-845-3988 Admissions: 8am-3pm M-F  °[facility] [address]    °[place], [place] 336-841-1104   °[facility] [address], [place], [place] 336-379-7146   °[facility] [address]  °, [place] 336-285-9073   °[facility] - Intensive Outpatient [address], , [place] 336-852-3033   °[facility] [address]  °[place], [place] 1-877-615-2722 or 336-784-9470   °[facility] [address] [place], [place] 336-227-7417 Accepts Medicaid  °[facility] [address]  ° [place] 1-800-659-3381 Substance Abuse/Addiction Treatment  ° °[place] Behavioral Health Resources °Organization         Address  Phone  Notes  °[facility]  (888) 581-9988   °Julie Brannon, PhD [address] [place], [place]   (336) 349-5553 or (336) 951-0000   °[facility]   [address] °[place], [place] (336) 349-4454   °[facility] [address], [place], [place] (336) 342-8316 Insurance/Medicaid/sponsorship through [facility]  °[facility] [address]                                    [place], [place] (336) 342-8316 Therapy/tele-psych/case  °[facility] [address]  ° [place],  (336) 349-2233    °Dr. Arfeen  (336) 349-4544   °[facility] [place]  [employer] [place]   [facility] 1) [address], [place] °2)  [address], [place] °3)  [address], [place] (336) 349-3220 °(336) 342-7768 ° °(336) 342-8140   °[place] [facility] (336) 342-1394 or (336) 342-3537 (After Hours)    ° ° °

## 2015-02-28 ENCOUNTER — Emergency Department (HOSPITAL_COMMUNITY)
Admission: EM | Admit: 2015-02-28 | Discharge: 2015-03-01 | Disposition: A | Discharge status: Home / Self Care | Payer: Medicaid Other | Attending: Emergency Medicine | Admitting: Emergency Medicine

## 2015-02-28 ENCOUNTER — Encounter (HOSPITAL_COMMUNITY): Payer: Self-pay

## 2015-02-28 DIAGNOSIS — F319 Bipolar disorder, unspecified: Secondary | ICD-10-CM | POA: Insufficient documentation

## 2015-02-28 DIAGNOSIS — Z043 Encounter for examination and observation following other accident: Secondary | ICD-10-CM | POA: Insufficient documentation

## 2015-02-28 DIAGNOSIS — Z7952 Long term (current) use of systemic steroids: Secondary | ICD-10-CM | POA: Insufficient documentation

## 2015-02-28 DIAGNOSIS — F10129 Alcohol abuse with intoxication, unspecified: Secondary | ICD-10-CM | POA: Insufficient documentation

## 2015-02-28 DIAGNOSIS — Z8719 Personal history of other diseases of the digestive system: Secondary | ICD-10-CM | POA: Diagnosis not present

## 2015-02-28 DIAGNOSIS — W1839XA Other fall on same level, initial encounter: Secondary | ICD-10-CM | POA: Insufficient documentation

## 2015-02-28 DIAGNOSIS — Y9389 Activity, other specified: Secondary | ICD-10-CM | POA: Diagnosis not present

## 2015-02-28 DIAGNOSIS — Z79899 Other long term (current) drug therapy: Secondary | ICD-10-CM | POA: Diagnosis not present

## 2015-02-28 DIAGNOSIS — Y9289 Other specified places as the place of occurrence of the external cause: Secondary | ICD-10-CM | POA: Diagnosis not present

## 2015-02-28 DIAGNOSIS — Y998 Other external cause status: Secondary | ICD-10-CM | POA: Insufficient documentation

## 2015-02-28 DIAGNOSIS — IMO0002 Reserved for concepts with insufficient information to code with codable children: Secondary | ICD-10-CM

## 2015-02-28 NOTE — ED Notes (Signed)
Patient reported that he did fall and hit his head on concrete. Patient denied having a headache. Patient could not point to where he hit his head and told writer to leave him the f--- alone. Patient stated, "I just want to go to sleep."

## 2015-02-28 NOTE — ED Provider Notes (Signed)
CSN: [id number]     Arrival date & time 02/28/15  1423 History   First MD Initiated Contact with Patient 02/28/15 1506     Chief Complaint  Patient presents with  . Fall  . detox    HPI   27 YOM presents today via ems status post fall. EMS reports they were called out for an fall and found patient intoxicated. Pt was verbally abusive towards ems. They deny and signs of injury or trauma. At the time of my initial evaluation patient was resting comfortably. Pt has been seen in the ED 12 times in the last 16 months for similar presentations.  Past Medical History  Diagnosis Date  . Back pain   . Neck pain   . Bipolar 1 disorder (HCC)   . ETOH abuse   . Withdrawal seizures (HCC)   . Pancreatitis    History reviewed. No pertinent past surgical history. Family History  Problem Relation Age of Onset  . Hypertension Mother    Social History  Substance Use Topics  . Smoking status: Never Smoker   . Smokeless tobacco: Never Used  . Alcohol Use: Yes     Comment: Last drink: Yesterday    Review of Systems  All other systems reviewed and are negative.  Allergies  Haldol; Thorazine ; Hydrocodone; Lithium; and Seroquel  Home Medications   Prior to Admission medications   Medication Sig Start Date End Date Taking? Authorizing Provider  chlordiazePOXIDE (LIBRIUM) 25 MG capsule  PO TID x 1D, then 25-50mg  PO BID X 1D, then 25-50mg  PO QD X 1D 01/08/15   Shari Upstill, PA-C  clonazePAM (KLONOPIN) 0.5 MG tablet Take 0.5 mg by mouth 3 (three) times daily.    Historical Provider, MD  cyclobenzaprine (FLEXERIL) 10 MG tablet Take 1 tablet (10 mg total) by mouth 2 (two) times daily as needed for muscle spasms. 01/27/15   Samantha Tripp Dowless, PA-C  ibuprofen (ADVIL,MOTRIN) 200 MG tablet Take 800-1,000 mg by mouth every 6 (six) hours as needed.     Historical Provider, MD  lisdexamfetamine (VYVANSE) 50 MG capsule Take 50 mg by mouth daily.    Historical Provider, MD  lurasidone (LATUDA) 80  MG TABS tablet Take 80 mg by mouth daily with breakfast.    Historical Provider, MD  Multiple Vitamin (MULTIVITAMIN WITH MINERALS) TABS tablet Take 1 tablet by mouth daily.    Historical Provider, MD  predniSONE (DELTASONE) 20 MG tablet Take 2 tablets (40 mg total) by mouth daily. 01/28/15   Tatyana Kirichenko, PA-C  tamsulosin (FLOMAX) 0.4 MG CAPS capsule Take 0.4 mg by mouth daily. 12/16/14   Historical Provider, MD  traMADol (ULTRAM) 50 MG tablet Take 1 tablet (50 mg total) by mouth every 6 (six) hours as needed. Patient not taking: Reported on 12/31/2014 12/02/14   Raeford Razor, MD  traZODone (DESYREL) 50 MG tablet Take 100 mg by mouth at bedtime. 12/15/14   Historical Provider, MD   BP 90/55 mmHg  Pulse 85  Temp(Src) 98 F (36.7 C)  Resp 16  SpO2 95%    Physical Exam  Constitutional: He is oriented to person, place, and time. He appears well-developed and well-nourished.  HENT:  Head: Normocephalic and atraumatic.  Head atraumatic, nontender to palpation  Eyes: Conjunctivae are normal. Pupils are equal, round, and reactive to light. Right eye exhibits no discharge. Left eye exhibits no discharge. No scleral icterus.  Neck: Normal range of motion. No JVD present. No tracheal deviation present.  Cardiovascular: Normal rate,  regular rhythm, normal heart sounds and intact distal pulses.  Exam reveals no gallop and no friction rub.   No murmur heard. Pulmonary/Chest: Effort normal. No stridor.  Abdominal: Soft. He exhibits no distension and no mass. There is no tenderness. There is no rebound and no guarding.  Musculoskeletal: Normal range of motion. He exhibits no edema or tenderness.  Neurological: He is alert and oriented to person, place, and time. Coordination normal.  Skin: Skin is warm and dry. No rash noted. No erythema. No pallor.  Psychiatric: He has a normal mood and affect. His behavior is normal. Judgment and thought content normal.  Nursing note and vitals reviewed.   ED  Course  Procedures (including critical care time) Labs Review Labs Reviewed - No data to display  Imaging Review No results found. I have personally reviewed and evaluated these images and lab results as part of my medical decision-making.   EKG Interpretation None      MDM   Final diagnoses:  Intoxication    Labs:  Imaging:  Consults:  Therapeutics:  Discharge Meds:   Assessment/Plan: Pt presents for the 12th time in the last 6 months for similar presentation. At the time of my initial evaluation patient was resting comfortably in no acute distress. Inspection of head showed no signs of trauma. Pt will be allowed to rest and will re-eval shortly. Nursing notes that patient denied headache and showed no signs of head injury.  Patient was reevaluated, he had no complaints, denied any head pain, trauma to the head, neck pain, back pain, chest pain shortness of breath, abdominal pain, or any other significant signs or symptoms. Patient reports that he was drinking alcohol earlier today, uncertain as to how much she drank. Patient will be monitored here in the ED until clinically sober and discharged home. [facility]         Eyvonne MechanicJeffrey Natsha Guidry, PA-C 03/01/15 0132  Marily MemosJason Mesner, MD 03/01/15 [phone]

## 2015-02-28 NOTE — ED Notes (Signed)
Per EMS- patient was [place] a shopping center and a bystander saw the patient fall and called 911. Patient is intoxicated. Patient states he drank a gallon of Vodka today. Patient is also requesting detox.

## 2015-02-28 NOTE — ED Notes (Signed)
Pt unable to ambulate at this time even with staff assistance.  Also unable to balance self while standing.

## 2015-02-28 NOTE — Discharge Instructions (Signed)
Please read attached information

## 2015-03-01 NOTE — ED Provider Notes (Signed)
[id number] - Patient care assumed from Burna FortsJeff Hedges, PA-C with plan to d/c when ambulatory. Patient has been sleeping most of the time in the ED in no distress. He is now ambulatory to the bathroom and back to his bed without difficulty. Will d/c at this time; VSS. Return precautions given.   Filed Vitals:   02/28/15 2303 03/01/15 0001 03/01/15 0106 03/01/15 0406  BP: 113/66 106/61 127/71 112/59  Pulse: 101 83 84 82  Temp:      Resp: 18 18 18 18   SpO2: 97% 96% 95% 98%     Antony MaduraKelly Allessandra Bernardi, PA-C 03/01/15 0526  Dione Boozeavid Glick, MD 03/01/15 [phone]

## 2015-03-01 NOTE — ED Notes (Addendum)
Pt. Ambulated to the bathroom and back to his bed without assistance and without difficulty. Pt. Gait steady on his feet.

## 2015-06-09 ENCOUNTER — Encounter (HOSPITAL_COMMUNITY): Payer: Self-pay

## 2015-06-09 ENCOUNTER — Emergency Department (HOSPITAL_BASED_OUTPATIENT_CLINIC_OR_DEPARTMENT_OTHER)
Admission: EM | Admit: 2015-06-09 | Discharge: 2015-06-09 | Disposition: A | Discharge status: Home / Self Care | Payer: Medicaid Other | Attending: Emergency Medicine | Admitting: Emergency Medicine

## 2015-06-09 ENCOUNTER — Encounter (HOSPITAL_BASED_OUTPATIENT_CLINIC_OR_DEPARTMENT_OTHER): Payer: Self-pay | Admitting: Emergency Medicine

## 2015-06-09 ENCOUNTER — Emergency Department (HOSPITAL_COMMUNITY)
Admission: EM | Admit: 2015-06-09 | Discharge: 2015-06-09 | Disposition: A | Discharge status: Home / Self Care | Payer: Medicaid Other | Attending: Emergency Medicine | Admitting: Emergency Medicine

## 2015-06-09 ENCOUNTER — Emergency Department (HOSPITAL_BASED_OUTPATIENT_CLINIC_OR_DEPARTMENT_OTHER): Payer: Medicaid Other

## 2015-06-09 ENCOUNTER — Emergency Department (HOSPITAL_COMMUNITY): Payer: Medicaid Other

## 2015-06-09 DIAGNOSIS — F10129 Alcohol abuse with intoxication, unspecified: Secondary | ICD-10-CM | POA: Insufficient documentation

## 2015-06-09 DIAGNOSIS — Y9289 Other specified places as the place of occurrence of the external cause: Secondary | ICD-10-CM | POA: Diagnosis not present

## 2015-06-09 DIAGNOSIS — R112 Nausea with vomiting, unspecified: Secondary | ICD-10-CM | POA: Insufficient documentation

## 2015-06-09 DIAGNOSIS — IMO0002 Reserved for concepts with insufficient information to code with codable children: Secondary | ICD-10-CM

## 2015-06-09 DIAGNOSIS — R079 Chest pain, unspecified: Secondary | ICD-10-CM | POA: Insufficient documentation

## 2015-06-09 DIAGNOSIS — Y9389 Activity, other specified: Secondary | ICD-10-CM | POA: Diagnosis not present

## 2015-06-09 DIAGNOSIS — R0602 Shortness of breath: Secondary | ICD-10-CM | POA: Diagnosis not present

## 2015-06-09 DIAGNOSIS — Z8719 Personal history of other diseases of the digestive system: Secondary | ICD-10-CM | POA: Insufficient documentation

## 2015-06-09 DIAGNOSIS — W19XXXA Unspecified fall, initial encounter: Secondary | ICD-10-CM

## 2015-06-09 DIAGNOSIS — W01198A Fall on same level from slipping, tripping and stumbling with subsequent striking against other object, initial encounter: Secondary | ICD-10-CM | POA: Diagnosis not present

## 2015-06-09 DIAGNOSIS — S0990XA Unspecified injury of head, initial encounter: Secondary | ICD-10-CM | POA: Diagnosis not present

## 2015-06-09 DIAGNOSIS — Y998 Other external cause status: Secondary | ICD-10-CM | POA: Insufficient documentation

## 2015-06-09 DIAGNOSIS — F319 Bipolar disorder, unspecified: Secondary | ICD-10-CM | POA: Diagnosis not present

## 2015-06-09 LAB — COMPREHENSIVE METABOLIC PANEL
ALT: 26 U/L (ref 17–63)
AST: 45 U/L — ABNORMAL HIGH (ref 15–41)
Albumin: 3.7 g/dL (ref 3.5–5.0)
Alkaline Phosphatase: 88 U/L (ref 38–126)
Anion gap: 15 (ref 5–15)
BILIRUBIN TOTAL: 0.6 mg/dL (ref 0.3–1.2)
BUN: 11 mg/dL (ref 6–20)
CHLORIDE: 108 mmol/L (ref 101–111)
CO2: 23 mmol/L (ref 22–32)
CREATININE: 0.67 mg/dL (ref 0.61–1.24)
Calcium: 8.3 mg/dL — ABNORMAL LOW (ref 8.9–10.3)
GFR calc Af Amer: >60 mL/min (ref >60)
Glucose, Bld: 120 mg/dL — ABNORMAL HIGH (ref 65–99)
Potassium: 3.7 mmol/L (ref 3.5–5.1)
Sodium: 146 mmol/L — ABNORMAL HIGH (ref 135–145)
TOTAL PROTEIN: 7.1 g/dL (ref 6.5–8.1)

## 2015-06-09 LAB — I-STAT TROPONIN, ED
TROPONIN I, POC: 0 ng/mL (ref 0.00–0.08)
Troponin i, poc: 0 ng/mL (ref 0.00–0.08)

## 2015-06-09 LAB — CBC
HEMATOCRIT: 38.8 % — AB (ref 39.0–52.0)
HEMOGLOBIN: 13.2 g/dL (ref 13.0–17.0)
MCH: 31.2 pg (ref 26.0–34.0)
MCHC: 34 g/dL (ref 30.0–36.0)
MCV: 91.7 fL (ref 78.0–100.0)
Platelets: 202 10*3/uL (ref 150–400)
RBC: 4.23 MIL/uL (ref 4.22–5.81)
RDW: 14.7 % (ref 11.5–15.5)
WBC: 5.2 10*3/uL (ref 4.0–10.5)

## 2015-06-09 LAB — URINALYSIS, ROUTINE W REFLEX MICROSCOPIC
Bilirubin Urine: NEGATIVE
GLUCOSE, UA: NEGATIVE mg/dL
Hgb urine dipstick: NEGATIVE
KETONES UR: 15 mg/dL — AB
LEUKOCYTES UA: NEGATIVE
Nitrite: NEGATIVE
PH: 5 (ref 5.0–8.0)
Protein, ur: NEGATIVE mg/dL
SPECIFIC GRAVITY, URINE: 1.023 (ref 1.005–1.030)

## 2015-06-09 LAB — LIPASE, BLOOD: Lipase: 27 U/L (ref 11–51)

## 2015-06-09 NOTE — ED Provider Notes (Signed)
CSN: [id number]     Arrival date & time 06/09/15  0152 History  By signing my name below, I, Craig [name], attest that this documentation has been prepared under the direction and in the presence of Craig Rhine, MD. Electronically Signed: Bethel [name], ED Scribe. 06/09/2015. 3:02 AM   Chief Complaint  Patient presents with  . Chest Pain  . Alcohol Intoxication   Patient is a 55 y.o. male presenting with chest pain. The history is provided by the patient. No language interpreter was used.  Chest Pain Pain location:  Substernal area Pain radiates to:  Does not radiate Pain radiates to the back: no   Pain severity:  Moderate Onset quality:  Sudden Timing:  Constant Progression:  Improving Chronicity:  Recurrent Context: movement   Context comment:  Walking Relieved by:  Nothing Worsened by:  Nothing tried Ineffective treatments:  None tried Associated symptoms: cough, diaphoresis, nausea, shortness of breath and vomiting   Associated symptoms: no abdominal pain, no fever, no headache, no lower extremity edema and no syncope   Risk factors: male sex   Risk factors: no coronary artery disease, no diabetes mellitus, no high cholesterol, no hypertension, no immobilization, not obese, not pregnant, no prior DVT/PE and no smoking    [name] is a 55 y.o. male with history of pancreatitis, chronic pain, alcohol abuse, and withdrawal seizures who presents to the Emergency Department complaining of recurrent, constant, 7/10 in severity, mid chest pain with onset last night while walking. The pain has improved since initial onset. Associated symptoms include 2 episodes of emesis, SOB, cough, and sweating. Pt denies fever, abdominal pain, LE swelling, and syncope. No history of MI or stroke. He does not smoke. He uses alcohol 2-3 times per week with his last drink being yesterday near 5 PM.  Past Medical History  Diagnosis Date  . Back pain   . Neck pain   . Bipolar 1 disorder  (HCC)   . ETOH abuse   . Withdrawal seizures (HCC)   . Pancreatitis    History reviewed. No pertinent past surgical history. Family History  Problem Relation Age of Onset  . Hypertension Mother    Social History  Substance Use Topics  . Smoking status: Never Smoker   . Smokeless tobacco: Never Used  . Alcohol Use: Yes     Comment: Last drink: Yesterday    Review of Systems  Constitutional: Positive for diaphoresis. Negative for fever.  Respiratory: Positive for cough and shortness of breath.   Cardiovascular: Positive for chest pain. Negative for syncope.  Gastrointestinal: Positive for nausea and vomiting. Negative for abdominal pain.  Neurological: Negative for headaches.  All other systems reviewed and are negative.  Allergies  Haldol; Thorazine ; Hydrocodone; Seroquel; and Lithium  Home Medications   Prior to Admission medications   Not on File   BP 130/83 mmHg  Pulse 112  Temp(Src) 98.4 F (36.9 C) (Oral)  Resp 20  Ht  (1.854 m)  Wt 225 lb (102.059 kg)  BMI 29.69 kg/m2  SpO2 99% Physical Exam CONSTITUTIONAL: Disheveled, anxious HEAD: Normocephalic/atraumatic EYES: EOMI/PERRL ENMT: Mucous membranes moist NECK: supple no meningeal signs SPINE/BACK:entire spine nontender CV: S1/S2 noted, no murmurs/rubs/gallops noted LUNGS: Lungs are clear to auscultation bilaterally, no apparent distress Chest - tenderness to left lower chest, no bruising or crepitus ABDOMEN: soft, nontender, no rebound or guarding, bowel sounds noted throughout abdomen GU:no cva tenderness NEURO: Pt is awake/alert/appropriate, moves all extremitiesx4.  No facial droop.  EXTREMITIES: pulses normal/equal, full ROM, no LE edema or tenderness SKIN: warm, color normal PSYCH: Anxious and crying  ED Course  Procedures  DIAGNOSTIC STUDIES: Oxygen Saturation is 99% on RA,  normal by my interpretation.    COORDINATION OF CARE: 3:01 AM Discussed treatment plan which includes lab work,  CXR, EKG with pt at bedside and pt agreed to plan.  7:50 AM Pt monitored for several hours On repeat evaluation, he reported both sharp and pressure like CP He seemed to have strong anxiety component and became tearful/anxious during history taking He has some reproducible CP during exam HEART score = 3 with two negative troponins and no acute EKG changes He slept through part of ED stay.  He had  brief episodes of hypoxia (91%) which I suspect occurred during sleeping/snoring as when I speak to him he has no hypoxia His heart rate elevates when I speak to him and he is anxious I have low suspicion for PE I doubt Dissection at this time He denies h/o CAD/PE/DVT When asked about recent visit to [facility] (pt in their ED on 3/19) he reports he was there for other reasons and does admit to ETOH use Pt had negative CT chest in February 2017 at [facility] Clinically stable at this time No strong signs of ETOH withdrawal I feel he is appropriate for d/c home Given return precautions and f/u info for cardiology Labs Review Labs Reviewed  CBC - Abnormal; Notable for the following:    HCT 38.8 (*)    All other components within normal limits  COMPREHENSIVE METABOLIC PANEL - Abnormal; Notable for the following:    Sodium 146 (*)    Glucose, Bld 120 (*)    Calcium 8.3 (*)    AST 45 (*)    All other components within normal limits  URINALYSIS, ROUTINE W REFLEX MICROSCOPIC (NOT AT [facility]) - Abnormal; Notable for the following:    Ketones, ur 15 (*)    All other components within normal limits  LIPASE, BLOOD  I-STAT TROPOININ, ED  I-STAT TROPOININ, ED    Imaging Review Dg Chest 2 View  06/09/2015  CLINICAL DATA:  Recurrent chest pain after being discharged recently. EXAM: CHEST  2 VIEW COMPARISON:  04/27/2015 FINDINGS: There is unchanged left hemidiaphragm elevation. Shallow inspiration accentuates the basilar markings. The lungs are clear. There is no effusion. The pulmonary vasculature is  normal. Heart size is normal and unchanged. IMPRESSION: Shallow inspiration.  No acute cardiopulmonary findings. Electronically Signed   By: [name] M.D.   On: 06/09/2015 02:52   I have personally reviewed and evaluated these images and lab results as part of my medical decision-making.   EKG Interpretation   Date/Time:  Monday June 09 2015 02:11:02 EDT Ventricular Rate:  108 PR Interval:  162 QRS Duration: 114 QT Interval:  353 QTC Calculation: 473 R Axis:   49 Text Interpretation:  Sinus tachycardia Incomplete right bundle branch  block No significant change since last tracing Confirmed by Bebe Shaggy  MD,  Mahogany Torrance ([id number]) on 06/09/2015 2:20:33 AM      EKG Interpretation  Date/Time:  Monday June 09 2015 07:13:38 EDT Ventricular Rate:  102 PR Interval:  168 QRS Duration: 120 QT Interval:  385 QTC Calculation: 501 R Axis:   37 Text Interpretation:  Sinus tachycardia IVCD, consider atypical RBBB Baseline wander in lead(s) V1 V3 No significant change since last tracing Confirmed by Bebe Shaggy  MD, Tito Ausmus ([id number]) on 06/09/2015 7:26:30 AM Also confirmed by  Bebe ShaggyWICKLINE  MD, Tod Abrahamsen ([id number]), editor [place]  CCT, BEVERLY ([id number])  on 06/09/2015 7:27:47 AM       MDM   Final diagnoses:  Chest pain, unspecified chest pain type    Nursing notes including past medical history and social history reviewed and considered in documentation xrays/imaging reviewed by myself and considered during evaluation Labs/vital reviewed myself and considered during evaluation Previous records reviewed and considered    I personally performed the services described in this documentation, which was scribed in my presence. The recorded information has been reviewed and is accurate.       Craig Rhineonald Carnelius Hammitt, MD 06/09/15 [phone]

## 2015-06-09 NOTE — ED Notes (Signed)
Patient transported to CT 

## 2015-06-09 NOTE — ED Provider Notes (Addendum)
CSN: [id number]     Arrival date & time 06/09/15  1437 History  By signing my name below, I, Budd [name], attest that this documentation has been prepared under the direction and in the presence of Nelva Nay, MD. Electronically Signed: Budd [name], ED Scribe. 06/09/2015. 3:31 PM.      Chief Complaint  Patient presents with  . Head Injury   The history is provided by the patient. No language interpreter was used.   HPI Comments:  (Level 5 Caveat: Mental Status Change due to EtOH) Craig [name] is a 55 y.o. male with a PMHX of EtOH abuse, back pain, neck pain, and pancreatitis brought in by ambulance, who presents to the Emergency Department complaining of an injury to the forehead sustained just PTA. Pt states he had been drinking and fell, striking his forehead. He notes he drinks wine, with his most recent intake this morning. He notes he is currently unable to recall where he lives.   Past Medical History  Diagnosis Date  . Back pain   . Neck pain   . Bipolar 1 disorder (HCC)   . ETOH abuse   . Withdrawal seizures (HCC)   . Pancreatitis    History reviewed. No pertinent past surgical history. Family History  Problem Relation Age of Onset  . Hypertension Mother    Social History  Substance Use Topics  . Smoking status: Never Smoker   . Smokeless tobacco: Never Used  . Alcohol Use: Yes     Comment: Last drink: Yesterday    Review of Systems  Unable to perform ROS: Mental status change    Allergies  Haldol; Thorazine ; Hydrocodone; Seroquel; and Lithium  Home Medications   Prior to Admission medications   Not on File   BP 143/104 mmHg  Pulse 114  Temp(Src) 98.3 F (36.8 C) (Oral)  Resp 20  SpO2 99% Physical Exam  Constitutional: He is oriented to person, place, and time. He appears well-developed and well-nourished. No distress.  HENT:  Head: Normocephalic and atraumatic.  Eyes: Pupils are equal, round, and reactive to light.  Neck: Normal range of  motion.  Cardiovascular: Normal rate and intact distal pulses.   Pulmonary/Chest: No respiratory distress.  Abdominal: Normal appearance. He exhibits no distension.  Musculoskeletal: Normal range of motion.  Neurological: He is alert and oriented to person, place, and time. No cranial nerve deficit.  Skin: Skin is warm and dry. No rash noted.  Psychiatric: His affect is blunt. His speech is slurred.  Patient appears intoxicated  Nursing note and vitals reviewed.   ED Course  Procedures  DIAGNOSTIC STUDIES: Oxygen Saturation is 97% on RA, adequate by my interpretation.    COORDINATION OF CARE: 3:27 PM - Discussed plans to order a head CT. Pt advised of plan for treatment and pt agrees.  Labs Review Labs Reviewed - No data to display  Imaging Review Dg Chest 2 View  06/09/2015  CLINICAL DATA:  Recurrent chest pain after being discharged recently. EXAM: CHEST  2 VIEW COMPARISON:  04/27/2015 FINDINGS: There is unchanged left hemidiaphragm elevation. Shallow inspiration accentuates the basilar markings. The lungs are clear. There is no effusion. The pulmonary vasculature is normal. Heart size is normal and unchanged. IMPRESSION: Shallow inspiration.  No acute cardiopulmonary findings. Electronically Signed   By: [name] M.D.   On: 06/09/2015 02:52   Ct Head Wo Contrast  06/09/2015  CLINICAL DATA:  Status post fall 4 days ago.  Headache. EXAM: CT HEAD  WITHOUT CONTRAST TECHNIQUE: Contiguous axial images were obtained from the base of the skull through the vertex without intravenous contrast. COMPARISON:  Head CT scan 06/08/2015 and 03/05/2015. FINDINGS: Generalized atrophy is again seen. No evidence of acute intracranial abnormality including hemorrhage, infarct, mass lesion, mass effect, midline shift or abnormal extra-axial fluid collection is identified. There is no hydrocephalus or pneumocephalus. The calvarium is intact. IMPRESSION: No acute abnormality. Atrophy. Electronically  Signed   By: Drusilla Kannerhomas  Dalessio M.D.   On: 06/09/2015 16:02   I have personally reviewed and evaluated these images and lab results as part of my medical decision-making.  Patient ambulatory with no difficulty.  Appears clinically sober and able to make decisions with insight.  Stable for discharge.  MDM   Final diagnoses:  Intoxication  Fall, initial encounter    I personally performed the services described in this documentation, which was scribed in my presence. The recorded information has been reviewed and considered.     Nelva Nayobert Tabius Rood, MD 06/09/15 2214

## 2015-06-09 NOTE — ED Notes (Signed)
Per ems: The patient reports that he fell and hit his head, The patient is per is "normal" self per ems. The patient has obvious ETOH on board.

## 2015-06-09 NOTE — Discharge Instructions (Signed)
Alcohol Intoxication Alcohol intoxication occurs when the amount of alcohol that a person has consumed impairs his or her ability to mentally and physically function. Alcohol directly impairs the normal chemical activity of the brain. Drinking large amounts of alcohol can lead to changes in mental function and behavior, and it can cause many physical effects that can be harmful.  Alcohol intoxication can range in severity from mild to very severe. Various factors can affect the level of intoxication that occurs, such as the person's age, gender, weight, frequency of alcohol consumption, and the presence of other medical conditions (such as diabetes, seizures, or heart conditions). Dangerous levels of alcohol intoxication may occur when people drink large amounts of alcohol in a short period (binge drinking). Alcohol can also be especially dangerous when combined with certain prescription medicines or "recreational" drugs. SIGNS AND SYMPTOMS Some common signs and symptoms of mild alcohol intoxication include:  Loss of coordination.  Changes in mood and behavior.  Impaired judgment.  Slurred speech. As alcohol intoxication progresses to more severe levels, other signs and symptoms will appear. These may include:  Vomiting.  Confusion and impaired memory.  Slowed breathing.  Seizures.  Loss of consciousness. DIAGNOSIS  Your health care provider will take a medical history and perform a physical exam. You will be asked about the amount and type of alcohol you have consumed. Blood tests will be done to measure the concentration of alcohol in your blood. In many places, your blood alcohol level must be lower than 80 mg/dL (1.61%0.08%) to legally drive. However, many dangerous effects of alcohol can occur at much lower levels.  TREATMENT  People with alcohol intoxication often do not require treatment. Most of the effects of alcohol intoxication are temporary, and they go away as the alcohol naturally  leaves the body. Your health care provider will monitor your condition until you are stable enough to go home. Fluids are sometimes given through an IV access tube to help prevent dehydration.  HOME CARE INSTRUCTIONS  Do not drive after drinking alcohol.  Stay hydrated. Drink enough water and fluids to keep your urine clear or pale yellow. Avoid caffeine.   Only take over-the-counter or prescription medicines as directed by your health care provider.  SEEK MEDICAL CARE IF:   You have persistent vomiting.   You do not feel better after a few days.  You have frequent alcohol intoxication. Your health care provider can help determine if you should see a substance use treatment counselor. SEEK IMMEDIATE MEDICAL CARE IF:   You become shaky or tremble when you try to stop drinking.   You shake uncontrollably (seizure).   You throw up (vomit) blood. This may be bright red or may look like black coffee grounds.   You have blood in your stool. This may be bright red or may appear as a black, tarry, bad smelling stool.   You become lightheaded or faint.  MAKE SURE YOU:   Understand these instructions.  Will watch your condition.  Will get help right away if you are not doing well or get worse.   This information is not intended to replace advice given to you by your health care provider. Make sure you discuss any questions you have with your health care provider.   Document Released: 12/16/2004 Document Revised: 11/08/2012 Document Reviewed: 08/11/2012 Elsevier Interactive Patient Education 2016 [employer]   .Ermelinda Dasreso

## 2015-06-09 NOTE — ED Notes (Signed)
Per EMS pt was discharged from [facility] a few hours ago for same CC; Pt started having recurrent chest pain while walking to the store; Pt has had several occurrence of being discharged or leaving AMA and coming back intoxicated; Pt states pain at 7/10 on arrival; Pt is A&O x 4 on arrival;

## 2015-06-09 NOTE — ED Notes (Signed)
Pt given d/c instructions. Verbalizes understanding. No questions. 

## 2015-06-09 NOTE — Discharge Instructions (Signed)

## 2015-06-09 NOTE — ED Notes (Signed)
Pt resting quietly. Eyes closed. Rise and fall of chest noted. 

## 2015-06-09 NOTE — ED Notes (Signed)
When pt snoring, sats noted dropped to 86%. Pt arouses to spoken name and vigorous tactile stim, when awake sats increase to 100%. Pt goes back to sleep, placed on o2 at 2lpm via [place].

## 2015-06-09 NOTE — ED Notes (Signed)
When asked what happened that brought him here today, pt is teary, states "Please don't be mad at me. I'm just trying to get out of here." pt with slightly slurred speech, strong odor of etoh.

## 2015-06-09 NOTE — ED Notes (Signed)
Patient transported to X-ray 

## 2015-08-24 ENCOUNTER — Emergency Department (HOSPITAL_BASED_OUTPATIENT_CLINIC_OR_DEPARTMENT_OTHER)
Admission: EM | Admit: 2015-08-24 | Discharge: 2015-08-24 | Disposition: A | Discharge status: Home / Self Care | Payer: Medicaid Other | Attending: Emergency Medicine | Admitting: Emergency Medicine

## 2015-08-24 ENCOUNTER — Encounter (HOSPITAL_BASED_OUTPATIENT_CLINIC_OR_DEPARTMENT_OTHER): Payer: Self-pay | Admitting: *Deleted

## 2015-08-24 DIAGNOSIS — F319 Bipolar disorder, unspecified: Secondary | ICD-10-CM | POA: Insufficient documentation

## 2015-08-24 DIAGNOSIS — F10920 Alcohol use, unspecified with intoxication, uncomplicated: Secondary | ICD-10-CM

## 2015-08-24 DIAGNOSIS — F10120 Alcohol abuse with intoxication, uncomplicated: Secondary | ICD-10-CM | POA: Diagnosis present

## 2015-08-24 NOTE — ED Provider Notes (Signed)
I received this patient in signout from Dr.Liu. He had presented intoxicated, brought in by EMS, and we were awaiting pt improvement in mentation. On reexamination, he was sitting up in bed, ambulatory, and able to answer questions appropriately. He admitted to drinking this morning and stated that he had no other complaints. Patient discharged in satisfactory condition.  Laurence Spatesachel Morgan Little, MD 08/24/15 [phone]

## 2015-08-24 NOTE — ED Provider Notes (Addendum)
CSN: [id number]     Arrival date & time 08/24/15  1233 History   First MD Initiated Contact with Patient 08/24/15 1241     Chief Complaint  Patient presents with  . Alcohol Intoxication     (Consider location/radiation/quality/duration/timing/severity/associated sxs/prior Treatment) HPI 55 year old male who presents with alcohol intoxication. He has a history of alcohol abuse, pancreatitis, and alcohol withdrawal seizures. History primarily provided by EMS, who found him sleeping on a bench on the side of the road. Patient is unable to provide any history but does endorse drinking alcohol earlier today. Currently without any complaints. Past Medical History  Diagnosis Date  . Back pain   . Neck pain   . Bipolar 1 disorder (HCC)   . ETOH abuse   . Withdrawal seizures (HCC)   . Pancreatitis    History reviewed. No pertinent past surgical history. Family History  Problem Relation Age of Onset  . Hypertension Mother    Social History  Substance Use Topics  . Smoking status: Never Smoker   . Smokeless tobacco: Never Used  . Alcohol Use: Yes     Comment: Last drink: Yesterday    Review of Systems  Unable to perform ROS: Mental status change      Allergies  Haldol; Thorazine ; Hydrocodone; Seroquel; and Lithium  Home Medications   Prior to Admission medications   Not on File   BP 102/77 mmHg  Pulse 90  Temp(Src) 98.3 F (36.8 C) (Oral)  Resp 20  Ht 6' (1.829 m)  Wt 240 lb (108.863 kg)  BMI 32.54 kg/m2  SpO2 96% Physical Exam Physical Exam  Nursing note and vitals reviewed. Constitutional: Disheveled, appears intoxicated non-toxic, and in no acute distress Head: Normocephalic and atraumatic.  Mouth/Throat: Oropharynx is clear and moist.  Neck: Normal range of motion. Neck supple.  Cardiovascular: Normal rate and regular rhythm.   Pulmonary/Chest: Effort normal and breath sounds normal.  Abdominal: Soft. There is no tenderness. There is no rebound and no  guarding.  Musculoskeletal: Normal range of motion.  Neurological: solmnolent, arouses to voice,  moves all extremities symmetrically and obeys simple commands, slurred and difficult to understand speech. Skin: Skin is warm and dry.  Psychiatric: Cooperative  ED Course  Procedures (including critical care time) Labs Review Labs Reviewed - No data to display  Imaging Review No results found. I have personally reviewed and evaluated these images and lab results as part of my medical decision-making.   EKG Interpretation None      MDM   Final diagnoses:  Alcohol intoxication, uncomplicated (HCC)    55 year old male who presents with alcohol intoxication. He has had multiple prior ED visits for alcohol intoxication. Clinically doesn't appear very intoxicated, and very difficult to understand. POCT glucose normal by EMS. No evidence of trauma. Unremarkable cardiopulmonary exam, but does have some slight hypoxia initially on presentation while sleeping. He subsequently is not wearing his nasal cannula when he is more awake, and has normal oxygenation. Remainder of exam is unremarkable. He does endorse drinking prior to arrival here. At this time we will observe until he is clinically sober. We'll PO challenge, ambulate, and reassess when clinically sober.    Lavera Guiseana Duo Brissa Asante, MD 08/24/15 1608  Lavera Guiseana Duo Shron Ozer, MD 08/24/15 [phone]

## 2015-08-24 NOTE — ED Notes (Signed)
Pt brought by EMS due to intoxication, pass stroke screen per EMS,. Was found [place] bus stop by police, sent here for evaluation. Blood Sugar 156. Pt calling for Tayna and cursing at times. Currently with occasional slurred speech and moving all extriemies well.

## 2015-08-24 NOTE — ED Notes (Signed)
Currently calling [facility] for patient to be sent to via taxi per pt request.

## 2015-08-24 NOTE — Discharge Instructions (Signed)
Return for worsening symptoms, including severe abdominal pain, vomiting and unable to keep down food/fluids or any other symptoms concerning to you.  Alcohol Intoxication Alcohol intoxication occurs when the amount of alcohol that a person has consumed impairs his or her ability to mentally and physically function. Alcohol directly impairs the normal chemical activity of the brain. Drinking large amounts of alcohol can lead to changes in mental function and behavior, and it can cause many physical effects that can be harmful.  Alcohol intoxication can range in severity from mild to very severe. Various factors can affect the level of intoxication that occurs, such as the person's age, gender, weight, frequency of alcohol consumption, and the presence of other medical conditions (such as diabetes, seizures, or heart conditions). Dangerous levels of alcohol intoxication may occur when people drink large amounts of alcohol in a short period (binge drinking). Alcohol can also be especially dangerous when combined with certain prescription medicines or "recreational" drugs. SIGNS AND SYMPTOMS Some common signs and symptoms of mild alcohol intoxication include:  Loss of coordination.  Changes in mood and behavior.  Impaired judgment.  Slurred speech. As alcohol intoxication progresses to more severe levels, other signs and symptoms will appear. These may include:  Vomiting.  Confusion and impaired memory.  Slowed breathing.  Seizures.  Loss of consciousness. DIAGNOSIS  Your health care provider will take a medical history and perform a physical exam. You will be asked about the amount and type of alcohol you have consumed. Blood tests will be done to measure the concentration of alcohol in your blood. In many places, your blood alcohol level must be lower than 80 mg/dL (1.47%0.08%) to legally drive. However, many dangerous effects of alcohol can occur at much lower levels.  TREATMENT  People with  alcohol intoxication often do not require treatment. Most of the effects of alcohol intoxication are temporary, and they go away as the alcohol naturally leaves the body. Your health care provider will monitor your condition until you are stable enough to go home. Fluids are sometimes given through an IV access tube to help prevent dehydration.  HOME CARE INSTRUCTIONS  Do not drive after drinking alcohol.  Stay hydrated. Drink enough water and fluids to keep your urine clear or pale yellow. Avoid caffeine.   Only take over-the-counter or prescription medicines as directed by your health care provider.  SEEK MEDICAL CARE IF:   You have persistent vomiting.   You do not feel better after a few days.  You have frequent alcohol intoxication. Your health care provider can help determine if you should see a substance use treatment counselor. SEEK IMMEDIATE MEDICAL CARE IF:   You become shaky or tremble when you try to stop drinking.   You shake uncontrollably (seizure).   You throw up (vomit) blood. This may be bright red or may look like black coffee grounds.   You have blood in your stool. This may be bright red or may appear as a black, tarry, bad smelling stool.   You become lightheaded or faint.  MAKE SURE YOU:   Understand these instructions.  Will watch your condition.  Will get help right away if you are not doing well or get worse.   This information is not intended to replace advice given to you by your health care provider. Make sure you discuss any questions you have with your health care provider.   Document Released: 12/16/2004 Document Revised: 11/08/2012 Document Reviewed: 08/11/2012 Elsevier Interactive Patient Education 2016 [employer] ° °

## 2015-08-24 NOTE — ED Notes (Signed)
Patient requested to be taken to [facility]. Taxi called for patient and patient verbalized he had all his belongings. Taxi arrived and picked up patient

## 2015-08-28 ENCOUNTER — Encounter (HOSPITAL_COMMUNITY): Payer: Self-pay

## 2015-08-28 ENCOUNTER — Emergency Department (HOSPITAL_COMMUNITY)
Admission: EM | Admit: 2015-08-28 | Discharge: 2015-08-28 | Discharge status: Against Medical Advice | Payer: Medicaid Other | Attending: Emergency Medicine | Admitting: Emergency Medicine

## 2015-08-28 DIAGNOSIS — F10120 Alcohol abuse with intoxication, uncomplicated: Secondary | ICD-10-CM | POA: Diagnosis not present

## 2015-08-28 DIAGNOSIS — R1033 Periumbilical pain: Secondary | ICD-10-CM | POA: Insufficient documentation

## 2015-08-28 DIAGNOSIS — F10920 Alcohol use, unspecified with intoxication, uncomplicated: Secondary | ICD-10-CM

## 2015-08-28 DIAGNOSIS — F319 Bipolar disorder, unspecified: Secondary | ICD-10-CM | POA: Insufficient documentation

## 2015-08-28 DIAGNOSIS — R109 Unspecified abdominal pain: Secondary | ICD-10-CM

## 2015-08-28 LAB — COMPREHENSIVE METABOLIC PANEL
ALT: 46 U/L (ref 17–63)
AST: 57 U/L — ABNORMAL HIGH (ref 15–41)
Albumin: 4.7 g/dL (ref 3.5–5.0)
Alkaline Phosphatase: 102 U/L (ref 38–126)
Anion gap: 13 (ref 5–15)
BUN: 5 mg/dL — ABNORMAL LOW (ref 6–20)
CO2: 26 mmol/L (ref 22–32)
Calcium: 8.8 mg/dL — ABNORMAL LOW (ref 8.9–10.3)
Chloride: 104 mmol/L (ref 101–111)
Creatinine, Ser: 0.56 mg/dL — ABNORMAL LOW (ref 0.61–1.24)
GFR calc Af Amer: >60 mL/min (ref >60)
GFR calc non Af Amer: >60 mL/min (ref >60)
Glucose, Bld: 108 mg/dL — ABNORMAL HIGH (ref 65–99)
Potassium: 2.8 mmol/L — ABNORMAL LOW (ref 3.5–5.1)
Sodium: 143 mmol/L (ref 135–145)
Total Bilirubin: 0.7 mg/dL (ref 0.3–1.2)
Total Protein: 7.9 g/dL (ref 6.5–8.1)

## 2015-08-28 LAB — URINALYSIS, ROUTINE W REFLEX MICROSCOPIC
Bilirubin Urine: NEGATIVE
Glucose, UA: NEGATIVE mg/dL
Hgb urine dipstick: NEGATIVE
Ketones, ur: NEGATIVE mg/dL
Leukocytes, UA: NEGATIVE
Nitrite: NEGATIVE
Protein, ur: NEGATIVE mg/dL
Specific Gravity, Urine: 1.004 — ABNORMAL LOW (ref 1.005–1.030)
pH: 6.5 (ref 5.0–8.0)

## 2015-08-28 LAB — CBC WITH DIFFERENTIAL/PLATELET
Basophils Absolute: 0 10*3/uL (ref 0.0–0.1)
Basophils Relative: 0 %
Eosinophils Absolute: 0.1 10*3/uL (ref 0.0–0.7)
Eosinophils Relative: 1 %
HCT: 37.8 % — ABNORMAL LOW (ref 39.0–52.0)
Hemoglobin: 13.3 g/dL (ref 13.0–17.0)
Lymphocytes Relative: 37 %
Lymphs Abs: 2.4 10*3/uL (ref 0.7–4.0)
MCH: 31.7 pg (ref 26.0–34.0)
MCHC: 35.2 g/dL (ref 30.0–36.0)
MCV: 90 fL (ref 78.0–100.0)
Monocytes Absolute: 0.4 10*3/uL (ref 0.1–1.0)
Monocytes Relative: 6 %
Neutro Abs: 3.5 10*3/uL (ref 1.7–7.7)
Neutrophils Relative %: 56 %
Platelets: 201 10*3/uL (ref 150–400)
RBC: 4.2 MIL/uL — ABNORMAL LOW (ref 4.22–5.81)
RDW: 16.1 % — ABNORMAL HIGH (ref 11.5–15.5)
WBC: 6.3 10*3/uL (ref 4.0–10.5)

## 2015-08-28 LAB — LIPASE, BLOOD: Lipase: 37 U/L (ref 11–51)

## 2015-08-28 MED ORDER — LORAZEPAM 1 MG PO TABS
1.0000 mg | ORAL_TABLET | Freq: Once | ORAL | Status: AC
  Administered 2015-08-28: 1 mg via ORAL
  Filled 2015-08-28: qty 1

## 2015-08-28 MED ORDER — SODIUM CHLORIDE 0.9 % IV BOLUS (SEPSIS): 1000.0000 mL | Freq: Once | INTRAVENOUS | Status: DC

## 2015-08-28 MED ORDER — ONDANSETRON HCL 4 MG/2ML IJ SOLN
4.0000 mg | Freq: Once | INTRAMUSCULAR | Status: AC
  Administered 2015-08-28: 4 mg via INTRAVENOUS
  Filled 2015-08-28: qty 2

## 2015-08-28 MED ORDER — SODIUM CHLORIDE 0.9 % IV BOLUS (SEPSIS)
1000.0000 mL | Freq: Once | INTRAVENOUS | Status: AC
Start: 2015-08-28 — End: 2015-08-28
  Administered 2015-08-28: 1000 mL via INTRAVENOUS

## 2015-08-28 NOTE — ED Notes (Signed)
Bed: [id number] Expected date:  Expected time:  Means of arrival:  Comments: EMS- positive c-diff, ETOH

## 2015-08-28 NOTE — ED Notes (Signed)
Patient reported that he needed to leave the ED immediately because he had to [facility] because he is under court order to stay at the shelter and if he does not return he will go to jail. EDP notified.

## 2015-08-28 NOTE — ED Provider Notes (Signed)
CSN: [id number]     Arrival date & time 08/28/15  1315 History   First MD Initiated Contact with Patient 08/28/15 1325     Chief Complaint  Patient presents with  . Abdominal Pain  . Alcohol Intoxication     (Consider location/radiation/quality/duration/timing/severity/associated sxs/prior Treatment) HPI   55yM with abdominal pain. Periumbilical to lower abdomen. Onset yesterday. Persistent since then. He hasn't noticed anything that makes it better or worse. He is an alcoholic and reports he drank today to ease his pain. It did not help. He has had pancreatitis previously, but not sure if current symptoms are similar to this. He reports he was recently treated for an "bowel infection" weeks ago and is concerned he may have a reoccurence. Per review of records, he was admitted to [facility] 1 month ago for alcohol gastritis and cdiff. He reports he finished his antibiotics. He denies continued diarrhea.   Past Medical History  Diagnosis Date  . Back pain   . Neck pain   . Bipolar 1 disorder (HCC)   . ETOH abuse   . Withdrawal seizures (HCC)   . Pancreatitis    History reviewed. No pertinent past surgical history. Family History  Problem Relation Age of Onset  . Hypertension Mother    Social History  Substance Use Topics  . Smoking status: Never Smoker   . Smokeless tobacco: Never Used  . Alcohol Use: Yes     Comment: daily    Review of Systems  All systems reviewed and negative, other than as noted in HPI.   Allergies  Haldol; Thorazine ; Hydrocodone; Seroquel; and Lithium  Home Medications   Prior to Admission medications   Not on File   BP 140/101 mmHg  Pulse 96  Temp(Src) 97.8 F (36.6 C) (Oral)  Resp 17  SpO2 95% Physical Exam  Constitutional: He appears well-developed and well-nourished. No distress.  HENT:  Head: Normocephalic and atraumatic.  Eyes: Conjunctivae are normal. Right eye exhibits no discharge. Left eye exhibits no discharge.  Neck: Neck supple.   Cardiovascular: Normal rate, regular rhythm and normal heart sounds.  Exam reveals no gallop and no friction rub.   No murmur heard. Pulmonary/Chest: Effort normal and breath sounds normal. No respiratory distress.  Abdominal: Soft. There is tenderness.  Mild periumbilical/lwoer abdominal tenderness. No rebound or guarding. Obese abdomen. Doesn't seem distended.   Musculoskeletal: He exhibits no edema or tenderness.  Neurological: He is alert.  Speech somewhat slurred but understandable. No gross deficits.   Skin: Skin is warm and dry.  Psychiatric:  Generalized psychomotor slowing consistent with alcohol intoxication.   Nursing note and vitals reviewed.   ED Course  Procedures (including critical care time) Labs Review Labs Reviewed  CBC WITH DIFFERENTIAL/PLATELET - Abnormal; Notable for the following:    RBC 4.20 (*)    HCT 37.8 (*)    RDW 16.1 (*)    All other components within normal limits  COMPREHENSIVE METABOLIC PANEL - Abnormal; Notable for the following:    Potassium 2.8 (*)    Glucose, Bld 108 (*)    BUN 5 (*)    Creatinine, Ser 0.56 (*)    Calcium 8.8 (*)    AST 57 (*)    All other components within normal limits  URINALYSIS, ROUTINE W REFLEX MICROSCOPIC (NOT AT [facility]) - Abnormal; Notable for the following:    Specific Gravity, Urine 1.004 (*)    All other components within normal limits  LIPASE, BLOOD    Imaging  Review No results found. I have personally reviewed and evaluated these images and lab results as part of my medical decision-making.   EKG Interpretation None      MDM   Final diagnoses:  Abdominal pain, unspecified abdominal location  Alcohol intoxication, uncomplicated (HCC)    55 year old male with abdominal pain. Multiple other complaints. He is distractible exam. Low suspicion for acute surgical process or other emergent etiology. Treated symptomatically some improvement. Labs fairly unremarkable aside from hypoK. Counseled on substance  abuse. It has been determined that no acute conditions requiring further emergency intervention are present at this time. The patient has been advised of the diagnosis and plan. I reviewed any labs and imaging including any potential incidental findings. We have discussed signs and symptoms that warrant return to the ED and they are listed in the discharge instructions.      Raeford Razor, MD 09/12/15 [phone]

## 2015-08-28 NOTE — Discharge Instructions (Signed)

## 2015-08-28 NOTE — ED Notes (Signed)
Patient states he drank a couple of wine coolers this AM. Patient states that he has been drinking to ease his abdominal pain.

## 2015-08-28 NOTE — ED Notes (Signed)
Per EMS- Patient c/o mid and lower abdominal pain. Patient states he was recently diagnosed with a bowel infection and was prescribed antibiotics. Patient has a history of drinking cooking wine daily.

## 2015-08-28 NOTE — ED Notes (Signed)
CIWA was done at 1530 prior to giving Ativan, but did not chart until 1545 due to patient being on Enteric Isolation.

## 2015-09-04 ENCOUNTER — Emergency Department (HOSPITAL_COMMUNITY)
Admission: EM | Admit: 2015-09-04 | Discharge: 2015-09-04 | Disposition: A | Discharge status: Home / Self Care | Payer: Medicaid Other | Attending: Emergency Medicine | Admitting: Emergency Medicine

## 2015-09-04 ENCOUNTER — Encounter (HOSPITAL_COMMUNITY): Payer: Self-pay | Admitting: Emergency Medicine

## 2015-09-04 DIAGNOSIS — Z79899 Other long term (current) drug therapy: Secondary | ICD-10-CM | POA: Insufficient documentation

## 2015-09-04 DIAGNOSIS — F10920 Alcohol use, unspecified with intoxication, uncomplicated: Secondary | ICD-10-CM

## 2015-09-04 DIAGNOSIS — E876 Hypokalemia: Secondary | ICD-10-CM | POA: Diagnosis not present

## 2015-09-04 DIAGNOSIS — F319 Bipolar disorder, unspecified: Secondary | ICD-10-CM | POA: Diagnosis not present

## 2015-09-04 DIAGNOSIS — Z8669 Personal history of other diseases of the nervous system and sense organs: Secondary | ICD-10-CM | POA: Diagnosis not present

## 2015-09-04 DIAGNOSIS — F10120 Alcohol abuse with intoxication, uncomplicated: Secondary | ICD-10-CM | POA: Diagnosis not present

## 2015-09-04 DIAGNOSIS — R112 Nausea with vomiting, unspecified: Secondary | ICD-10-CM | POA: Diagnosis present

## 2015-09-04 DIAGNOSIS — F1010 Alcohol abuse, uncomplicated: Secondary | ICD-10-CM

## 2015-09-04 LAB — RAPID URINE DRUG SCREEN, HOSP PERFORMED
Amphetamines: NOT DETECTED
BARBITURATES: NOT DETECTED
BENZODIAZEPINES: NOT DETECTED
Cocaine: NOT DETECTED
Opiates: NOT DETECTED
Tetrahydrocannabinol: NOT DETECTED

## 2015-09-04 LAB — COMPREHENSIVE METABOLIC PANEL
ALBUMIN: 4.6 g/dL (ref 3.5–5.0)
ALT: 47 U/L (ref 17–63)
AST: 116 U/L — AB (ref 15–41)
Alkaline Phosphatase: 148 U/L — ABNORMAL HIGH (ref 38–126)
Anion gap: 16 — ABNORMAL HIGH (ref 5–15)
CHLORIDE: 99 mmol/L — AB (ref 101–111)
CO2: 24 mmol/L (ref 22–32)
CREATININE: 0.47 mg/dL — AB (ref 0.61–1.24)
Calcium: 8.6 mg/dL — ABNORMAL LOW (ref 8.9–10.3)
GFR calc Af Amer: >60 mL/min (ref >60)
GFR calc non Af Amer: >60 mL/min (ref >60)
GLUCOSE: 120 mg/dL — AB (ref 65–99)
Potassium: 2.9 mmol/L — ABNORMAL LOW (ref 3.5–5.1)
SODIUM: 139 mmol/L (ref 135–145)
Total Bilirubin: 0.8 mg/dL (ref 0.3–1.2)
Total Protein: 7.6 g/dL (ref 6.5–8.1)

## 2015-09-04 LAB — CBC
HEMATOCRIT: 35.6 % — AB (ref 39.0–52.0)
Hemoglobin: 12.7 g/dL — ABNORMAL LOW (ref 13.0–17.0)
MCH: 32 pg (ref 26.0–34.0)
MCHC: 35.7 g/dL (ref 30.0–36.0)
MCV: 89.7 fL (ref 78.0–100.0)
Platelets: 100 10*3/uL — ABNORMAL LOW (ref 150–400)
RBC: 3.97 MIL/uL — ABNORMAL LOW (ref 4.22–5.81)
RDW: 16.8 % — AB (ref 11.5–15.5)
WBC: 4.6 10*3/uL (ref 4.0–10.5)

## 2015-09-04 LAB — ETHANOL: Alcohol, Ethyl (B): 335 mg/dL (ref <5)

## 2015-09-04 MED ORDER — POTASSIUM CHLORIDE CRYS ER 20 MEQ PO TBCR: EXTENDED_RELEASE_TABLET | ORAL | Status: DC

## 2015-09-04 MED ORDER — POTASSIUM CHLORIDE CRYS ER 20 MEQ PO TBCR
40.0000 meq | EXTENDED_RELEASE_TABLET | Freq: Once | ORAL | Status: AC
  Administered 2015-09-04: 40 meq via ORAL
  Filled 2015-09-04: qty 2

## 2015-09-04 MED ORDER — ONDANSETRON 8 MG PO TBDP
8.0000 mg | ORAL_TABLET | Freq: Once | ORAL | Status: AC
  Administered 2015-09-04: 8 mg via ORAL
  Filled 2015-09-04: qty 1

## 2015-09-04 MED ORDER — POTASSIUM CHLORIDE CRYS ER 20 MEQ PO TBCR
20.0000 meq | EXTENDED_RELEASE_TABLET | Freq: Once | ORAL | Status: AC
  Administered 2015-09-04: 20 meq via ORAL

## 2015-09-04 NOTE — ED Notes (Signed)
Unsuccessful with labs rn have been made aware

## 2015-09-04 NOTE — ED Notes (Signed)
Per Augusto GambleJody, taxi voucher approved, [facility].

## 2015-09-04 NOTE — ED Notes (Signed)
Patient given Sprite for oral trial. 

## 2015-09-04 NOTE — ED Notes (Signed)
Pt given meal tray and asked to be taken to the [facility].

## 2015-09-04 NOTE — ED Notes (Addendum)
Patient brought in by [facility]. Patient wants to get back into the program but needs "[facility] before" Carlyle Basqueslla [place] [phone].  Patient has a hx of ETOH abuse. 52 oz of wine consumption today

## 2015-09-04 NOTE — ED Notes (Signed)
Primary RN made aware of alcohol level. 

## 2015-09-04 NOTE — ED Notes (Signed)
Per Dr. Denton LankSteinl give 40 mEq Potassium only.

## 2015-09-04 NOTE — Discharge Instructions (Signed)
It was our pleasure to provide your ER care today - we hope that you feel better.  Rest. Drink adequate fluids (non-alcoholic fluids).  Follow up with primary care doctor in the coming week.  Do not drink alcohol.  Follow up with AA, and use resource guide provided for alcohol abuse treatment options.   No driving for the next 10 hours, and never drink and drive.   From today's labs, your potassium level is low - eat plenty of fruits and vegetables, take potassium supplement as prescribed, and follow up with primary care doctor for recheck.  Return to ER if worse, new symptoms, medical emergency, other concern.     Alcohol Intoxication Alcohol intoxication occurs when the amount of alcohol that a person has consumed impairs his or her ability to mentally and physically function. Alcohol directly impairs the normal chemical activity of the brain. Drinking large amounts of alcohol can lead to changes in mental function and behavior, and it can cause many physical effects that can be harmful.  Alcohol intoxication can range in severity from mild to very severe. Various factors can affect the level of intoxication that occurs, such as the person's age, gender, weight, frequency of alcohol consumption, and the presence of other medical conditions (such as diabetes, seizures, or heart conditions). Dangerous levels of alcohol intoxication may occur when people drink large amounts of alcohol in a short period (binge drinking). Alcohol can also be especially dangerous when combined with certain prescription medicines or "recreational" drugs. SIGNS AND SYMPTOMS Some common signs and symptoms of mild alcohol intoxication include:  Loss of coordination.  Changes in mood and behavior.  Impaired judgment.  Slurred speech. As alcohol intoxication progresses to more severe levels, other signs and symptoms will appear. These may include:  Vomiting.  Confusion and impaired memory.  Slowed  breathing.  Seizures.  Loss of consciousness. DIAGNOSIS  Your health care provider will take a medical history and perform a physical exam. You will be asked about the amount and type of alcohol you have consumed. Blood tests will be done to measure the concentration of alcohol in your blood. In many places, your blood alcohol level must be lower than 80 mg/dL (1.61%) to legally drive. However, many dangerous effects of alcohol can occur at much lower levels.  TREATMENT  People with alcohol intoxication often do not require treatment. Most of the effects of alcohol intoxication are temporary, and they go away as the alcohol naturally leaves the body. Your health care provider will monitor your condition until you are stable enough to go home. Fluids are sometimes given through an IV access tube to help prevent dehydration.  HOME CARE INSTRUCTIONS  Do not drive after drinking alcohol.  Stay hydrated. Drink enough water and fluids to keep your urine clear or pale yellow. Avoid caffeine.   Only take over-the-counter or prescription medicines as directed by your health care provider.  SEEK MEDICAL CARE IF:   You have persistent vomiting.   You do not feel better after a few days.  You have frequent alcohol intoxication. Your health care provider can help determine if you should see a substance use treatment counselor. SEEK IMMEDIATE MEDICAL CARE IF:   You become shaky or tremble when you try to stop drinking.   You shake uncontrollably (seizure).   You throw up (vomit) blood. This may be bright red or may look like black coffee grounds.   You have blood in your stool. This may be bright red or  may appear as a black, tarry, bad smelling stool.   You become lightheaded or faint.  MAKE SURE YOU:   Understand these instructions.  Will watch your condition.  Will get help right away if you are not doing well or get worse.   This information is not intended to replace advice  given to you by your health care provider. Make sure you discuss any questions you have with your health care provider.   Document Released: 12/16/2004 Document Revised: 11/08/2012 Document Reviewed: 08/11/2012 Elsevier Interactive Patient Education 2016 [employer].     Alcohol Use Disorder Alcohol use disorder is a mental disorder. It is not a one-time incident of heavy drinking. Alcohol use disorder is the excessive and uncontrollable use of alcohol over time that leads to problems with functioning in one or more areas of daily living. People with this disorder risk harming themselves and others when they drink to excess. Alcohol use disorder also can cause other mental disorders, such as mood and anxiety disorders, and serious physical problems. People with alcohol use disorder often misuse other drugs.  Alcohol use disorder is common and widespread. Some people with this disorder drink alcohol to cope with or escape from negative life events. Others drink to relieve chronic pain or symptoms of mental illness. People with a family history of alcohol use disorder are at higher risk of losing control and using alcohol to excess.  Drinking too much alcohol can cause injury, accidents, and health problems. One drink can be too much when you are:  Working.  Pregnant or breastfeeding.  Taking medicines. Ask your doctor.  Driving or planning to drive. SYMPTOMS  Signs and symptoms of alcohol use disorder may include the following:   Consumption ofalcohol inlarger amounts or over a longer period of time than intended.  Multiple unsuccessful attempts to cutdown or control alcohol use.   A great deal of time spent obtaining alcohol, using alcohol, or recovering from the effects of alcohol (hangover).  A strong desire or urge to use alcohol (cravings).   Continued use of alcohol despite problems at work, school, or home because of alcohol use.   Continued use of alcohol despite  problems in relationships because of alcohol use.  Continued use of alcohol in situations when it is physically hazardous, such as driving a car.  Continued use of alcohol despite awareness of a physical or psychological problem that is likely related to alcohol use. Physical problems related to alcohol use can involve the brain, heart, liver, stomach, and intestines. Psychological problems related to alcohol use include intoxication, depression, anxiety, psychosis, delirium, and dementia.   The need for increased amounts of alcohol to achieve the same desired effect, or a decreased effect from the consumption of the same amount of alcohol (tolerance).  Withdrawal symptoms upon reducing or stopping alcohol use, or alcohol use to reduce or avoid withdrawal symptoms. Withdrawal symptoms include:  Racing heart.  Hand tremor.  Difficulty sleeping.  Nausea.  Vomiting.  Hallucinations.  Restlessness.  Seizures. DIAGNOSIS Alcohol use disorder is diagnosed through an assessment by your health care provider. Your health care provider may start by asking three or four questions to screen for excessive or problematic alcohol use. To confirm a diagnosis of alcohol use disorder, at least two symptoms must be present within a 78-month period. The severity of alcohol use disorder depends on the number of symptoms:  Mild--two or three.  Moderate--four or five.  Severe--six or more. Your health care provider may perform  a physical exam or use results from lab tests to see if you have physical problems resulting from alcohol use. Your health care provider may refer you to a mental health professional for evaluation. TREATMENT  Some people with alcohol use disorder are able to reduce their alcohol use to low-risk levels. Some people with alcohol use disorder need to quit drinking alcohol. When necessary, mental health professionals with specialized training in substance use treatment can help. Your  health care provider can help you decide how severe your alcohol use disorder is and what type of treatment you need. The following forms of treatment are available:   Detoxification. Detoxification involves the use of prescription medicines to prevent alcohol withdrawal symptoms in the first week after quitting. This is important for people with a history of symptoms of withdrawal and for heavy drinkers who are likely to have withdrawal symptoms. Alcohol withdrawal can be dangerous and, in severe cases, cause death. Detoxification is usually provided in a hospital or in-patient substance use treatment facility.  Counseling or talk therapy. Talk therapy is provided by substance use treatment counselors. It addresses the reasons people use alcohol and ways to keep them from drinking again. The goals of talk therapy are to help people with alcohol use disorder find healthy activities and ways to cope with life stress, to identify and avoid triggers for alcohol use, and to handle cravings, which can cause relapse.  Medicines.Different medicines can help treat alcohol use disorder through the following actions:  Decrease alcohol cravings.  Decrease the positive reward response felt from alcohol use.  Produce an uncomfortable physical reaction when alcohol is used (aversion therapy).  Support groups. Support groups are run by people who have quit drinking. They provide emotional support, advice, and guidance. These forms of treatment are often combined. Some people with alcohol use disorder benefit from intensive combination treatment provided by specialized substance use treatment centers. Both inpatient and outpatient treatment programs are available.   This information is not intended to replace advice given to you by your health care provider. Make sure you discuss any questions you have with your health care provider.   Document Released: 04/15/2004 Document Revised: 03/29/2014 Document Reviewed:  06/15/2012 Elsevier Interactive Patient Education 2016 [employer].    Alcohol Abuse and Nutrition Alcohol abuse is any pattern of alcohol consumption that harms your health, relationships, or work. Alcohol abuse can affect how your body breaks down and absorbs nutrients from food by causing your liver to work abnormally. Additionally, many people who abuse alcohol do not eat enough carbohydrates, protein, fat, vitamins, and minerals. This can cause poor nutrition (malnutrition) and a lack of nutrients (nutrient deficiencies), which can lead to further complications. Nutrients that are commonly lacking (deficient) among people who abuse alcohol include:  Vitamins.  Vitamin A. This is stored in your liver. It is important for your vision, metabolism, and ability to fight off infections (immunity).  B vitamins. These include vitamins such as folate, thiamin, and niacin. These are important in new cell growth and maintenance.  Vitamin C. This plays an important role in iron absorption, wound healing, and immunity.  Vitamin D. This is produced by your liver, but you can also get vitamin D from food. Vitamin D is necessary for your body to absorb and use calcium.  Minerals.  Calcium. This is important for your bones and your heart and blood vessel (cardiovascular) function.  Iron. This is important for blood, muscle, and nervous system functioning.  Magnesium. This plays  an important role in muscle and nerve function, and it helps to control blood sugar and blood pressure.  Zinc. This is important for the normal function of your nervous system and digestive system (gastrointestinal tract). Nutrition is an essential component of therapy for alcohol abuse. Your health care provider or dietitian will work with you to design a plan that can help restore nutrients to your body and prevent potential complications. WHAT IS MY PLAN? Your dietitian may develop a specific diet plan that is based on  your condition and any other complications you may have. A diet plan will commonly include:  A balanced diet.  Grains: 6-8 oz per day.  Vegetables: 2-3 cups per day.  Fruits: 1-2 cups per day.  Meat and other protein: 5-6 oz per day.  Dairy: 2-3 cups per day.  Vitamin and mineral supplements. WHAT DO I NEED TO KNOW ABOUT ALCOHOL AND NUTRITION?  Consume foods that are high in antioxidants, such as grapes, berries, nuts, green tea, and dark green and orange vegetables. This can help to counteract some of the stress that is placed on your liver by consuming alcohol.  Avoid food and drinks that are high in fat and sugar. Foods such as sugared soft drinks, salty snack foods, and candy contain empty calories. This means that they lack important nutrients such as protein, fiber, and vitamins.  Eat frequent meals and snacks. Try to eat 5-6 small meals each day.  Eat a variety of fresh fruits and vegetables each day. This will help you get plenty of water, fiber, and vitamins in your diet.  Drink plenty of water and other clear fluids. Try to drink at least 48-64 oz (1.5-2 L) of water per day.  If you are a vegetarian, eat a variety of protein-rich foods. Pair whole grains with plant-based proteins at meals and snacks to obtain the greatest nutrient benefit from your food. For example, eat rice with beans, put peanut butter on whole-grain toast, or eat oatmeal with sunflower seeds.  Soak beans and whole grains overnight before cooking. This can help your body to absorb the nutrients more easily.  Include foods fortified with vitamins and minerals in your diet. Commonly fortified foods include milk, orange juice, cereal, and bread.  If you are malnourished, your dietitian may recommend a high-protein, high-calorie diet. This may include:  2,000-3,000 calories (kilocalories) per day.  70-100 grams of protein per day.  Your health care provider may recommend a complete nutritional  supplement beverage. This can help to restore calories, protein, and vitamins to your body. Depending on your condition, you may be advised to consume this instead of or in addition to meals.  Limit your intake of caffeine. Replace drinks like coffee and black tea with decaffeinated coffee and herbal tea.  Eat a variety of foods that are high in omega fatty acids. These include fish, nuts and seeds, and soybeans. These foods may help your liver to recover and may also stabilize your mood.  Certain medicines may cause changes in your appetite, taste, and weight. Work with your health care provider and dietitian to make any adjustments to your medicines and diet plan.  Include other healthy lifestyle choices in your daily routine.  Be physically active.  Get enough sleep.  Spend time doing activities that you enjoy.  If you are unable to take in enough food and calories by mouth, your health care provider may recommend a feeding tube. This is a tube that passes through your nose and  throat, directly into your stomach. Nutritional supplement beverages can be given to you through the feeding tube to help you get the nutrients you need.  Take vitamin or mineral supplements as recommended by your health care provider. WHAT FOODS CAN I EAT? Grains Enriched pasta. Enriched rice. Fortified whole-grain bread. Fortified whole-grain cereal. Barley. Brown rice. Quinoa. Millet. Vegetables All fresh, frozen, and canned vegetables. Spinach. Kale. Artichoke. Carrots. Winter squash and pumpkin. Sweet potatoes. Broccoli. Cabbage. Cucumbers. Tomatoes. Sweet peppers. Green beans. Peas. Corn. Fruits All fresh and frozen fruits. Berries. Grapes. Mango. Papaya. Guava. Cherries. Apples. Bananas. Peaches. Plums. Pineapple. Watermelon. Cantaloupe. Oranges. Avocado. Meats and Other Protein Sources Beef liver. Lean beef. Pork. Fresh and canned chicken. Fresh fish. Oysters. Sardines. Canned tuna. Shrimp. Eggs with  yolks. Nuts and seeds. Peanut butter. Beans and lentils. Soybeans. Tofu. Dairy Whole, low-fat, and nonfat milk. Whole, low-fat, and nonfat yogurt. Cottage cheese. Sour cream. Hard and soft cheeses. Beverages Water. Herbal tea. Decaffeinated coffee. Decaffeinated green tea. 100% fruit juice. 100% vegetable juice. Instant breakfast shakes. Condiments Ketchup. Mayonnaise. Mustard. Salad dressing. Barbecue sauce. Sweets and Desserts Sugar-free ice cream. Sugar-free pudding. Sugar-free gelatin. Fats and Oils Butter. Vegetable oil, flaxseed oil, olive oil, and walnut oil. Other Complete nutrition shakes. Protein bars. Sugar-free gum. The items listed above may not be a complete list of recommended foods or beverages. Contact your dietitian for more options. WHAT FOODS ARE NOT RECOMMENDED? Grains Sugar-sweetened breakfast cereals. Flavored instant oatmeal. Fried breads. Vegetables Breaded or deep-fried vegetables. Fruits Dried fruit with added sugar. Candied fruit. Canned fruit in syrup. Meats and Other Protein Sources Breaded or deep-fried meats. Dairy Flavored milks. Fried cheese curds or fried cheese sticks. Beverages Alcohol. Sugar-sweetened soft drinks. Sugar-sweetened tea. Caffeinated coffee and tea. Condiments Sugar. Honey. Agave nectar. Molasses. Sweets and Desserts Chocolate. Cake. Cookies. Candy. Other Potato chips. Pretzels. Salted nuts. Candied nuts. The items listed above may not be a complete list of foods and beverages to avoid. Contact your dietitian for more information.   This information is not intended to replace advice given to you by your health care provider. Make sure you discuss any questions you have with your health care provider.   Document Released: 12/31/2004 Document Revised: 03/29/2014 Document Reviewed: 10/09/2013 Elsevier Interactive Patient Education 2016 [employer]   Hypokalemia Hypokalemia means that the amount of potassium in the blood is  lower than normal.Potassium is a chemical, called an electrolyte, that helps regulate the amount of fluid in the body. It also stimulates muscle contraction and helps nerves function properly.Most of the body's potassium is inside of cells, and only a very small amount is in the blood. Because the amount in the blood is so small, minor changes can be life-threatening. CAUSES  Antibiotics.  Diarrhea or vomiting.  Using laxatives too much, which can cause diarrhea.  Chronic kidney disease.  Water pills (diuretics).  Eating disorders (bulimia).  Low magnesium level.  Sweating a lot. SIGNS AND SYMPTOMS  Weakness.  Constipation.  Fatigue.  Muscle cramps.  Mental confusion.  Skipped heartbeats or irregular heartbeat (palpitations).  Tingling or numbness. DIAGNOSIS  Your health care provider can diagnose hypokalemia with blood tests. In addition to checking your potassium level, your health care provider may also check other lab tests. TREATMENT Hypokalemia can be treated with potassium supplements taken by mouth or adjustments in your current medicines. If your potassium level is very low, you may need to get potassium through a vein (IV) and be monitored in the hospital. A  diet high in potassium is also helpful. Foods high in potassium are:  Nuts, such as peanuts and pistachios.  Seeds, such as sunflower seeds and pumpkin seeds.  Peas, lentils, and lima beans.  Whole grain and bran cereals and breads.  Fresh fruit and vegetables, such as apricots, avocado, bananas, cantaloupe, kiwi, oranges, tomatoes, asparagus, and potatoes.  Orange and tomato juices.  Red meats.  Fruit yogurt. HOME CARE INSTRUCTIONS  Take all medicines as prescribed by your health care provider.  Maintain a healthy diet by including nutritious food, such as fruits, vegetables, nuts, whole grains, and lean meats.  If you are taking a laxative, be sure to follow the directions on the  label. SEEK MEDICAL CARE IF:  Your weakness gets worse.  You feel your heart pounding or racing.  You are vomiting or having diarrhea.  You are diabetic and having trouble keeping your blood glucose in the normal range. SEEK IMMEDIATE MEDICAL CARE IF:  You have chest pain, shortness of breath, or dizziness.  You are vomiting or having diarrhea for more than 2 days.  You faint. MAKE SURE YOU:   Understand these instructions.  Will watch your condition.  Will get help right away if you are not doing well or get worse.   This information is not intended to replace advice given to you by your health care provider. Make sure you discuss any questions you have with your health care provider.   Document Released: 03/08/2005 Document Revised: 03/29/2014 Document Reviewed: 09/08/2012 Elsevier Interactive Patient Education 2016 [employer].   [employer] Guide Outpatient Counseling/Substance Abuse Adult [employer] 211 is a great source of information about community services available.  Access by dialing 2-1-1 from anywhere in [place], or by website -  PooledIncome.pl.   Other Local Resources (Updated 03/2015)  Crisis Hotlines   Services     Area Served  [employer]  Crisis Hotline, available 24 hours a day, 7 days a week: (401)245-9656 [facility], [place]   [facility]  Crisis Hotline, available 24 hours a day, 7 days a week: [phone]) [phone] [facility], [place]  [facility]  Suicide Prevention Hotline, available 24 hours a day, 7 days a week: (819)527-8833 [facility], [place]  [employer], available 24 hours a day, 7 days a week: 423-600-3197 [facility], [place]   [facility] Access to [employer], available 24 hours a day, 7 days a week: ([phone])802-9936 All   [facility]  Crisis Hotline, available 24 hours a day, 7 days a week: 787-878-0609 All   Other Local  Resources (Updated 03/2015)  Outpatient Counseling/ Substance Abuse Programs  Services     Address and Phone Number  [facility] ([facility])   Options include Individual counseling, group counseling, intensive outpatient program (several hours a day, several days a week)  Offers depression assessments  Provides methadone maintenance program [phone] [address] [place], [place] [address]   [facility]   Offers partial hospitalization/day treatment and DUI/DWI programs  [employer], private insurance [phone] [address] [place], [place] [address]  [facility]    Services include intensive outpatient program (several hours a day, several days a week), outpatient treatment, DUI/DWI services, family education  Also has some services specifically for [employer] transitional housing  [phone] [address] [place], [place] [address]     [facility]  [employer], private pay, and private insurance  [phone] [address] [place], [place] [address]  [employer] [facility]  Services include individual counseling, substance abuse intensive outpatient program (several hours a day, several days a week), day treatment  Delene Loll, Medicaid, private insurance [phone] [address] [place], [place] [address]  [facility]   Offers substance abuse intensive outpatient program (several hours a day, several days a week), partial hospitalization program [phone] [address] [place], [place] [address]  [phone] [address] [place], [place] [address]  [phone] [address] [place], [place] [address]  [phone], [address] [place], [place] [address]  [facility]  Individual counseling only  Accepts private insurance only [phone] [address] [place], [place] [address]  [facility]  Methadone maintenance program [phone] [address] [place], [place] [address]  [facility]  Walk-In Clinic providing substance abuse and mental health counseling  Accepts Medicaid, Medicare, private insurance  Offers sliding scale for uninsured [phone] [address] [place], [place]   [facility] [employer].  Offers individual counseling, and intensive in-home services [phone] [address] [place], [place] [address]  [facility] individual counseling, family counseling, group therapy, domestic violence counseling, consumer credit counseling  Accepts Medicare, Medicaid, private insurance  Offers sliding scale for uninsured [phone] [address] [place], [place] [address]  [phone] [facility], [address] [place], [place] [address]  [facility]  Offers individual, family and group counseling  3 locations - [place], [place], and [place]  [phone]  [address] [place], [place] [address]  [address] [place], [place] [address]  [address] [place], [place] [address]  [facility]    Offers psychiatric assessment, 8-week Intensive Outpatient Program (several hours a day, several times a week, daytime or evenings), early recovery group, family Program, medication management  Private pay or private insurance only [phone], or  [phone] [address] [place], [place] [address]  [place] [employer] individual, couples and family counseling  Accepts Medicaid, private insurance, and sliding scale for uninsured [phone] [address] [place], [place] [address]  Len Blalock, MD  Individual counseling  Private insurance [phone] [address] [place], [place] [address]  [facility]   Offers assessment, substance abuse treatment, and  behavioral health treatment [phone] [address] [place], [place] [address]  [facility]  Individual counseling  Accepts private insurance [phone] [address] [place], [place] [address]  [facility]  Individual counseling  Accepts Medicare, private insurance [phone] [address] [place], [place] [address]  [facility]    Offers intensive outpatient program (several hours a day, several times a week)  Private pay, private insurance [phone] [facility] [place], [place]  [facility]  Individual counseling  Medicare, private insurance [phone] [address] [place], [place] [address]  [facility]    Offers intensive outpatient program (several hours a day, several times a week) and partial hospitalization program [phone] [address] [place], [place] [address]  Emerson Monte, MD  Individual counseling [phone] [address] [place], [place] [address]  [facility]  Offers Christian counseling to individuals, couples, and families  Accepts Medicare and private insurance; offers sliding scale for uninsured [phone] [address] [place], [place] [address]  [facility]  [place] [facility] [phone] [address] [place], [place] [address]  [facility] crisis counseling, individual counseling, group therapy, in-home therapy, domestic violence services, day treatment, DWI services, Administrator, arts (CST), Doctor, hospital (ACTT), substance abuse Intensive Outpatient Program (several hours a day, several times a week)  2 locations - [place] and [place] [phone] [address] [place], [place] [address]  [phone] [address] [place], [place] [address]  [facility]      Individual counseling and group therapy  [employer], [place], [place] [phone] [address] [place], [place]  [facility]  Offers individual and family counseling  Offers LGBTQ services  Accepts private insurance and private pay [phone] [address] [place], [place] [address]  [facility]    Offers individual counseling, group therapy, and outpatient detox  Accepts private insurance [phone] [address] [place], [place]  [facility]  Individual counseling  Accepts Medicare, private insurance [phone] [address] [place], [place] [address]  [employer]  Individual counseling  Accepts Medicare, private insurance [phone] [address] [place], [place] [address]  [facility]   Offers substance abuse Intensive Outpatient Program (several hours a day, several times a week) [phone], or [phone] [place], [place]     [employer] Guide Inpatient Behavioral Health/Residential  Substance Abuse Treatment Adults [employer] [address] is a great source of information about community services available.  Access by dialing 2-1-1 from anywhere in [place], or by website -  PooledIncome.pl.   (Updated 03/2015)  Crisis Assistance 24 hours a day   Services Offered    [facility] [employer]  24-hour crisis assistance: [phone] [place], [place]   [facility]  24-hour crisis assistance:[phone] [place], [place]  [place]   24-hour crisis assistance: [phone] [place], [place]   [facility] Access to Care Line  24-hour crisis assistance; 916-287-6154 All   [facility]  24-hour crisis response line: 404-866-5928 All   Other Local Resources (Updated 03/2015)  Inpatient Behavioral Health/Residential Substance Abuse Treatment  Programs   Services      Address and Phone Number  [facility])   14-day residential rehabilitation  [phone] [address] [place], [place]  [facility] ([facility])    Detox - private pay only  14-day residential rehabilitation -  Medicaid, insurance, private pay only [phone], or [phone] [address] [place], [place] [address]   [facility] [employer] only  Multiple facilities [phone] admissions   [facility] ([facility])   90-day program  Must be homeless to participate  [phone], or [phone] Marcy Panning, [facility]  [facility] only [phone], or  [phone] [address] [place], [place] [address]  [facility]     Must make an appointment  Transportation is offered from [place] on [employer].  Accepts private pay, Sheryn Bison [facility] [phone]  [address] [place], [place] [address]   [employer]  Females only  Associated with the [facility] : [facility] [phone] [address] [place], [place] [address]  [facility] only [phone], or [phone] [address] [place], [place]  [facility]    Detox  Residential rehabilitation  Private insurance only  Multiple locations [phone] admissions  [facility]  Private pay  Private insurance [phone] [address] [place], [place] [address]  [facility]    Males only  Fee required at time of admission [phone] [address] [place], [place] [address]  [facility]    Private pay only  [phone] [address] [place], [place]  [facility] ([facility])    Detox - private pay, Medicaid  Residential rehabilitation for males  - Medicare, Medicaid, insurance, private pay  [phone] [address] [place], [place]   [employer]    Walk-in interviews Monday - Saturday from 8 am - 4 pm  Individuals with legal charges are not eligible [phone] [address] [place], [place] [address]  [facility]   Must be willing to work  Must attend Alcoholics Anonymous meetings [phone] [address] [place], [place]   [facility] [employer]    Faith-based program  Private pay only [phone] [address] [place], [place]

## 2015-09-04 NOTE — ED Provider Notes (Addendum)
CSN: [id number]     Arrival date & time 09/04/15  1343 History   First MD Initiated Contact with Patient 09/04/15 1402     Chief Complaint  Patient presents with  . Medical Clearance     (Consider location/radiation/quality/duration/timing/severity/associated sxs/prior Treatment) The history is provided by the patient.  Patient with hx etoh abuse, arrives from outpatient rehab center, reporting nausea and vomiting.  Patient states last drank etoh this AM, cant quantify amount.  States ongoing episodic etoh abuse. Denies hx DTs or complicated ETOH withdrawal.  Denies other substance abuse.  Patient denies fall, trauma, pain or injury. No abd pain. Denies any vomiting in past 3 hours.  No fever or chills. No chest pain or discomfort. Denies depression or thoughts of self harm.      Past Medical History  Diagnosis Date  . Back pain   . Neck pain   . Bipolar 1 disorder (HCC)   . ETOH abuse   . Withdrawal seizures (HCC)   . Pancreatitis    History reviewed. No pertinent past surgical history. Family History  Problem Relation Age of Onset  . Hypertension Mother    Social History  Substance Use Topics  . Smoking status: Never Smoker   . Smokeless tobacco: Never Used  . Alcohol Use: Yes     Comment: daily    Review of Systems  Constitutional: Negative for fever.  HENT: Negative for sore throat.   Eyes: Negative for pain and visual disturbance.  Respiratory: Negative for cough and shortness of breath.   Cardiovascular: Negative for chest pain.  Gastrointestinal: Positive for vomiting. Negative for abdominal pain, diarrhea and abdominal distention.  Genitourinary: Negative for dysuria and flank pain.  Musculoskeletal: Negative for back pain and neck pain.  Skin: Negative for rash.  Neurological: Negative for headaches.  Hematological: Does not bruise/bleed easily.  Psychiatric/Behavioral: Negative for confusion.      Allergies  Haldol; Thorazine ; Hydrocodone; and  Lithium  Home Medications   Prior to Admission medications   Medication Sig Start Date End Date Taking? Authorizing Provider  cloNIDine (CATAPRES) 0.2 MG tablet Take 0.2 mg by mouth 2 (two) times daily. Reported on 08/28/2015 04/29/15   Historical Provider, MD  folic acid (FOLVITE) 1 MG tablet Take 1 mg by mouth daily. Reported on 08/28/2015 08/05/15 08/04/16  Historical Provider, MD  Multiple Vitamin (MULTI-VITAMINS) TABS Take 1 tablet by mouth daily. Reported on 08/28/2015 08/05/15 08/04/16  Historical Provider, MD  pantoprazole (PROTONIX) 40 MG tablet Take 40 mg by mouth 2 (two) times daily. Reported on 08/28/2015 08/05/15 09/04/15  Historical Provider, MD  thiamine 100 MG tablet Take 100 mg by mouth daily. Reported on 08/28/2015 08/05/15 08/04/16  Historical Provider, MD  traZODone (DESYREL) 100 MG tablet Take 100 mg by mouth every evening. Reported on 08/28/2015 04/29/15   Historical Provider, MD   BP 138/83 mmHg  Pulse 108  Temp(Src) 98.2 F (36.8 C) (Oral)  Resp 16  Ht  (1.854 m)  Wt 108.863 kg  BMI 31.67 kg/m2  SpO2 93% Physical Exam  Constitutional: He appears well-developed and well-nourished. No distress.  HENT:  Head: Atraumatic.  Mouth/Throat: Oropharynx is clear and moist.  Eyes: Conjunctivae are normal. Pupils are equal, round, and reactive to light. No scleral icterus.  Neck: Normal range of motion. Neck supple. No tracheal deviation present.  Cardiovascular: Normal rate, regular rhythm, normal heart sounds and intact distal pulses.  Exam reveals no gallop and no friction rub.   No murmur  heard. Pulmonary/Chest: Effort normal and breath sounds normal. No accessory muscle usage. No respiratory distress. He exhibits no tenderness.  Abdominal: Soft. Bowel sounds are normal. He exhibits no distension and no mass. There is no tenderness. There is no rebound and no guarding.  Genitourinary:  No cva tenderness  Musculoskeletal: Normal range of motion. He exhibits no edema or tenderness.   Neurological: He is alert.  Ambulates w steady gait.   Skin: Skin is warm and dry. He is not diaphoretic.  Psychiatric:  Intoxicated appearing.   Nursing note and vitals reviewed.   ED Course  Procedures (including critical care time) Labs Review   Results for orders placed or performed during the hospital encounter of 09/04/15  Comprehensive metabolic panel  Result Value Ref Range   Sodium 139 135 - 145 mmol/L   Potassium 2.9 (L) 3.5 - 5.1 mmol/L   Chloride 99 (L) 101 - 111 mmol/L   CO2 24 22 - 32 mmol/L   Glucose, Bld 120 (H) 65 - 99 mg/dL   BUN <5 (L) 6 - 20 mg/dL   Creatinine, Ser 1.610.47 (L) 0.61 - 1.24 mg/dL   Calcium 8.6 (L) 8.9 - 10.3 mg/dL   Total Protein 7.6 6.5 - 8.1 g/dL   Albumin 4.6 3.5 - 5.0 g/dL   AST 096116 (H) 15 - 41 U/L   ALT 47 17 - 63 U/L   Alkaline Phosphatase 148 (H) 38 - 126 U/L   Total Bilirubin 0.8 0.3 - 1.2 mg/dL   GFR calc non Af Amer >60 >60 mL/min   GFR calc Af Amer >60 >60 mL/min   Anion gap 16 (H) 5 - 15  Ethanol  Result Value Ref Range   Alcohol, Ethyl (B) 335 (HH) <5 mg/dL  cbc  Result Value Ref Range   WBC 4.6 4.0 - 10.5 K/uL   RBC 3.97 (L) 4.22 - 5.81 MIL/uL   Hemoglobin 12.7 (L) 13.0 - 17.0 g/dL   HCT 04.535.6 (L) 40.939.0 - 81.152.0 %   MCV 89.7 78.0 - 100.0 fL   MCH 32.0 26.0 - 34.0 pg   MCHC 35.7 30.0 - 36.0 g/dL   RDW 91.416.8 (H) 78.211.5 - 95.615.5 %   Platelets 100 (L) 150 - 400 K/uL  Rapid urine drug screen (hospital performed)  Result Value Ref Range   Opiates NONE DETECTED NONE DETECTED   Cocaine NONE DETECTED NONE DETECTED   Benzodiazepines NONE DETECTED NONE DETECTED   Amphetamines NONE DETECTED NONE DETECTED   Tetrahydrocannabinol NONE DETECTED NONE DETECTED   Barbiturates NONE DETECTED NONE DETECTED     I have personally reviewed and evaluated these lab results as part of my medical decision-making.    MDM   Labs sent from triage.  Reviewed nursing notes and prior charts for additional history.   k low, hx same, recent nvd.   kcl po.    Meal tray. Po fluids. Tolerates well. No recurrent nvd in ED.  abd soft nt.   Ambulates w steady gait.   Patient currently appears stable for d/c.  Resource guide provided, encouraged patient to maintain sobriety, use resources, and follow up as outpatient.   Ambulates w steady gait. No distress. No tremor or shakes.         Cathren LaineKevin Zakery [place], MD 09/04/15 [phone]

## 2015-09-27 ENCOUNTER — Emergency Department (HOSPITAL_COMMUNITY): Payer: Medicaid Other

## 2015-09-27 ENCOUNTER — Emergency Department (HOSPITAL_COMMUNITY)
Admission: EM | Admit: 2015-09-27 | Discharge: 2015-09-28 | Disposition: A | Discharge status: Home / Self Care | Payer: Medicaid Other | Source: Home / Self Care | Attending: Emergency Medicine | Admitting: Emergency Medicine

## 2015-09-27 ENCOUNTER — Encounter (HOSPITAL_COMMUNITY): Payer: Self-pay | Admitting: Emergency Medicine

## 2015-09-27 DIAGNOSIS — S0083XA Contusion of other part of head, initial encounter: Secondary | ICD-10-CM

## 2015-09-27 DIAGNOSIS — Y9389 Activity, other specified: Secondary | ICD-10-CM | POA: Insufficient documentation

## 2015-09-27 DIAGNOSIS — F10129 Alcohol abuse with intoxication, unspecified: Secondary | ICD-10-CM | POA: Insufficient documentation

## 2015-09-27 DIAGNOSIS — Y929 Unspecified place or not applicable: Secondary | ICD-10-CM | POA: Insufficient documentation

## 2015-09-27 DIAGNOSIS — Z79899 Other long term (current) drug therapy: Secondary | ICD-10-CM

## 2015-09-27 DIAGNOSIS — Z8669 Personal history of other diseases of the nervous system and sense organs: Secondary | ICD-10-CM

## 2015-09-27 DIAGNOSIS — R740 Nonspecific elevation of levels of transaminase and lactic acid dehydrogenase [LDH]: Secondary | ICD-10-CM | POA: Diagnosis not present

## 2015-09-27 DIAGNOSIS — R1084 Generalized abdominal pain: Secondary | ICD-10-CM | POA: Diagnosis present

## 2015-09-27 DIAGNOSIS — F10120 Alcohol abuse with intoxication, uncomplicated: Secondary | ICD-10-CM | POA: Diagnosis not present

## 2015-09-27 DIAGNOSIS — F319 Bipolar disorder, unspecified: Secondary | ICD-10-CM

## 2015-09-27 DIAGNOSIS — Y999 Unspecified external cause status: Secondary | ICD-10-CM | POA: Insufficient documentation

## 2015-09-27 LAB — CBC WITH DIFFERENTIAL/PLATELET
BASOS ABS: 0 10*3/uL (ref 0.0–0.1)
BASOS PCT: 0 %
EOS ABS: 0.1 10*3/uL (ref 0.0–0.7)
EOS PCT: 2 %
HCT: 38.1 % — ABNORMAL LOW (ref 39.0–52.0)
Hemoglobin: 13 g/dL (ref 13.0–17.0)
LYMPHS PCT: 40 %
Lymphs Abs: 3.2 10*3/uL (ref 0.7–4.0)
MCH: 32.9 pg (ref 26.0–34.0)
MCHC: 34.1 g/dL (ref 30.0–36.0)
MCV: 96.5 fL (ref 78.0–100.0)
MONO ABS: 0.6 10*3/uL (ref 0.1–1.0)
Monocytes Relative: 8 %
Neutro Abs: 4.1 10*3/uL (ref 1.7–7.7)
Neutrophils Relative %: 50 %
PLATELETS: 399 10*3/uL (ref 150–400)
RBC: 3.95 MIL/uL — AB (ref 4.22–5.81)
RDW: 17.3 % — AB (ref 11.5–15.5)
WBC: 8.1 10*3/uL (ref 4.0–10.5)

## 2015-09-27 LAB — COMPREHENSIVE METABOLIC PANEL
ALT: 43 U/L (ref 17–63)
AST: 45 U/L — ABNORMAL HIGH (ref 15–41)
Albumin: 4.1 g/dL (ref 3.5–5.0)
Alkaline Phosphatase: 66 U/L (ref 38–126)
Anion gap: 9 (ref 5–15)
BILIRUBIN TOTAL: 0.7 mg/dL (ref 0.3–1.2)
BUN: 16 mg/dL (ref 6–20)
CHLORIDE: 109 mmol/L (ref 101–111)
CO2: 25 mmol/L (ref 22–32)
CREATININE: 0.7 mg/dL (ref 0.61–1.24)
Calcium: 8.6 mg/dL — ABNORMAL LOW (ref 8.9–10.3)
Glucose, Bld: 119 mg/dL — ABNORMAL HIGH (ref 65–99)
Potassium: 3.7 mmol/L (ref 3.5–5.1)
Sodium: 143 mmol/L (ref 135–145)
TOTAL PROTEIN: 7.3 g/dL (ref 6.5–8.1)

## 2015-09-27 LAB — CBG MONITORING, ED: GLUCOSE-CAPILLARY: 124 mg/dL — AB (ref 65–99)

## 2015-09-27 LAB — ETHANOL: ALCOHOL ETHYL (B): 520 mg/dL — AB (ref <5)

## 2015-09-27 MED ORDER — SODIUM CHLORIDE 0.9 % IV BOLUS (SEPSIS)
500.0000 mL | Freq: Once | INTRAVENOUS | Status: AC
  Administered 2015-09-27: 500 mL via INTRAVENOUS

## 2015-09-27 NOTE — ED Notes (Addendum)
Pt's contact----- Rachel(mother): tel# [phone];  Tel# [phone]

## 2015-09-27 NOTE — ED Notes (Signed)
Bed: [facility] Expected date:  Expected time:  Means of arrival:  Comments: 
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
## 2015-09-28 ENCOUNTER — Emergency Department (HOSPITAL_COMMUNITY): Payer: Medicaid Other

## 2015-09-28 ENCOUNTER — Emergency Department (HOSPITAL_COMMUNITY)
Admission: EM | Admit: 2015-09-28 | Discharge: 2015-09-29 | Disposition: A | Discharge status: Home / Self Care | Payer: Medicaid Other | Attending: Emergency Medicine | Admitting: Emergency Medicine

## 2015-09-28 ENCOUNTER — Encounter (HOSPITAL_COMMUNITY): Payer: Self-pay | Admitting: Emergency Medicine

## 2015-09-28 DIAGNOSIS — F319 Bipolar disorder, unspecified: Secondary | ICD-10-CM | POA: Insufficient documentation

## 2015-09-28 DIAGNOSIS — Z8669 Personal history of other diseases of the nervous system and sense organs: Secondary | ICD-10-CM | POA: Insufficient documentation

## 2015-09-28 DIAGNOSIS — Z79899 Other long term (current) drug therapy: Secondary | ICD-10-CM | POA: Insufficient documentation

## 2015-09-28 DIAGNOSIS — R7401 Elevation of levels of liver transaminase levels: Secondary | ICD-10-CM

## 2015-09-28 DIAGNOSIS — F10120 Alcohol abuse with intoxication, uncomplicated: Secondary | ICD-10-CM | POA: Insufficient documentation

## 2015-09-28 DIAGNOSIS — R740 Nonspecific elevation of levels of transaminase and lactic acid dehydrogenase [LDH]: Secondary | ICD-10-CM | POA: Insufficient documentation

## 2015-09-28 DIAGNOSIS — R109 Unspecified abdominal pain: Secondary | ICD-10-CM

## 2015-09-28 DIAGNOSIS — F10920 Alcohol use, unspecified with intoxication, uncomplicated: Secondary | ICD-10-CM

## 2015-09-28 LAB — RAPID URINE DRUG SCREEN, HOSP PERFORMED
Amphetamines: NOT DETECTED
Barbiturates: NOT DETECTED
Benzodiazepines: NOT DETECTED
Cocaine: NOT DETECTED
OPIATES: NOT DETECTED
Tetrahydrocannabinol: NOT DETECTED

## 2015-09-28 MED ORDER — ONDANSETRON HCL 4 MG/2ML IJ SOLN
4.0000 mg | Freq: Once | INTRAMUSCULAR | Status: AC
  Administered 2015-09-29: 4 mg via INTRAVENOUS
  Filled 2015-09-28: qty 2

## 2015-09-28 MED ORDER — SODIUM CHLORIDE 0.9 % IV SOLN
1000.0000 mL | INTRAVENOUS | Status: DC
  Administered 2015-09-29: 1000 mL via INTRAVENOUS

## 2015-09-28 MED ORDER — SODIUM CHLORIDE 0.9 % IV SOLN
1000.0000 mL | Freq: Once | INTRAVENOUS | Status: AC
  Administered 2015-09-29: 1000 mL via INTRAVENOUS
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
## 2015-09-28 NOTE — ED Notes (Signed)
Bed: WA09 Expected date:  Expected time:  Means of arrival:  Comments: EMS 

## 2015-09-28 NOTE — ED Notes (Signed)
Pt able to ambulate to the bathroom with staff assistance.  Pt observed swaying about with his attempt to ambulate by himself; pt's speech still garbled.  Pt stated that he "does not know where he is going home" at this time.  Pt fell asleep during conversation.
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
## 2015-09-29 ENCOUNTER — Encounter (HOSPITAL_COMMUNITY): Payer: Self-pay

## 2015-09-29 ENCOUNTER — Emergency Department (HOSPITAL_COMMUNITY)
Admission: EM | Admit: 2015-09-29 | Discharge: 2015-09-29 | Disposition: A | Discharge status: Home / Self Care | Payer: Medicaid Other | Source: Home / Self Care | Attending: Emergency Medicine | Admitting: Emergency Medicine

## 2015-09-29 ENCOUNTER — Encounter (HOSPITAL_COMMUNITY): Payer: Self-pay | Admitting: Emergency Medicine

## 2015-09-29 ENCOUNTER — Emergency Department (HOSPITAL_COMMUNITY)
Admission: EM | Admit: 2015-09-29 | Discharge: 2015-09-30 | Disposition: A | Discharge status: Home / Self Care | Payer: Medicaid Other | Source: Home / Self Care | Attending: Emergency Medicine | Admitting: Emergency Medicine

## 2015-09-29 ENCOUNTER — Encounter (HOSPITAL_COMMUNITY): Payer: Self-pay | Admitting: *Deleted

## 2015-09-29 ENCOUNTER — Emergency Department (HOSPITAL_COMMUNITY): Payer: Medicaid Other

## 2015-09-29 DIAGNOSIS — F10929 Alcohol use, unspecified with intoxication, unspecified: Secondary | ICD-10-CM

## 2015-09-29 DIAGNOSIS — F319 Bipolar disorder, unspecified: Secondary | ICD-10-CM | POA: Insufficient documentation

## 2015-09-29 DIAGNOSIS — Z79899 Other long term (current) drug therapy: Secondary | ICD-10-CM | POA: Insufficient documentation

## 2015-09-29 DIAGNOSIS — F10129 Alcohol abuse with intoxication, unspecified: Secondary | ICD-10-CM

## 2015-09-29 LAB — COMPREHENSIVE METABOLIC PANEL
ALT: 75 U/L — AB (ref 17–63)
AST: 111 U/L — AB (ref 15–41)
Albumin: 3.9 g/dL (ref 3.5–5.0)
Alkaline Phosphatase: 89 U/L (ref 38–126)
Anion gap: 9 (ref 5–15)
BUN: 13 mg/dL (ref 6–20)
CHLORIDE: 113 mmol/L — AB (ref 101–111)
CO2: 24 mmol/L (ref 22–32)
CREATININE: 0.57 mg/dL — AB (ref 0.61–1.24)
Calcium: 8.1 mg/dL — ABNORMAL LOW (ref 8.9–10.3)
GFR calc Af Amer: >60 mL/min (ref >60)
Glucose, Bld: 115 mg/dL — ABNORMAL HIGH (ref 65–99)
Potassium: 3.6 mmol/L (ref 3.5–5.1)
SODIUM: 146 mmol/L — AB (ref 135–145)
Total Bilirubin: 0.5 mg/dL (ref 0.3–1.2)
Total Protein: 7.2 g/dL (ref 6.5–8.1)

## 2015-09-29 LAB — CBC WITH DIFFERENTIAL/PLATELET
Basophils Absolute: 0 10*3/uL (ref 0.0–0.1)
Basophils Relative: 0 %
EOS ABS: 0.1 10*3/uL (ref 0.0–0.7)
EOS PCT: 1 %
HCT: 38.4 % — ABNORMAL LOW (ref 39.0–52.0)
HEMOGLOBIN: 13.4 g/dL (ref 13.0–17.0)
LYMPHS ABS: 2 10*3/uL (ref 0.7–4.0)
LYMPHS PCT: 31 %
MCH: 32.6 pg (ref 26.0–34.0)
MCHC: 34.9 g/dL (ref 30.0–36.0)
MCV: 93.4 fL (ref 78.0–100.0)
MONO ABS: 0.3 10*3/uL (ref 0.1–1.0)
MONOS PCT: 5 %
Neutro Abs: 4.1 10*3/uL (ref 1.7–7.7)
Neutrophils Relative %: 63 %
PLATELETS: 328 10*3/uL (ref 150–400)
RBC: 4.11 MIL/uL — AB (ref 4.22–5.81)
RDW: 17.3 % — ABNORMAL HIGH (ref 11.5–15.5)
WBC: 6.6 10*3/uL (ref 4.0–10.5)

## 2015-09-29 LAB — URINALYSIS, ROUTINE W REFLEX MICROSCOPIC
Bilirubin Urine: NEGATIVE
GLUCOSE, UA: NEGATIVE mg/dL
HGB URINE DIPSTICK: NEGATIVE
Ketones, ur: NEGATIVE mg/dL
LEUKOCYTES UA: NEGATIVE
Nitrite: NEGATIVE
PH: 5 (ref 5.0–8.0)
PROTEIN: NEGATIVE mg/dL
Specific Gravity, Urine: 1.03 (ref 1.005–1.030)

## 2015-09-29 LAB — LIPASE, BLOOD: LIPASE: 43 U/L (ref 11–51)

## 2015-09-29 LAB — RAPID URINE DRUG SCREEN, HOSP PERFORMED
AMPHETAMINES: NOT DETECTED
BARBITURATES: NOT DETECTED
BENZODIAZEPINES: NOT DETECTED
Cocaine: NOT DETECTED
Opiates: NOT DETECTED
Tetrahydrocannabinol: NOT DETECTED

## 2015-09-29 LAB — CBG MONITORING, ED: GLUCOSE-CAPILLARY: 105 mg/dL — AB (ref 65–99)

## 2015-09-29 LAB — ETHANOL: Alcohol, Ethyl (B): 381 mg/dL (ref <5)

## 2015-09-29 MED ORDER — IOPAMIDOL (ISOVUE-300) INJECTION 61%
100.0000 mL | Freq: Once | INTRAVENOUS | Status: AC | PRN
  Administered 2015-09-29: 100 mL via INTRAVENOUS

## 2015-09-29 NOTE — ED Notes (Signed)
Patient given discharge paperwork. Patient offered drink and sandwich but refused.   Patient put discharge paperwork in bookbag.   Patient laid back down on stretcher.

## 2015-09-29 NOTE — ED Notes (Signed)
Bed: [facility] Expected date:  Expected time:  Means of arrival:  Comments: ETOH 

## 2015-09-29 NOTE — Discharge Instructions (Signed)
If you don't want your abdomen to hurt, don't drink any alcohol!  Abdominal Pain, Adult Many things can cause abdominal pain. Usually, abdominal pain is not caused by a disease and will improve without treatment. It can often be observed and treated at home. Your health care provider will do a physical exam and possibly order blood tests and X-rays to help determine the seriousness of your pain. However, in many cases, more time must pass before a clear cause of the pain can be found. Before that point, your health care provider may not know if you need more testing or further treatment. HOME CARE INSTRUCTIONS Monitor your abdominal pain for any changes. The following actions may help to alleviate any discomfort you are experiencing:  Only take over-the-counter or prescription medicines as directed by your health care provider.  Do not take laxatives unless directed to do so by your health care provider.  Try a clear liquid diet (broth, tea, or water) as directed by your health care provider. Slowly move to a bland diet as tolerated. SEEK MEDICAL CARE IF:  You have unexplained abdominal pain.  You have abdominal pain associated with nausea or diarrhea.  You have pain when you urinate or have a bowel movement.  You experience abdominal pain that wakes you in the night.  You have abdominal pain that is worsened or improved by eating food.  You have abdominal pain that is worsened with eating fatty foods.  You have a fever. SEEK IMMEDIATE MEDICAL CARE IF:  Your pain does not go away within 2 hours.  You keep throwing up (vomiting).  Your pain is felt only in portions of the abdomen, such as the right side or the left lower portion of the abdomen.  You pass bloody or black tarry stools. MAKE SURE YOU:  Understand these instructions.  Will watch your condition.  Will get help right away if you are not doing well or get worse.   This information is not intended to replace advice  given to you by your health care provider. Make sure you discuss any questions you have with your health care provider.   Document Released: 12/16/2004 Document Revised: 11/27/2014 Document Reviewed: 11/15/2012 Elsevier Interactive Patient Education 2016 [employer].   Alcohol Intoxication Alcohol intoxication occurs when the amount of alcohol that a person has consumed impairs his or her ability to mentally and physically function. Alcohol directly impairs the normal chemical activity of the brain. Drinking large amounts of alcohol can lead to changes in mental function and behavior, and it can cause many physical effects that can be harmful.  Alcohol intoxication can range in severity from mild to very severe. Various factors can affect the level of intoxication that occurs, such as the person's age, gender, weight, frequency of alcohol consumption, and the presence of other medical conditions (such as diabetes, seizures, or heart conditions). Dangerous levels of alcohol intoxication may occur when people drink large amounts of alcohol in a short period (binge drinking). Alcohol can also be especially dangerous when combined with certain prescription medicines or "recreational" drugs. SIGNS AND SYMPTOMS Some common signs and symptoms of mild alcohol intoxication include:  Loss of coordination.  Changes in mood and behavior.  Impaired judgment.  Slurred speech. As alcohol intoxication progresses to more severe levels, other signs and symptoms will appear. These may include:  Vomiting.  Confusion and impaired memory.  Slowed breathing.  Seizures.  Loss of consciousness. DIAGNOSIS  Your health care provider will take a medical  history and perform a physical exam. You will be asked about the amount and type of alcohol you have consumed. Blood tests will be done to measure the concentration of alcohol in your blood. In many places, your blood alcohol level must be lower than 80 mg/dL  (1.61%0.08%) to legally drive. However, many dangerous effects of alcohol can occur at much lower levels.  TREATMENT  People with alcohol intoxication often do not require treatment. Most of the effects of alcohol intoxication are temporary, and they go away as the alcohol naturally leaves the body. Your health care provider will monitor your condition until you are stable enough to go home. Fluids are sometimes given through an IV access tube to help prevent dehydration.  HOME CARE INSTRUCTIONS  Do not drive after drinking alcohol.  Stay hydrated. Drink enough water and fluids to keep your urine clear or pale yellow. Avoid caffeine.   Only take over-the-counter or prescription medicines as directed by your health care provider.  SEEK MEDICAL CARE IF:   You have persistent vomiting.   You do not feel better after a few days.  You have frequent alcohol intoxication. Your health care provider can help determine if you should see a substance use treatment counselor. SEEK IMMEDIATE MEDICAL CARE IF:   You become shaky or tremble when you try to stop drinking.   You shake uncontrollably (seizure).   You throw up (vomit) blood. This may be bright red or may look like black coffee grounds.   You have blood in your stool. This may be bright red or may appear as a black, tarry, bad smelling stool.   You become lightheaded or faint.  MAKE SURE YOU:   Understand these instructions.  Will watch your condition.  Will get help right away if you are not doing well or get worse.   This information is not intended to replace advice given to you by your health care provider. Make sure you discuss any questions you have with your health care provider.   Document Released: 12/16/2004 Document Revised: 11/08/2012 Document Reviewed: 08/11/2012 Elsevier Interactive Patient Education [employer]2016 Elsevier Inc.

## 2015-09-29 NOTE — ED Notes (Signed)
Patient requesting to leave.  Made Craig StanleyLisa PA aware, printed discharge paperwork.
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
## 2015-09-29 NOTE — ED Notes (Addendum)
Per Cyndie ChimeNguyen MD, Pt ok to be discharged.  Verbalized understanding discharge instructions and follow up. In no acute distress.  Pt given a bus pass.  Pt refused ice pack for eye and food/drink.

## 2015-09-29 NOTE — ED Notes (Signed)
Patient trying to get up again out of bed.  Patient redirected to lay back down and rest little while longer and can get more steady on his feet.
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
## 2015-09-29 NOTE — Discharge Instructions (Signed)
STOP DRINKING ALCOHOL. FOLLOW-UP WITH YOUR DOCTOR.   Alcohol Intoxication Alcohol intoxication occurs when the amount of alcohol that a person has consumed impairs his or her ability to mentally and physically function. Alcohol directly impairs the normal chemical activity of the brain. Drinking large amounts of alcohol can lead to changes in mental function and behavior, and it can cause many physical effects that can be harmful.  Alcohol intoxication can range in severity from mild to very severe. Various factors can affect the level of intoxication that occurs, such as the person's age, gender, weight, frequency of alcohol consumption, and the presence of other medical conditions (such as diabetes, seizures, or heart conditions). Dangerous levels of alcohol intoxication may occur when people drink large amounts of alcohol in a short period (binge drinking). Alcohol can also be especially dangerous when combined with certain prescription medicines or "recreational" drugs. SIGNS AND SYMPTOMS Some common signs and symptoms of mild alcohol intoxication include:  Loss of coordination.  Changes in mood and behavior.  Impaired judgment.  Slurred speech. As alcohol intoxication progresses to more severe levels, other signs and symptoms will appear. These may include:  Vomiting.  Confusion and impaired memory.  Slowed breathing.  Seizures.  Loss of consciousness. DIAGNOSIS  Your health care provider will take a medical history and perform a physical exam. You will be asked about the amount and type of alcohol you have consumed. Blood tests will be done to measure the concentration of alcohol in your blood. In many places, your blood alcohol level must be lower than 80 mg/dL (1.61%0.08%) to legally drive. However, many dangerous effects of alcohol can occur at much lower levels.  TREATMENT  People with alcohol intoxication often do not require treatment. Most of the effects of alcohol intoxication  are temporary, and they go away as the alcohol naturally leaves the body. Your health care provider will monitor your condition until you are stable enough to go home. Fluids are sometimes given through an IV access tube to help prevent dehydration.  HOME CARE INSTRUCTIONS  Do not drive after drinking alcohol.  Stay hydrated. Drink enough water and fluids to keep your urine clear or pale yellow. Avoid caffeine.   Only take over-the-counter or prescription medicines as directed by your health care provider.  SEEK MEDICAL CARE IF:   You have persistent vomiting.   You do not feel better after a few days.  You have frequent alcohol intoxication. Your health care provider can help determine if you should see a substance use treatment counselor. SEEK IMMEDIATE MEDICAL CARE IF:   You become shaky or tremble when you try to stop drinking.   You shake uncontrollably (seizure).   You throw up (vomit) blood. This may be bright red or may look like black coffee grounds.   You have blood in your stool. This may be bright red or may appear as a black, tarry, bad smelling stool.   You become lightheaded or faint.  MAKE SURE YOU:   Understand these instructions.  Will watch your condition.  Will get help right away if you are not doing well or get worse.   This information is not intended to replace advice given to you by your health care provider. Make sure you discuss any questions you have with your health care provider.   Document Released: 12/16/2004 Document Revised: 11/08/2012 Document Reviewed: 08/11/2012 Elsevier Interactive Patient Education [employer]2016 [employer]
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
## 2015-09-29 NOTE — ED Notes (Signed)
Patient unsteadily sat up on side of bed, states, "I want to leave, I am ready to go".  RN instructed patient that he is unsteady and don't believe it is a good idea for him to try to get up and walk at this time.  Patient demanded to leave again.  Informed patient that I would go get provider to come see him first before he would be allowed to leave.   Misty StanleyLisa Pa made aware.   Patient resting back on stretcher and not asking to leave anymore.
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
## 2015-09-30 NOTE — Discharge Instructions (Signed)
RESOURCE GUIDE  Chronic Pain Problems: Contact [facility]  [phone] Patients need to be referred by their primary care doctor.  Insufficient Money for Medicine: Contact [employer]:  call [phone]  No Primary Care Doctor: - Call [facility]  [phone] - can help you locate a primary care doctor that  accepts your insurance, provides certain services, etc. - Physician Referral Service- [phone]  Agencies that provide inexpensive medical care: - [facility]  [phone] - [facility]  [phone] - [facility]  [phone] - Women's Clinic  [phone] - [facility]  [phone] [facility]  [phone]  Medicaid-accepting [facility] Providers: - [facility]- [address]  [phone], Mon-Fri 9am-7pm, Sat 9am-1pm - [facility]- [address] [place], [address] [place]  [phone] - [facility]- [address], [address] [place]  [phone] [facility]- [address]  [phone] - Renaye Rakers- [address] [place], [address], [phone]  Only accepts [place] patients after they have their name  applied to their card  Self Pay (no insurance) in [place]: - Sickle Cell Patients - [facility]  [address] [place], [phone] - [facility]- [address] [place]  [phone]       [facility] [place]- [address]       -     [facility]- see information above (Speak to [employer] if you do not have insurance)       -  [facility] [address],  [phone]       -  [facility]- [address], [phone]       -  Dr Julio Sicks-  [address], [place], [phone]       -  [facility] - [address], [phone]       -  [facility]- [address] [phone], also  [address], [phone]       -     [facility]- [address] [place], [phone], 1st & 3rd Saturday         every month, 10am-1pm  -     [facility]   [address] [place], [place].   Phone:  [phone], Fax:  [phone]. Hours of Operation:  9 am - 6 pm, M-F.  -     [facility]   [address], [place]   Phone: [phone], Fax: [phone]. Hours of Operation:  8:30 am - 5:30 pm, M-F.    Dental Assistance If unable to pay or uninsured, contact:  [facility]. to become qualified for the adult dental clinic.  Patients with Medicaid: [facility] [phone] [address], [phone] [address] [phone]  If unable to pay, or uninsured, contact [facility] [phone] in [place], [phone] in [facility]) to become qualified for the adult dental clinic  [facility] [address] [place], [place] [address] [phone] [email or web address]  Other Proofreader Services: - [facility]- [address] [place], [place], [address], [phone], Ext. [phone], 2nd and 4th Thursday of the month at 6:30am.  10 clients each  day by appointment, can sometimes see walk-in patients if someone does not show for an appointment. [facility]- [address], [place], [place], [phone], [phone] - [facility]- [address], [place], [place], [phone], [phone] [facility]- [facility]- [phone] [facility]- [facility]- [phone] [facility]- [phone]- [phone]     Alcohol Intoxication Alcohol intoxication occurs when you drink enough alcohol that it affects your ability to function. It can be mild or very severe. Drinking a lot of alcohol in a short time is called binge drinking. This can be very harmful. Drinking alcohol can also be more dangerous if you are  taking medicines or other drugs. Some of the effects caused by alcohol may include:  Loss of coordination.  Changes in mood and behavior.  Unclear thinking.  Trouble talking (slurred speech).  Throwing up (vomiting).  Confusion.  Slowed breathing.  Twitching and shaking (seizures).  Loss of consciousness. HOME CARE  Do not drive after drinking alcohol.  Drink enough water and fluids to keep your pee (urine) clear or pale yellow. Avoid caffeine.  Only take medicine as told by your doctor. GET HELP IF:  You throw up (vomit) many times.  You do not feel better after a few days.  You frequently have alcohol intoxication. Your doctor can help decide if you should see a substance use treatment counselor. GET HELP RIGHT AWAY IF:  You become shaky when you stop drinking.  You have twitching and shaking.  You throw up blood. It may look bright red or like coffee grounds.  You notice blood in your poop (bowel movements).  You become lightheaded or pass out (faint). MAKE SURE YOU:   Understand these instructions.  Will watch your condition.  Will get help right away if you are not doing well or get worse.   This information is not intended to replace advice given to you by your health care provider. Make sure you discuss any questions you have with your health care provider.   Document Released: 08/25/2007 Document Revised: 11/08/2012 Document Reviewed: 08/11/2012 Elsevier Interactive Patient Education 2016 [employer]  Alcohol Intoxication Alcohol intoxication occurs when the amount of alcohol that a person has consumed impairs his or her ability to mentally and physically function. Alcohol directly impairs the normal chemical activity of the brain. Drinking large amounts of alcohol can lead to changes in mental function and behavior, and it can cause many physical effects that can be harmful.  Alcohol intoxication can range in severity from mild to very severe.  Various factors can affect the level of intoxication that occurs, such as the person's age, gender, weight, frequency of alcohol consumption, and the presence of other medical conditions (such as diabetes, seizures, or heart conditions). Dangerous levels of alcohol intoxication may occur when people drink large amounts of alcohol in a short period (binge drinking). Alcohol can also be especially dangerous when combined with certain prescription medicines or "recreational" drugs. SIGNS AND SYMPTOMS Some common signs and symptoms of mild alcohol intoxication include:  Loss of coordination.  Changes in mood and behavior.  Impaired judgment.  Slurred speech. As alcohol intoxication progresses to more severe levels, other signs and symptoms will appear. These may include:  Vomiting.  Confusion and impaired memory.  Slowed breathing.  Seizures.  Loss of consciousness. DIAGNOSIS  Your health care provider will take a medical history and perform a physical exam. You will be asked about the amount and type of  alcohol you have consumed. Blood tests will be done to measure the concentration of alcohol in your blood. In many places, your blood alcohol level must be lower than 80 mg/dL (2.95%) to legally drive. However, many dangerous effects of alcohol can occur at much lower levels.  TREATMENT  People with alcohol intoxication often do not require treatment. Most of the effects of alcohol intoxication are temporary, and they go away as the alcohol naturally leaves the body. Your health care provider will monitor your condition until you are stable enough to go home. Fluids are sometimes given through an IV access tube to help prevent dehydration.  HOME CARE INSTRUCTIONS  Do not drive after drinking alcohol.  Stay hydrated. Drink enough water and fluids to keep your urine clear or pale yellow. Avoid caffeine.   Only take over-the-counter or prescription medicines as directed by your health care  provider.  SEEK MEDICAL CARE IF:   You have persistent vomiting.   You do not feel better after a few days.  You have frequent alcohol intoxication. Your health care provider can help determine if you should see a substance use treatment counselor. SEEK IMMEDIATE MEDICAL CARE IF:   You become shaky or tremble when you try to stop drinking.   You shake uncontrollably (seizure).   You throw up (vomit) blood. This may be bright red or may look like black coffee grounds.   You have blood in your stool. This may be bright red or may appear as a black, tarry, bad smelling stool.   You become lightheaded or faint.  MAKE SURE YOU:   Understand these instructions.  Will watch your condition.  Will get help right away if you are not doing well or get worse.   This information is not intended to replace advice given to you by your health care provider. Make sure you discuss any questions you have with your health care provider.   Document Released: 12/16/2004 Document Revised: 11/08/2012 Document Reviewed: 08/11/2012 Elsevier Interactive Patient Education [employer]
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
## 2015-10-16 ENCOUNTER — Emergency Department (HOSPITAL_COMMUNITY)
Admission: EM | Admit: 2015-10-16 | Discharge: 2015-10-17 | Payer: Medicaid Other | Attending: Emergency Medicine | Admitting: Emergency Medicine

## 2015-10-16 ENCOUNTER — Emergency Department (HOSPITAL_COMMUNITY): Payer: Medicaid Other

## 2015-10-16 ENCOUNTER — Encounter (HOSPITAL_COMMUNITY): Payer: Self-pay | Admitting: Emergency Medicine

## 2015-10-16 DIAGNOSIS — F10129 Alcohol abuse with intoxication, unspecified: Secondary | ICD-10-CM

## 2015-10-16 DIAGNOSIS — F310 Bipolar disorder, current episode hypomanic: Secondary | ICD-10-CM

## 2015-10-16 DIAGNOSIS — Z5181 Encounter for therapeutic drug level monitoring: Secondary | ICD-10-CM | POA: Insufficient documentation

## 2015-10-16 DIAGNOSIS — F10929 Alcohol use, unspecified with intoxication, unspecified: Secondary | ICD-10-CM

## 2015-10-16 DIAGNOSIS — R4182 Altered mental status, unspecified: Secondary | ICD-10-CM | POA: Insufficient documentation

## 2015-10-16 DIAGNOSIS — F1020 Alcohol dependence, uncomplicated: Secondary | ICD-10-CM

## 2015-10-16 DIAGNOSIS — R45850 Homicidal ideations: Secondary | ICD-10-CM | POA: Diagnosis present

## 2015-10-16 LAB — COMPREHENSIVE METABOLIC PANEL
ALK PHOS: 60 U/L (ref 38–126)
ALT: 29 U/L (ref 17–63)
ANION GAP: 8 (ref 5–15)
AST: 38 U/L (ref 15–41)
Albumin: 4.1 g/dL (ref 3.5–5.0)
BILIRUBIN TOTAL: 0.4 mg/dL (ref 0.3–1.2)
BUN: 7 mg/dL (ref 6–20)
CALCIUM: 8 mg/dL — AB (ref 8.9–10.3)
CO2: 21 mmol/L — ABNORMAL LOW (ref 22–32)
Chloride: 119 mmol/L — ABNORMAL HIGH (ref 101–111)
Creatinine, Ser: 0.64 mg/dL (ref 0.61–1.24)
Glucose, Bld: 101 mg/dL — ABNORMAL HIGH (ref 65–99)
POTASSIUM: 3.6 mmol/L (ref 3.5–5.1)
Sodium: 148 mmol/L — ABNORMAL HIGH (ref 135–145)
TOTAL PROTEIN: 7.2 g/dL (ref 6.5–8.1)

## 2015-10-16 LAB — CBC WITH DIFFERENTIAL/PLATELET
BASOS ABS: 0 10*3/uL (ref 0.0–0.1)
BASOS PCT: 0 %
Eosinophils Absolute: 0.1 10*3/uL (ref 0.0–0.7)
Eosinophils Relative: 2 %
HEMATOCRIT: 35.9 % — AB (ref 39.0–52.0)
HEMOGLOBIN: 11.9 g/dL — AB (ref 13.0–17.0)
Lymphocytes Relative: 38 %
Lymphs Abs: 2.6 10*3/uL (ref 0.7–4.0)
MCH: 31.9 pg (ref 26.0–34.0)
MCHC: 33.1 g/dL (ref 30.0–36.0)
MCV: 96.2 fL (ref 78.0–100.0)
MONO ABS: 0.5 10*3/uL (ref 0.1–1.0)
Monocytes Relative: 8 %
NEUTROS ABS: 3.7 10*3/uL (ref 1.7–7.7)
NEUTROS PCT: 52 %
Platelets: 300 10*3/uL (ref 150–400)
RBC: 3.73 MIL/uL — AB (ref 4.22–5.81)
RDW: 15.8 % — AB (ref 11.5–15.5)
WBC: 7 10*3/uL (ref 4.0–10.5)

## 2015-10-16 LAB — LIPASE, BLOOD: LIPASE: 108 U/L — AB (ref 11–51)

## 2015-10-16 LAB — ETHANOL: Alcohol, Ethyl (B): 374 mg/dL (ref <5)

## 2015-10-16 MED ORDER — LORAZEPAM 1 MG PO TABS: 0.0000 mg | ORAL_TABLET | Freq: Two times a day (BID) | ORAL | Status: DC

## 2015-10-16 MED ORDER — THIAMINE HCL 100 MG/ML IJ SOLN: 100.0000 mg | Freq: Every day | INTRAMUSCULAR | Status: DC

## 2015-10-16 MED ORDER — LORAZEPAM 1 MG PO TABS
0.0000 mg | ORAL_TABLET | Freq: Four times a day (QID) | ORAL | Status: DC
  Administered 2015-10-16 – 2015-10-17 (×3): 1 mg via ORAL
  Filled 2015-10-16 (×3): qty 1

## 2015-10-16 MED ORDER — ZIPRASIDONE MESYLATE 20 MG IM SOLR
10.0000 mg | Freq: Once | INTRAMUSCULAR | Status: AC
  Administered 2015-10-16: 10 mg via INTRAMUSCULAR
  Filled 2015-10-16: qty 20

## 2015-10-16 MED ORDER — VITAMIN B-1 100 MG PO TABS
100.0000 mg | ORAL_TABLET | Freq: Every day | ORAL | Status: DC
  Administered 2015-10-17: 100 mg via ORAL
  Filled 2015-10-16: qty 1

## 2015-10-16 MED ORDER — STERILE WATER FOR INJECTION IJ SOLN
INTRAMUSCULAR | Status: AC
  Filled 2015-10-16: qty 10

## 2015-10-16 NOTE — Progress Notes (Addendum)
Patient noted to have been seen in the ED 10 times within the last six months.  Patient with ETOH abuse, Bipolar, history of withdrawal seizures. Patient having HI Patient listed as having Medicaid [place] Access insurance. Patient does not qualify for medication assistance through [facility] due to insurance status. Patient does not qualify for [facility] due to insurance status. No further EDCM needs at this time. Per chart review patient's therapist at [facility] is Dennison Nancy [phone].

## 2015-10-16 NOTE — ED Notes (Signed)
Pt's girlfriend, Archie Patten, called. Requesting to be called if needed [phone].

## 2015-10-16 NOTE — ED Notes (Signed)
Family at bedside. 

## 2015-10-16 NOTE — ED Triage Notes (Addendum)
Pt has been drinking etoh since last night, last drink this am. Craig [name] to see therapist this am and expressed HI towards therapist and others. Pt denies SI/HI at present. Pt also reports he has been off his medications

## 2015-10-16 NOTE — ED Triage Notes (Signed)
Pt resting with eyes closed, even resp, skin warm and dry, sitter present. Continue to monitor status.
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
## 2015-10-16 NOTE — ED Notes (Addendum)
Pt reports he wants to leave AMA. Informed MD who is going to fill out IVC papers.

## 2015-10-16 NOTE — ED Notes (Signed)
Therapist Dennison Nancy [phone] with [facility] left phone number.

## 2015-10-16 NOTE — ED Notes (Signed)
Pt's mother, Fleet Contras, called and may be reached at [phone] for any needs or updates.

## 2015-10-17 ENCOUNTER — Encounter (HOSPITAL_COMMUNITY): Payer: Self-pay | Admitting: Licensed Clinical Social Worker

## 2015-10-17 ENCOUNTER — Encounter (HOSPITAL_COMMUNITY): Payer: Self-pay

## 2015-10-17 ENCOUNTER — Observation Stay (HOSPITAL_COMMUNITY)
Admission: AD | Admit: 2015-10-17 | Discharge: 2015-10-18 | Disposition: A | Discharge status: Home / Self Care | Payer: Medicaid Other | Source: Intra-hospital | Attending: Psychiatry | Admitting: Psychiatry

## 2015-10-17 DIAGNOSIS — Z885 Allergy status to narcotic agent status: Secondary | ICD-10-CM | POA: Insufficient documentation

## 2015-10-17 DIAGNOSIS — R45850 Homicidal ideations: Secondary | ICD-10-CM | POA: Diagnosis present

## 2015-10-17 DIAGNOSIS — Y908 Blood alcohol level of 240 mg/100 ml or more: Secondary | ICD-10-CM | POA: Diagnosis not present

## 2015-10-17 DIAGNOSIS — Z888 Allergy status to other drugs, medicaments and biological substances status: Secondary | ICD-10-CM | POA: Diagnosis not present

## 2015-10-17 DIAGNOSIS — F310 Bipolar disorder, current episode hypomanic: Secondary | ICD-10-CM | POA: Diagnosis not present

## 2015-10-17 DIAGNOSIS — F419 Anxiety disorder, unspecified: Secondary | ICD-10-CM | POA: Diagnosis not present

## 2015-10-17 DIAGNOSIS — F10229 Alcohol dependence with intoxication, unspecified: Secondary | ICD-10-CM | POA: Insufficient documentation

## 2015-10-17 DIAGNOSIS — F3130 Bipolar disorder, current episode depressed, mild or moderate severity, unspecified: Secondary | ICD-10-CM | POA: Diagnosis present

## 2015-10-17 LAB — URINALYSIS, ROUTINE W REFLEX MICROSCOPIC
BILIRUBIN URINE: NEGATIVE
Glucose, UA: NEGATIVE mg/dL
Hgb urine dipstick: NEGATIVE
Ketones, ur: NEGATIVE mg/dL
Leukocytes, UA: NEGATIVE
NITRITE: NEGATIVE
PH: 5 (ref 5.0–8.0)
Protein, ur: NEGATIVE mg/dL
SPECIFIC GRAVITY, URINE: 1.015 (ref 1.005–1.030)

## 2015-10-17 LAB — RAPID URINE DRUG SCREEN, HOSP PERFORMED
AMPHETAMINES: NOT DETECTED
BENZODIAZEPINES: POSITIVE — AB
Barbiturates: NOT DETECTED
Cocaine: NOT DETECTED
OPIATES: NOT DETECTED
Tetrahydrocannabinol: NOT DETECTED

## 2015-10-17 MED ORDER — ACETAMINOPHEN 325 MG PO TABS: 650.0000 mg | ORAL_TABLET | Freq: Four times a day (QID) | ORAL | Status: DC | PRN

## 2015-10-17 MED ORDER — TRAZODONE HCL 100 MG PO TABS
100.0000 mg | ORAL_TABLET | Freq: Every day | ORAL | Status: DC
  Administered 2015-10-17: 100 mg via ORAL
  Filled 2015-10-17 (×2): qty 1

## 2015-10-17 MED ORDER — LORAZEPAM 1 MG PO TABS: 1.0000 mg | ORAL_TABLET | Freq: Two times a day (BID) | ORAL | Status: DC

## 2015-10-17 MED ORDER — ONDANSETRON 4 MG PO TBDP: 4.0000 mg | ORAL_TABLET | Freq: Four times a day (QID) | ORAL | Status: DC | PRN

## 2015-10-17 MED ORDER — LORAZEPAM 1 MG PO TABS
1.0000 mg | ORAL_TABLET | Freq: Four times a day (QID) | ORAL | Status: AC
  Administered 2015-10-17 (×2): 1 mg via ORAL
  Filled 2015-10-17 (×2): qty 1

## 2015-10-17 MED ORDER — QUETIAPINE FUMARATE 25 MG PO TABS
25.0000 mg | ORAL_TABLET | Freq: Three times a day (TID) | ORAL | Status: DC
  Administered 2015-10-17 – 2015-10-18 (×2): 25 mg via ORAL
  Filled 2015-10-17 (×2): qty 1
  Filled 2015-10-17: qty 3

## 2015-10-17 MED ORDER — VITAMIN B-1 100 MG PO TABS
100.0000 mg | ORAL_TABLET | Freq: Every day | ORAL | Status: DC
  Administered 2015-10-18: 100 mg via ORAL
  Filled 2015-10-17: qty 1

## 2015-10-17 MED ORDER — ALUM & MAG HYDROXIDE-SIMETH 200-200-20 MG/5ML PO SUSP: 30.0000 mL | ORAL | Status: DC | PRN

## 2015-10-17 MED ORDER — THIAMINE HCL 100 MG/ML IJ SOLN: 100.0000 mg | Freq: Once | INTRAMUSCULAR | Status: DC

## 2015-10-17 MED ORDER — LORAZEPAM 1 MG PO TABS: 1.0000 mg | ORAL_TABLET | Freq: Every day | ORAL | Status: DC

## 2015-10-17 MED ORDER — LORAZEPAM 1 MG PO TABS
1.0000 mg | ORAL_TABLET | Freq: Three times a day (TID) | ORAL | Status: DC
  Administered 2015-10-18: 1 mg via ORAL
  Filled 2015-10-17: qty 1

## 2015-10-17 MED ORDER — MAGNESIUM HYDROXIDE 400 MG/5ML PO SUSP: 30.0000 mL | Freq: Every day | ORAL | Status: DC | PRN

## 2015-10-17 MED ORDER — GABAPENTIN 100 MG PO CAPS
200.0000 mg | ORAL_CAPSULE | Freq: Three times a day (TID) | ORAL | Status: DC
  Administered 2015-10-17 – 2015-10-18 (×2): 200 mg via ORAL
  Filled 2015-10-17: qty 2
  Filled 2015-10-17: qty 6
  Filled 2015-10-17: qty 2

## 2015-10-17 MED ORDER — LORAZEPAM 1 MG PO TABS: 0.0000 mg | ORAL_TABLET | Freq: Two times a day (BID) | ORAL | Status: DC

## 2015-10-17 MED ORDER — LORAZEPAM 1 MG PO TABS: 0.0000 mg | ORAL_TABLET | Freq: Four times a day (QID) | ORAL | Status: DC

## 2015-10-17 MED ORDER — HYDROXYZINE HCL 25 MG PO TABS
25.0000 mg | ORAL_TABLET | Freq: Four times a day (QID) | ORAL | Status: DC | PRN
  Filled 2015-10-17: qty 1

## 2015-10-17 MED ORDER — LOPERAMIDE HCL 2 MG PO CAPS: 2.0000 mg | ORAL_CAPSULE | ORAL | Status: DC | PRN

## 2015-10-17 MED ORDER — CLONIDINE HCL 0.2 MG PO TABS
0.2000 mg | ORAL_TABLET | Freq: Two times a day (BID) | ORAL | Status: DC
  Administered 2015-10-17 – 2015-10-18 (×2): 0.2 mg via ORAL
  Filled 2015-10-17 (×6): qty 2

## 2015-10-17 MED ORDER — TRAZODONE HCL 100 MG PO TABS: 100.0000 mg | ORAL_TABLET | Freq: Every evening | ORAL | Status: DC

## 2015-10-17 MED ORDER — THIAMINE HCL 100 MG/ML IJ SOLN: 100.0000 mg | Freq: Every day | INTRAMUSCULAR | Status: DC

## 2015-10-17 MED ORDER — VITAMIN B-1 100 MG PO TABS: 100.0000 mg | ORAL_TABLET | Freq: Every day | ORAL | Status: DC

## 2015-10-17 MED ORDER — ADULT MULTIVITAMIN W/MINERALS CH
1.0000 | ORAL_TABLET | Freq: Every day | ORAL | Status: DC
  Administered 2015-10-17 – 2015-10-18 (×2): 1 via ORAL
  Filled 2015-10-17 (×2): qty 1

## 2015-10-17 NOTE — Progress Notes (Signed)
Craig [name] is a 55 year old male being admitted voluntarily to OBS unit bed 2 from [facility].  He came in from the [facility] program after relapsing on 10/17/15 and making threatening statements to his therapist.  He has been off his medication for 5 months as he wasn't able to make his monthly appointments because he lived in [place].  Since being off medication, his symptoms have returned and he if finding it hard to maintain is sobriety.  He reports that he has been using alcohol but averages 2 bottles of wine every 2-3 days.  "I am mainly using it for sleep because I'm not taking medication."  He denies SI/HI at this time.  He voiced no A/V hallucinations.  Belongings searched and placed in locker 31.  Skin assessment completed and noted lesion under left eye (pt states that it is basal cell carcinoma and was following up with [facility]) and multiple tattoos.  Oriented him to the OBS unit. He is diagnosed with Major Depressive Disorder, recurrent and Alcohol Abuse Disorder.

## 2015-10-17 NOTE — H&P (Signed)
[facility] Provider Admission PAA/H&P  Patient Identification: Craig [name] MRN:  [id number] Date of Evaluation:  10/17/2015 Chief Complaint:  Patient states "I really need to get back on my Bipolar medications."  Principal Diagnosis: Bipolar affective disorder, current episode hypomanic [facility]) Diagnosis:   Patient Active Problem List   Diagnosis Date Noted  . Bipolar affective disorder, current episode depressed ([place]) [F31.30] 10/17/2015  . Alcohol abuse with intoxication ([place]) [F10.129] 06/09/2012    Class: Acute  . Alcohol dependence ([place]) [F10.20] 06/09/2012    Class: Chronic  . Bipolar affective disorder, current episode hypomanic ([place]) [F31.0] 06/09/2012    Class: Chronic   History of Present Illness:   Craig [name] is an 55 y.o. male that presented to [facility] under IVC initiated by Ralene Bathe MD EDP on admission after patient made threats to his therapist. Patient had homicidal ideation on admission threatening his therapist at "[facility]" an inpatient facility where patient has resided for the last month. Patient stated he had been maintaining his sobriety for the over a month but relapsed on 10/17/15 while being in treatment due to severe anxiety. Patient did admit to making threats to his counselor when impaired but denied any plan. Patient denies any H/I or S/I at the time of the assessment. Patient stated he has been "off and on his medications" for the last 5 months. Patient stated he has been receiving outpatient services from [facility] since January 2017. Patient states he has the desire to "get back on the right medications." When asked what medications he is referring to patient stated "Klonopin, Adderall, and Latuda. I did really well on those. I have been Bipolar thirty years and have tried everything. Most of them gave me side effects. I am very frustrated that nobody will give me those medications. I don't think I would drink if I got them."  Initial  notes report patient was seeking help with depression but patient reports [place] history of Bipolar Disorder. He reports that he relapsed due to not being able to sleep at night, excessive worry, and racing thoughts. Craig [name] is very concerned about whether he will be able to go back to his treatment program. He remembers calling the counselor at the program but does not remember making threats "I had drank a bottle of wine." Per review of the chart the patient has multiple past visits for alcohol intoxication recently. Craig [name] was last inpatient at [facility] in 2014 on the 300 hall at which time he was stabilized on Neurontin, Vistaril, and Seroquel. On admission his blood alcohol level was 374 and urine was positive for benzos. Patient informed that his symptoms would be managed with non-addictive medications due to his Daniely history of alcohol abuse.    Associated Signs/Symptoms: Depression Symptoms:  depressed mood, insomnia, psychomotor agitation, difficulty concentrating, hopelessness, (Hypo) Manic Symptoms:  Distractibility, Elevated Mood, Irritable Mood, Labiality of Mood, Anxiety Symptoms:  Excessive Worry, Panic Symptoms, Psychotic Symptoms:  Denies PTSD Symptoms: Negative Total Time spent with patient: 30 minutes  Past Psychiatric History: Bipolar disorder diagnosed "30 years ago"  Is the patient at risk to self? No.  Has the patient been a risk to self in the past 6 months? No.  Has the patient been a risk to self within the distant past? No.  Is the patient a risk to others? No.  Has the patient been a risk to others in the past 6 months? No.  Has the patient been a risk to others  within the distant past? No.   Prior Inpatient Therapy:   yes  Prior Outpatient Therapy:  yes  Alcohol Screening: 1. How often do you have a drink containing alcohol?: 4 or more times a week 2. How many drinks containing alcohol do you have on a typical day when you are drinking?: 5 or 6 3. How often do you  have six or more drinks on one occasion?: Weekly Preliminary Score: 5 4. How often during the last year have you found that you were not able to stop drinking once you had started?: Less than monthly 5. How often during the last year have you failed to do what was normally expected from you becasue of drinking?: Less than monthly 7. How often during the last year have you had a feeling of guilt of remorse after drinking?: Less than monthly 8. How often during the last year have you been unable to remember what happened the night before because you had been drinking?: Monthly 9. Have you or someone else been injured as a result of your drinking?: No 10. Has a relative or friend or a doctor or another health worker been concerned about your drinking or suggested you cut down?: Yes, but not in the last year Alcohol Use Disorder Identification Test Final Score (AUDIT): 16 Brief Intervention: Patient declined brief intervention Substance Abuse History in the last 12 months:  Yes.   Consequences of Substance Abuse: Multiple ED visits, increased anxiety  Previous Psychotropic Medications: Yes  Psychological Evaluations: No  Past Medical History:  Past Medical History:  Diagnosis Date  . Back pain   . Bipolar 1 disorder ([place])   . ETOH abuse   . Neck pain   . Pancreatitis   . Withdrawal seizures ([place])    History reviewed. No pertinent surgical history. Family History:  Family History  Problem Relation Age of Onset  . Hypertension Mother    Family Psychiatric History: Maternal grandmother with Bipolar Disorder Tobacco Screening: Denies Social History:  History  Alcohol Use  . Yes    Comment: daily     History  Drug Use No    Additional Social History:      Pain Medications: See MAR Prescriptions: See MAR Over the Counter: See MAR History of alcohol / drug use?: Yes Longest period of sobriety (when/how Craig [name]): 3 yrs Negative Consequences of Use: Financial Withdrawal Symptoms:  Agitation, Weakness, Tremors Name of Substance 1: Alcohol 1 - Age of First Use: 29 1 - Amount (size/oz): 2 bottles of wine 1 - Frequency: every 2-3 days 1 - Duration: Last 20 years-on and off 1 - Last Use / Amount: 10/16/15 patient reported consuming 2 bottles of wine and "a few beers"                  Allergies:   Allergies  Allergen Reactions  . Haldol [Haloperidol] Anaphylaxis  . Thorazine  [Chlorpromazine] Anaphylaxis    Made tongueAnd unable to breathe  . Hydrocodone Itching  . Lithium Other (See Comments)    Unknown - "did something to my bloodwork"   Lab Results:  Results for orders placed or performed during the hospital encounter of 10/16/15 (from the past 48 hour(s))  Comprehensive metabolic panel     Status: Abnormal   Collection Time: 10/16/15  2:15 PM  Result Value Ref Range   Sodium 148 (H) 135 - 145 mmol/L   Potassium 3.6 3.5 - 5.1 mmol/L   Chloride 119 (H) 101 - 111 mmol/L  CO2 21 (L) 22 - 32 mmol/L   Glucose, Bld 101 (H) 65 - 99 mg/dL   BUN 7 6 - 20 mg/dL   Creatinine, Ser 0.64 0.61 - 1.24 mg/dL   Calcium 8.0 (L) 8.9 - 10.3 mg/dL   Total Protein 7.2 6.5 - 8.1 g/dL   Albumin 4.1 3.5 - 5.0 g/dL   AST 38 15 - 41 U/L   ALT 29 17 - 63 U/L   Alkaline Phosphatase 60 38 - 126 U/L   Total Bilirubin 0.4 0.3 - 1.2 mg/dL   GFR calc non Af Amer >60 >60 mL/min   GFR calc Af Amer >60 >60 mL/min    Comment: (NOTE) The eGFR has been calculated using the CKD EPI equation. This calculation has not been validated in all clinical situations. eGFR's persistently <60 mL/min signify possible Chronic Kidney Disease.    Anion gap 8 5 - 15  CBC with Differential     Status: Abnormal   Collection Time: 10/16/15  2:15 PM  Result Value Ref Range   WBC 7.0 4.0 - 10.5 K/uL   RBC 3.73 (L) 4.22 - 5.81 MIL/uL   Hemoglobin 11.9 (L) 13.0 - 17.0 g/dL   HCT 35.9 (L) 39.0 - 52.0 %   MCV 96.2 78.0 - 100.0 fL   MCH 31.9 26.0 - 34.0 pg   MCHC 33.1 30.0 - 36.0 g/dL   RDW 15.8  (H) 11.5 - 15.5 %   Platelets 300 150 - 400 K/uL   Neutrophils Relative % 52 %   Neutro Abs 3.7 1.7 - 7.7 K/uL   Lymphocytes Relative 38 %   Lymphs Abs 2.6 0.7 - 4.0 K/uL   Monocytes Relative 8 %   Monocytes Absolute 0.5 0.1 - 1.0 K/uL   Eosinophils Relative 2 %   Eosinophils Absolute 0.1 0.0 - 0.7 K/uL   Basophils Relative 0 %   Basophils Absolute 0.0 0.0 - 0.1 K/uL  Ethanol     Status: Abnormal   Collection Time: 10/16/15  2:15 PM  Result Value Ref Range   Alcohol, Ethyl (B) 374 (HH) <5 mg/dL    Comment:        LOWEST DETECTABLE LIMIT FOR SERUM ALCOHOL IS 5 mg/dL FOR MEDICAL PURPOSES ONLY CRITICAL RESULT CALLED TO, READ BACK BY AND VERIFIED WITH: KEFFLER,R. RN '@1525'  ON 7.27.17 BY MCCOY,N.   Lipase, blood     Status: Abnormal   Collection Time: 10/16/15  2:15 PM  Result Value Ref Range   Lipase 108 (H) 11 - 51 U/L  Urine rapid drug screen (hosp performed)     Status: Abnormal   Collection Time: 10/17/15  2:37 AM  Result Value Ref Range   Opiates NONE DETECTED NONE DETECTED   Cocaine NONE DETECTED NONE DETECTED   Benzodiazepines POSITIVE (A) NONE DETECTED   Amphetamines NONE DETECTED NONE DETECTED   Tetrahydrocannabinol NONE DETECTED NONE DETECTED   Barbiturates NONE DETECTED NONE DETECTED    Comment:        DRUG SCREEN FOR MEDICAL PURPOSES ONLY.  IF CONFIRMATION IS NEEDED FOR ANY PURPOSE, NOTIFY LAB WITHIN 5 DAYS.        LOWEST DETECTABLE LIMITS FOR URINE DRUG SCREEN Drug Class       Cutoff (ng/mL) Amphetamine      1000 Barbiturate      200 Benzodiazepine   497 Tricyclics       026 Opiates          300 Cocaine  300 THC              50   Urinalysis, Routine w reflex microscopic     Status: None   Collection Time: 10/17/15  2:37 AM  Result Value Ref Range   Color, Urine YELLOW YELLOW   APPearance CLEAR CLEAR   Specific Gravity, Urine 1.015 1.005 - 1.030   pH 5.0 5.0 - 8.0   Glucose, UA NEGATIVE NEGATIVE mg/dL   Hgb urine dipstick NEGATIVE NEGATIVE    Bilirubin Urine NEGATIVE NEGATIVE   Ketones, ur NEGATIVE NEGATIVE mg/dL   Protein, ur NEGATIVE NEGATIVE mg/dL   Nitrite NEGATIVE NEGATIVE   Leukocytes, UA NEGATIVE NEGATIVE    Comment: MICROSCOPIC NOT DONE ON URINES WITH NEGATIVE PROTEIN, BLOOD, LEUKOCYTES, NITRITE, OR GLUCOSE <1000 mg/dL.    Blood Alcohol level:  Lab Results  Component Value Date   ETH 374 (HH) 10/16/2015   ETH 381 (HH) 78/29/5621    Metabolic Disorder Labs:  No results found for: HGBA1C, MPG No results found for: PROLACTIN No results found for: CHOL, TRIG, HDL, CHOLHDL, VLDL, LDLCALC  Current Medications: Current Facility-Administered Medications  Medication Dose Route Frequency Provider Last Rate Last Dose  . acetaminophen (TYLENOL) tablet 650 mg  650 mg Oral Q6H PRN Patrecia Pour, NP      . alum & mag hydroxide-simeth (MAALOX/MYLANTA) 200-200-20 MG/5ML suspension 30 mL  30 mL Oral Q4H PRN Patrecia Pour, NP      . cloNIDine (CATAPRES) tablet 0.2 mg  0.2 mg Oral BID Patrecia Pour, NP   0.2 mg at 10/17/15 1649  . gabapentin (NEURONTIN) capsule 200 mg  200 mg Oral TID Niel Hummer, NP   200 mg at 10/17/15 1649  . hydrOXYzine (ATARAX/VISTARIL) tablet 25 mg  25 mg Oral Q6H PRN Hampton Abbot, MD      . loperamide (IMODIUM) capsule 2-4 mg  2-4 mg Oral PRN Hampton Abbot, MD      . LORazepam (ATIVAN) tablet 1 mg  1 mg Oral QID Hampton Abbot, MD   1 mg at 10/17/15 1649   Followed by  . [START ON 10/18/2015] LORazepam (ATIVAN) tablet 1 mg  1 mg Oral TID Hampton Abbot, MD       Followed by  . [START ON 10/19/2015] LORazepam (ATIVAN) tablet 1 mg  1 mg Oral BID Hampton Abbot, MD       Followed by  . [START ON 10/20/2015] LORazepam (ATIVAN) tablet 1 mg  1 mg Oral Daily Hampton Abbot, MD      . magnesium hydroxide (MILK OF MAGNESIA) suspension 30 mL  30 mL Oral Daily PRN Patrecia Pour, NP      . multivitamin with minerals tablet 1 tablet  1 tablet Oral Daily Hampton Abbot, MD   1 tablet at 10/17/15 1649  . ondansetron  (ZOFRAN-ODT) disintegrating tablet 4 mg  4 mg Oral Q6H PRN Hampton Abbot, MD      . QUEtiapine (SEROQUEL) tablet 25 mg  25 mg Oral TID Niel Hummer, NP   25 mg at 10/17/15 1652  . thiamine (B-1) injection 100 mg  100 mg Intramuscular Once Hampton Abbot, MD      . Derrill Memo ON 10/18/2015] thiamine (VITAMIN B-1) tablet 100 mg  100 mg Oral Daily Hampton Abbot, MD      . traZODone (DESYREL) tablet 100 mg  100 mg Oral QHS Niel Hummer, NP       PTA Medications: Prescriptions Prior to Admission  Medication Sig  Dispense Refill Last Dose  . cloNIDine (CATAPRES) 0.2 MG tablet Take 0.2 mg by mouth 2 (two) times daily. Reported on 08/28/2015   Not Taking at Unknown time  . folic acid (FOLVITE) 1 MG tablet Take 1 mg by mouth daily. Reported on 08/28/2015   Not Taking at Unknown time  . Multiple Vitamin (MULTI-VITAMINS) TABS Take 1 tablet by mouth daily. Reported on 08/28/2015   Not Taking at Unknown time  . potassium chloride SA (K-DUR,KLOR-CON) 20 MEQ tablet One (1) po bid x 3 days, then one (1) po once a day (Patient not taking: Reported on 09/29/2015) 20 tablet 0 Not Taking at Unknown time  . thiamine 100 MG tablet Take 100 mg by mouth daily. Reported on 08/28/2015   Not Taking at Unknown time  . traZODone (DESYREL) 100 MG tablet Take 100 mg by mouth every evening. Reported on 08/28/2015   Not Taking at Unknown time    Musculoskeletal: Strength & Muscle Tone: within normal limits Gait & Station: normal Patient leans: N/A  Psychiatric Specialty Exam: Physical Exam  Review of Systems  Psychiatric/Behavioral: Positive for depression and substance abuse. Negative for hallucinations, memory loss and suicidal ideas. The patient is nervous/anxious and has insomnia.     Blood pressure (!) 139/97, pulse (!) 101, temperature 98.8 F (37.1 C), temperature source Oral, resp. rate 18, height '6\' 1"'  (1.854 m), weight 97.8 kg (215 lb 8 oz), SpO2 100 %.Body mass index is 28.43 kg/m.  General Appearance: Disheveled  Eye  Contact:  Good  Speech:  Clear and Coherent  Volume:  Normal  Mood:  Anxious and Depressed  Affect:  Congruent  Thought Process:  Coherent  Orientation:  Full (Time, Place, and Person)  Thought Content:  Symptoms, worries, concerns  Suicidal Thoughts:  No  Homicidal Thoughts:  No  Memory:  Immediate;   Good Recent;   Good Remote;   Fair  Judgement:  Poor  Insight:  Shallow  Psychomotor Activity:  Restlessness  Concentration:  Concentration: Fair and Attention Span: Fair  Recall:  Good  Fund of Knowledge:  Good  Language:  Good  Akathisia:  No  Handed:  Right  AIMS (if indicated):     Assets:  Communication Skills Desire for Improvement Leisure Time Resilience Social Support Talents/Skills  ADL's:  Intact  Cognition:  WNL  Sleep:         Treatment Plan Summary: Daily contact with patient to assess and evaluate symptoms and progress in treatment and Medication management  Observation Level/Precautions:  Continuous Observation Laboratory:  CBC Chemistry Profile UDS ETOH level  Psychotherapy:  Individual for substance abuse Medications:  Start Seroquel 25 mg TID for anxiety/mood control, Neurontin 200 mg TID for agitation, Trazodone 100 mg hs for insomnia, and Ativan taper due to alcohol abuse Consultations:  None Discharge Concerns:  Continued substance abuse  Estimated LOS: 24-48 hours Other:      Elmarie Shiley, NP 7/28/20175:04 PM

## 2015-10-17 NOTE — Progress Notes (Signed)
T.C. To [facility] program.  Spoke with [facility] [employer], who states that the person we need to speak with is Craig [name].  She is not in at this time.  Inquired if there was someone else or a number that we can call to leave a message but he stated that the other person that has Mx Craig [name] number is not currently in either.  [facility] [employer] will leave a message with Ms. Craig [name] to call to [facility] OBS unit to inquire about the possibility of Craig [name] returning to his apartment at [facility].  We will await the call.

## 2015-10-17 NOTE — BH Assessment (Addendum)
Assessment Note  Craig [name] is an 55 y.o. male that presents this date under IVC initiated by Madilyn Hook MD EDP on admission after patient made threats to his therapist. Patient had H/I on admission threatening his therapist at "[facility]" an inpatient facility where patient has resided for the last month. Patient stated he had been maintaining his sobriety for the over a month but relapsed on 10/17/15 while being in treatment. Patient did admit to making threats to his counselor when impaired but denied any plan. Patient denies any H/I or S/I at the time of the assessment. Patient stated he has been "off and on his medications" for the last 5 months. Patient stated he has been receiving outpatient services from [facility] since January 2017. Patient states he has the desire to "get on the right medications" to assist with his depression which he feels, contributes to his ETOH use. Admission note stated: Patient presents to the Emergency Department complaining of HI. He presents from his therapist's office, "[facility]". There is a report that he was homicidal and was in an altercation at the office. Patient denies any HI and is not quite sure why he is here. He does endorse a vague desire to not live. He also endorses drinking as much alcohol as he can. Patient is requesting a voluntary admission (although is under an IVC) to assist with depression and ETOH issues. Patient requesting detox. Pt denies SI/HI/AH/VH. Patient reports history of depression with symptoms to include: feelings of hopelessness, lack of motivation and fatigue. Pt is interested in completing detox, then going back to rehabilitation (if accepted) on dischage and following up outpatient with psychiatry to manage depression. Case was staffed with Shaune Pollack DNP who recommended patient be admitted to observation unit.  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  Past  Medical History:  Past Medical History:  Diagnosis Date  . Back pain   . Bipolar 1 disorder (HCC)   . ETOH abuse   . Neck pain   . Pancreatitis   . Withdrawal seizures (HCC)     History reviewed. No pertinent surgical history.  Family History:  Family History  Problem Relation Age of Onset  . Hypertension Mother     Social History:  reports that he has never smoked. He has never used smokeless tobacco. He reports that he drinks alcohol. He reports that he does not use drugs.  Additional Social History:  Alcohol / Drug Use Pain Medications: See MAR Prescriptions: See MAR Over the Counter: See MAR History of alcohol / drug use?: Yes Longest period of sobriety (when/how [place]): 3 yrs Withdrawal Symptoms: Agitation, Weakness, Tremors Substance #1 Name of Substance 1: Alcohol 1 - Age of First Use: 29 1 - Amount (size/oz): 12 to 15 12oz beers 1 - Frequency: daily 1 - Duration: Last 20 years 1 - Last Use / Amount: 10/16/15 patient reported consuming 2 bottles of wine and "a few beers"  CIWA: CIWA-Ar BP: 131/67 Pulse Rate: 95 Nausea and Vomiting: mild nausea with no vomiting Tactile Disturbances: none Tremor: moderate, with patient's arms extended Auditory Disturbances: not present Paroxysmal Sweats: barely perceptible sweating, palms moist Visual Disturbances: not present Anxiety: no anxiety, at ease Headache, Fullness in Head: none present Agitation: somewhat more than normal activity Orientation and Clouding of Sensorium: oriented and can do serial additions CIWA-Ar Total: 7 COWS:    Allergies:  Allergies  Allergen Reactions  . Haldol [Haloperidol] Anaphylaxis  . Thorazine  [  Chlorpromazine] Anaphylaxis    Made tongueAnd unable to breathe  . Hydrocodone Itching  . Lithium Other (See Comments)    Unknown - "did something to my bloodwork"    Home Medications:  (Not in a hospital admission)  OB/GYN Status:  No LMP for male patient.  General Assessment  Data Location of Assessment: [facility] ED TTS Assessment: In system Is this a Tele or Face-to-Face Assessment?: Face-to-Face Is this an Initial Assessment or a Re-assessment for this encounter?: Initial Assessment Marital status: Single Maiden name: na Is patient pregnant?: No Pregnancy Status: No Living Arrangements: Alone Can pt return to current living arrangement?: Yes Admission Status: Involuntary Is patient capable of signing voluntary admission?: Yes Referral Source: Other (IVC written by EDP on admission)  Medical Screening Exam [facility] Walk-in ONLY) Medical Exam completed: Yes  Crisis Care Plan Living Arrangements: Alone Legal Guardian: Other: (none) Name of Psychiatrist: [facility] (pt cannot rememeber MD's name)  Education Status Is patient currently in school?: No Current Grade:  (na) Highest grade of school patient has completed: 12 Name of school: NA Contact person: NA  Risk to self with the past 6 months Suicidal Ideation: Yes-Currently Present (passive no plan) Has patient been a risk to self within the past 6 months prior to admission? : No Suicidal Intent: No Has patient had any suicidal intent within the past 6 months prior to admission? : No Is patient at risk for suicide?: No Suicidal Plan?: No Has patient had any suicidal plan within the past 6 months prior to admission? : No Access to Means: No What has been your use of drugs/alcohol within the last 12 months?: Current use Previous Attempts/Gestures: No How many times?: 1 Other Self Harm Risks: NA Triggers for Past Attempts: Unknown Family Suicide History: No Recent stressful life event(s): Other (Comment) (relationship issues) Persecutory voices/beliefs?: No Depression: Yes Depression Symptoms: Feeling worthless/self pity, Feeling angry/irritable Substance abuse history and/or treatment for substance abuse?: Yes Suicide prevention information given to non-admitted patients: Not applicable  Risk  to Others within the past 6 months Homicidal Ideation: No (was present on admission) Does patient have any lifetime risk of violence toward others beyond the six months prior to admission? : No Thoughts of Harm to Others: No (made threats at admission) Comment - Thoughts of Harm to Others: thoughts to harm counselor Current Homicidal Intent: No (pt denies current) Current Homicidal Plan: No Access to Homicidal Means: No Identified Victim: counselor at [facility] History of harm to others?: No Assessment of Violence: None Noted Violent Behavior Description:  (threats) Does patient have access to weapons?: No Criminal Charges Pending?: No Does patient have a court date: No Is patient on probation?: No  Psychosis Hallucinations: None noted Delusions: None noted  Mental Status Report Appearance/Hygiene: In scrubs Eye Contact: Fair Motor Activity: Freedom of movement Speech: Logical/coherent, Unremarkable Level of Consciousness: Drowsy Mood: Depressed Affect: Depressed Anxiety Level: Minimal Thought Processes: Coherent, Relevant Judgement: Unimpaired Orientation: Person, Place, Time Obsessive Compulsive Thoughts/Behaviors: None  Cognitive Functioning Concentration: Normal Memory: Recent Intact, Remote Intact  ADLScreening [facility] Assessment Services) Patient's cognitive ability adequate to safely complete daily activities?: Yes Patient able to express need for assistance with ADLs?: Yes Independently performs ADLs?: Yes (appropriate for developmental age)  Prior Inpatient Therapy Prior Inpatient Therapy: Yes (currently at [facility]) Prior Therapy Dates: 2017 Prior Therapy Facilty/Provider(s): [facility] Reason for Treatment: ETOH use  Prior Outpatient Therapy Prior Outpatient Therapy: Yes Prior Therapy Dates: 2017 Prior Therapy  Facilty/Provider(s): [facility] Reason for Treatment: SA use Does patient have an ACCT team?: No Does patient have  Intensive In-House Services?  : No Does patient have [facility] services? : No Does patient have P4CC services?: No  ADL Screening (condition at time of admission) Patient's cognitive ability adequate to safely complete daily activities?: Yes Is the patient deaf or have difficulty hearing?: No Does the patient have difficulty seeing, even when wearing glasses/contacts?: No Does the patient have difficulty concentrating, remembering, or making decisions?: No Patient able to express need for assistance with ADLs?: Yes Does the patient have difficulty dressing or bathing?: No Independently performs ADLs?: Yes (appropriate for developmental age) Does the patient have difficulty walking or climbing stairs?: No Weakness of Legs: None Weakness of Arms/Hands: None  Home Assistive Devices/Equipment Home Assistive Devices/Equipment: None  Therapy Consults (therapy consults require a physician order) PT Evaluation Needed: No OT Evalulation Needed: No SLP Evaluation Needed: No Abuse/Neglect Assessment (Assessment to be complete while patient is alone) Physical Abuse: Denies Verbal Abuse: Denies Sexual Abuse: Denies Exploitation of patient/patient's resources: Denies Self-Neglect: Denies Values / Beliefs Cultural Requests During Hospitalization: None Spiritual Requests During Hospitalization: None Consults Spiritual Care Consult Needed: No Social Work Consult Needed: No Merchant navy officer (For Healthcare) Does patient have an advance directive?: No Would patient like information on creating an advanced directive?: No - patient declined information (pt declined information)          Disposition:  Case was staffed with Shaune Pollack DNP who recommended patient be admitted to observation unit. Disposition Initial Assessment Completed for this Encounter: Yes Disposition of Patient: Other dispositions Other disposition(s): Other (Comment) (Observation Unit)  On Site Evaluation by:   Reviewed  with Physician:    Alfredia Ferguson 10/17/2015 11:25 AM

## 2015-10-17 NOTE — Progress Notes (Signed)
[facility] Observation Crisis Plan  Reason for Crisis Plan:  Crisis Stabilization and Substance Abuse   Plan of Care:  Referral for Telepsychiatry/Psychiatric Consult  Family Support:   Girlfriend and his Mother   Current Living Environment:  Living Arrangements: Alone  Insurance:   Hospital Account    Name Acct ID Class Status Primary Coverage   Craig, [name] [id number] BEHAVIORAL HEALTH OBSERVATION [place]        Guarantor Account (for Hospital Account [id number])    Name Relation to Pt Service Area Active? Acct Type   Craig [name] Self [facility] Yes [facility]   Address Phone       [address] [place], [place] [address] [phone])          Coverage Information (for Hospital Account [id number])    F/O Payor/Plan Precert #   [facility] [place]    Subscriber Subscriber #   Craig, [name] [id number]   Address Phone   [address] [place], [place] [address] [phone]      Legal Guardian:   No  Primary Care Provider:  [facility]  Current Outpatient Providers:  [facility]  Psychiatrist:   None  Counselor/Therapist:   [facility]  Compliant with Medications:  No  Additional Information: He is wanting to find out if he can go back to [facility] and wants to get back on mediations "so I can stay sober, I stay sober better when I am on the right medications."    Craig [name] 7/28/20174:00 PM

## 2015-10-17 NOTE — BH Assessment (Signed)
[facility] Assessment Progress Note  Per Thedore Mins, MD, this pt would benefit from admission to the [facility] Observation Unit at this time.  Pt presents under IVC, which Dr Jannifer Franklin has rescinded.  Berneice Heinrich, RN, [facility] has assigned pt to Obs 6.  Pt has signed Voluntary Admission and Consent for Treatment, as well as Consent to Release Information to [facility], his outpatient provider, as well as several family members, and signed forms have been faxed to [facility].  Pt's nurse, Marylu Lund, has been notified, and agrees to send original paperwork along with pt via Juel Burrow, and to call report to [phone] or [phone].  Doylene Canning, MA Triage Specialist [phone]

## 2015-10-17 NOTE — Progress Notes (Addendum)
Pt is asleep on his left side with regular respirations. He does awaken to his name called. Pt remains with a sitter and is safe. Pts mom called and pt gave permission for the writer to talk to her.(8:20am ) Pt stated he does not feel SI or HI and stated, "I never said that." Pts mom Rachael : [phone]. Pt refused to drink gatroade. Encouraged pt to shower and he stated , "not now." Pt expressed concerns about falling and instructed him the tech would assist him in the shower and we would have a chair in there for him,. Pt stated he did not want to wash up. Phoned observation to give report on pt going to observation bed no 6. Nurse unable to take report will phone back. (12:20pm)pt states he is now in a program called [facility],He states for 30 years he has been fine on the medicaitons and now he is not able to get these meds. The meds are: adderall, clonipine, trazadone and latuda. He stated, "I now feel anxious all the time." CIWA is a 5. (12:30pm)Pt stated it has been only one year I have been on these medications but 30 years they have been trying to find the right combination-Phoned observation to give report -Nurse unable to take report and stated she was discharging three people. Nurse , Herbert Seta , will phone back. (1:25pm)Phoned [facility] back per ED charge  to try to give report. No answer. Phoned [facility] AC, Tina. Per Inetta Fermo , "I will get her to call you back right now . I just saw her in the hall." (1:45pm)Phoned [facility] to pick the pt up at 2pm for transfer to [facility] observaiton bed # 6. 1:55pm-Report to [place] at [facility].

## 2015-10-18 DIAGNOSIS — F310 Bipolar disorder, current episode hypomanic: Secondary | ICD-10-CM | POA: Diagnosis not present

## 2015-10-18 MED ORDER — HYDROXYZINE HCL 25 MG PO TABS: 25.0000 mg | ORAL_TABLET | Freq: Four times a day (QID) | ORAL | 0 refills | Status: DC | PRN

## 2015-10-18 MED ORDER — QUETIAPINE FUMARATE 25 MG PO TABS: 25.0000 mg | ORAL_TABLET | Freq: Three times a day (TID) | ORAL | 0 refills | Status: AC

## 2015-10-18 MED ORDER — MULTI-VITAMINS PO TABS: 1.0000 | ORAL_TABLET | Freq: Every day | ORAL | Status: DC

## 2015-10-18 MED ORDER — FOLIC ACID 1 MG PO TABS: 1.0000 mg | ORAL_TABLET | Freq: Every day | ORAL | Status: DC

## 2015-10-18 MED ORDER — THIAMINE HCL 100 MG PO TABS: 100.0000 mg | ORAL_TABLET | Freq: Every day | ORAL | Status: DC

## 2015-10-18 MED ORDER — CLONIDINE HCL 0.2 MG PO TABS: 0.2000 mg | ORAL_TABLET | Freq: Two times a day (BID) | ORAL | Status: DC

## 2015-10-18 MED ORDER — TRAZODONE HCL 100 MG PO TABS: 100.0000 mg | ORAL_TABLET | Freq: Every day | ORAL | 0 refills | Status: AC

## 2015-10-18 MED ORDER — GABAPENTIN 100 MG PO CAPS: 200.0000 mg | ORAL_CAPSULE | Freq: Three times a day (TID) | ORAL | 0 refills | Status: AC

## 2015-10-18 NOTE — Progress Notes (Signed)
Patient sleeping at the time of shift change. Continues to sleep at this time. Staff woke up patient for bedtime meds and assessment. Patient accepted medications but did not comply with the assessment as he fell right back to sleep. Will continue to monitor patient.

## 2015-10-18 NOTE — Discharge Summary (Signed)
            [facility]-Observation Unit Discharge Summary  Admitted : 10/17/2015 Discharge date: 10/18/2015  Craig [name] an 55 y.o.malethat presented to Craig [name] under IVC initiated by Craig [name] EDP on admission after patient made threats to his therapist. Patient had homicidal ideation on admission threatening his therapist at "[name]" an inpatient facility where patient has resided for the last month. Patient stated he had been maintaining his sobriety for the over a month but relapsed on 10/17/15 while being in treatment due to severe anxiety. Patient did admit to making threats to his counselor when impaired but denied any plan. Patient denies any H/I or S/I at the time of the assessment. Patient stated he has been "off and on his medications" for the last 5 months. Patient stated he has been receiving outpatient services from Craig [name] since January 2017. Patient states he has the desire to "get back on the right medications." When asked what medications he is referring to patient stated "Klonopin, Adderall, and Latuda. I did really well on those. I have been Bipolar thirty years and have tried everything. Most of them gave me side effects. I am very frustrated that nobody will give me those medications. I don't think I would drink if I got them."  Initial notes report patient was seeking help with depression but patient reports Craig [name] history of Bipolar Disorder. He reports that he relapsed due to not being able to sleep at night, excessive worry, and racing thoughts. Craig [name] is very concerned about whether he will be able to go back to his treatment program. He remembers calling the counselor at the program but does not remember making threats "I had drank a bottle of wine." Per review of the chart the patient has multiple past visits for alcohol intoxication recently. Craig [name] was last inpatient at [facility] in 2014 on the 300 hall at which time he was stabilized on Neurontin, Vistaril, and  Seroquel. On admission his blood alcohol level was 374 and urine was positive for benzos. Patient informed that his symptoms would be managed with non-addictive medications due to his Hipp history of alcohol abuse.   Assessment per 10/18/2015:  Patient was noted to be visibly calmer after staying overnight in the Observation Unit. On admission the patient was started on Seroquel and Neurontin to help with mood instability and anxiety. Patient stated "I feel better. I still have a place in the program. I slept good on the Trazodone. My mind is not racing like it was. I feel grateful to have gotten some help. I am hoping the program with continue my follow up for the medications. I do not want to hurt myself. I am Craig to go back now. I just need prescriptions for my medications." Patient was found stable to discharge from the Observation Unit. There was no signs of withdrawal from alcohol and his vitals were documented to be stable. Patient left the [facility]-Observation Unit in no acute distress with all belongings returned to him.

## 2015-10-18 NOTE — Progress Notes (Signed)
D: Pt A & O X4. Denies SI, HI, AVH and pain at present. D/C back to "[facility]" drug and etoh program. Ambulatory with a steady gait. Denies concerns at this time.  A: Scheduled medications administered as prescribed. D/C instructions reviewed with pt including prescriptions and medication samples. All belongings in locker 31 returned to pt at time of departure. Support and availability provided to pt. Verbal encouragement offered towards treatment compliance post d/c. Safety maintained in observation unit without gestures or incident of self injurious behavior. R: Pt receptive to care. Compliant with medications, denies adverse drug reactions when assessed. Verbalized understanding related to d/c instructions. Signed belonging sheet in agreement with items received. No physical distress evident at time of departure.

## 2015-11-13 ENCOUNTER — Encounter (HOSPITAL_COMMUNITY): Payer: Self-pay | Admitting: Emergency Medicine

## 2015-11-13 ENCOUNTER — Emergency Department (HOSPITAL_COMMUNITY)
Admission: EM | Admit: 2015-11-13 | Discharge: 2015-11-14 | Disposition: A | Discharge status: Home / Self Care | Payer: Medicaid Other | Source: Home / Self Care | Attending: Emergency Medicine | Admitting: Emergency Medicine

## 2015-11-13 DIAGNOSIS — Z79899 Other long term (current) drug therapy: Secondary | ICD-10-CM

## 2015-11-13 DIAGNOSIS — F10129 Alcohol abuse with intoxication, unspecified: Secondary | ICD-10-CM | POA: Diagnosis present

## 2015-11-13 DIAGNOSIS — F10920 Alcohol use, unspecified with intoxication, uncomplicated: Secondary | ICD-10-CM

## 2015-11-13 DIAGNOSIS — F10120 Alcohol abuse with intoxication, uncomplicated: Secondary | ICD-10-CM | POA: Insufficient documentation

## 2015-11-13 MED ORDER — ONDANSETRON 8 MG PO TBDP
8.0000 mg | ORAL_TABLET | Freq: Once | ORAL | Status: AC
  Administered 2015-11-13: 8 mg via ORAL
  Filled 2015-11-13: qty 1

## 2015-11-13 MED ORDER — SODIUM CHLORIDE 0.9 % IV BOLUS (SEPSIS): 2000.0000 mL | Freq: Once | INTRAVENOUS | Status: DC

## 2015-11-13 MED ORDER — SODIUM CHLORIDE 0.9 % IV SOLN: INTRAVENOUS | Status: DC

## 2015-11-13 NOTE — ED Triage Notes (Addendum)
When RN walked into room for triage pt was lying in floor. And sts "I'm just sick my friend."  Pt was seen ambulating to bathroom without difficulty about 15 minutes before.   Pt tearful in triage and is a poor historian. Pt sts he wants detox from alcohol. His last drink was 1030ish this morning. Pt c/o N/V x 4 days. Pt denies abdominal pain. Pt appears to still be intoxicated.

## 2015-11-13 NOTE — ED Provider Notes (Signed)
[facility]-EMERGENCY DEPT Provider Note   CSN: [id number] Arrival date & time: 11/13/15  1936     History   Chief Complaint Chief Complaint  Patient presents with  . detox  . Emesis  . Alcohol Intoxication    HPI Craig [name] is a 55 y.o. male.  55 year old male presents after being here by [employer] due to public intoxication. He denies any suicidal or homicidal ideations. Has had emesis 3 which is been nonbilious or bloody. Denies any abdominal discomfort. Review the old records show that he was recently discharged from behavioral Hospital. Patient states he does not want any assistance with his alcohol disorder. States that he would like to has been for nausea and the discharge to home after a meal after he gets some sleep.      Past Medical History:  Diagnosis Date  . Back pain   . Bipolar 1 disorder (HCC)   . ETOH abuse   . Neck pain   . Pancreatitis   . Withdrawal seizures [facility](HCC)     Patient Active Problem List   Diagnosis Date Noted  . Bipolar affective disorder, current episode depressed (HCC) 10/17/2015  . Alcohol abuse with intoxication (HCC) 06/09/2012    Class: Acute  . Alcohol dependence (HCC) 06/09/2012    Class: Chronic  . Bipolar affective disorder, current episode hypomanic (HCC) 06/09/2012    Class: Chronic    History reviewed. No pertinent surgical history.     Home Medications    Prior to Admission medications   Medication Sig Start Date End Date Taking? Authorizing Provider  cloNIDine (CATAPRES) 0.2 MG tablet Take 1 tablet (0.2 mg total) by mouth 2 (two) times daily. Reported on 08/28/2015 10/18/15  Yes Thermon LeylandLaura A Davis, NP  folic acid (FOLVITE) 1 MG tablet Take 1 tablet (1 mg total) by mouth daily. Reported on 08/28/2015 10/18/15 10/17/16 Yes Thermon LeylandLaura A Davis, NP  gabapentin (NEURONTIN) 100 MG capsule Take 2 capsules (200 mg total) by mouth 3 (three) times daily. 10/18/15  Yes Thermon LeylandLaura A Davis, NP  hydrOXYzine (ATARAX/VISTARIL) 25 MG tablet Take 1 tablet (25 mg  total) by mouth every 6 (six) hours as needed (anxiety/agitation or CIWA < or = 10). 10/18/15  Yes Thermon LeylandLaura A Davis, NP  Multiple Vitamin (MULTI-VITAMINS) TABS Take 1 tablet by mouth daily. Reported on 08/28/2015 10/18/15 10/17/16 Yes Thermon LeylandLaura A Davis, NP  QUEtiapine (SEROQUEL) 25 MG tablet Take 1 tablet (25 mg total) by mouth 3 (three) times daily. 10/18/15  Yes Thermon LeylandLaura A Davis, NP  thiamine 100 MG tablet Take 1 tablet (100 mg total) by mouth daily. Reported on 08/28/2015 10/18/15 10/17/16 Yes Thermon LeylandLaura A Davis, NP  traZODone (DESYREL) 100 MG tablet Take 1 tablet (100 mg total) by mouth at bedtime. 10/18/15  Yes Thermon LeylandLaura A Davis, NP    Family History Family History  Problem Relation Age of Onset  . Hypertension Mother     Social History Social History  Substance Use Topics  . Smoking status: Never Smoker  . Smokeless tobacco: Never Used  . Alcohol use Yes     Comment: daily     Allergies   Haldol [haloperidol]; Thorazine  [chlorpromazine]; Hydrocodone; and Lithium   Review of Systems Review of Systems  All other systems reviewed and are negative.    Physical Exam Updated Vital Signs BP 120/89 (BP Location: Left Arm)   Pulse 96   Temp 98.3 F (36.8 C) (Oral)   Resp 18   SpO2 100%   Physical Exam  Constitutional: He is oriented to person, place, and time. He appears well-developed and well-nourished.  Non-toxic appearance. No distress.  HENT:  Head: Normocephalic and atraumatic.  Eyes: Conjunctivae, EOM and lids are normal. Pupils are equal, round, and reactive to light.  Neck: Normal range of motion. Neck supple. No tracheal deviation present. No thyroid mass present.  Cardiovascular: Normal rate, regular rhythm and normal heart sounds.  Exam reveals no gallop.   No murmur heard. Pulmonary/Chest: Effort normal and breath sounds normal. No stridor. No respiratory distress. He has no decreased breath sounds. He has no wheezes. He has no rhonchi. He has no rales.  Abdominal: Soft. Normal  appearance and bowel sounds are normal. He exhibits no distension. There is no tenderness. There is no rebound and no CVA tenderness.  Musculoskeletal: Normal range of motion. He exhibits no edema or tenderness.  Neurological: He is alert and oriented to person, place, and time. He has normal strength. No cranial nerve deficit or sensory deficit. GCS eye subscore is 4. GCS verbal subscore is 5. GCS motor subscore is 6.  Skin: Skin is warm and dry. No abrasion and no rash noted.  Psychiatric: He has a normal mood and affect. His speech is normal and behavior is normal.  Nursing note and vitals reviewed.    ED Treatments / Results  Labs (all labs ordered are listed, but only abnormal results are displayed) Labs Reviewed - No data to display  EKG  EKG Interpretation None       Radiology No results found.  Procedures Procedures (including critical care time)  Medications Ordered in ED Medications  ondansetron (ZOFRAN-ODT) disintegrating tablet 8 mg (not administered)   Patient given Zofran here and will be discharged home when clinically sober  Initial Impression / Assessment and Plan / ED Course  I have reviewed the triage vital signs and the nursing notes.  Pertinent labs & imaging results that were available during my care of the patient were reviewed by me and considered in my medical decision making (see chart for details).  Clinical Course      Final Clinical Impressions(s) / ED Diagnoses   Final diagnoses:  None    New Prescriptions New Prescriptions   No medications on file     Lorre Nick, MD 11/13/15 2243

## 2015-11-14 ENCOUNTER — Emergency Department (HOSPITAL_COMMUNITY)
Admission: EM | Admit: 2015-11-14 | Discharge: 2015-11-15 | Disposition: A | Discharge status: Home / Self Care | Payer: Medicaid Other | Attending: Emergency Medicine | Admitting: Emergency Medicine

## 2015-11-14 ENCOUNTER — Encounter (HOSPITAL_COMMUNITY): Payer: Self-pay | Admitting: Emergency Medicine

## 2015-11-14 DIAGNOSIS — F10129 Alcohol abuse with intoxication, unspecified: Secondary | ICD-10-CM | POA: Insufficient documentation

## 2015-11-14 DIAGNOSIS — F1010 Alcohol abuse, uncomplicated: Secondary | ICD-10-CM

## 2015-11-14 DIAGNOSIS — Z79899 Other long term (current) drug therapy: Secondary | ICD-10-CM | POA: Insufficient documentation

## 2015-11-14 NOTE — ED Notes (Signed)
Per Laymond PurserKatie,RN, pt did have blisters under L eye when he was in this facility this am.

## 2015-11-14 NOTE — ED Notes (Signed)
Patient ambulated to bathroom by self.

## 2015-11-14 NOTE — Progress Notes (Signed)
Patient noted to have been seen in the Ed 12 times within the last six months. Patient listed as having Medicaid insurance. [facility]. Because patient has Medicaid,  He is not eligible for medication assistance through [facility]. [facility] due to [facility]. [facility] to speak to patient and provide resources for substance abuse/outpatient rehab. No further EDCM needs at this time.

## 2015-11-14 NOTE — ED Provider Notes (Signed)
Patient has done well overnight. He has slept most of the night, now awake and alert, appropriate for discharge.  Vitals:   11/13/15 2106 11/14/15 0405  BP: 120/89 121/77  Pulse: 96 92  Resp: 18 17  Temp: 98.3 F (36.8 C)       Gilda Creasehristopher J Pollina, MD 11/14/15 [phone]

## 2015-11-14 NOTE — ED Triage Notes (Signed)
Pt d/c from this ED this am, per EMS [place] [employer] [place], consumed wine. Security called EMS for intoxicated individual.

## 2015-11-14 NOTE — ED Notes (Signed)
Patient ambulated to bathroom and given cup of water.

## 2015-11-14 NOTE — ED Notes (Signed)
Patient ambulated to restroom by self

## 2015-11-14 NOTE — ED Triage Notes (Signed)
Pt states he fell several times today, stating he drank "a lot" of wine today. Unable to find any injuries to head although pt does have what appears to be several blisters under L eye, pt states he did not have these blisters this am.

## 2015-11-14 NOTE — ED Provider Notes (Signed)
[facility]-EMERGENCY DEPT Provider Note   CSN: [id number] Arrival date & time: 11/14/15  1913  By signing my name below, I, [name], attest that this documentation has been prepared under the direction and in the presence of Audry Piliyler Yetzali Weld, PA-C.  Electronically Signed: [name], ED Scribe. 11/14/15. 8:25 PM.  History   Chief Complaint Chief Complaint  Patient presents with  . Alcohol Intoxication   The history is provided by the patient and medical records. No language interpreter was used.   HPI Comments: Craig [name] is a 55 y.o. male with a PMHx of Bipolar 1 disorder, EtOH abuse, pancreatitis, who presents to the Emergency Department w/ EtOH intoxication. Prior chart review shows that the pt has been seen for same in the ED multiple times over the past year. Pt states that he has drank "a lot" of wine today. Pt was d/c this AM for same, and upon leaving he went to a nearby store and bought and consumed cooking wine. Pt last drank ~1 hour ago. He notes that he drank approximately 10 bottles. Pt reports that he has had several episodes of nausea and vomiting while drinking today. When pressed about seeking recovery, he denies wanting to enter a detox program secondary many attempts in the past at detox with no success. Pt additionally notes that he has basal cell carcinoma above his left eye, dx'd by [facility]. He denies any CP, abdominal pain, pain otherwise, or any other associated symptoms.  Past Medical History:  Diagnosis Date  . Back pain   . Bipolar 1 disorder (HCC)   . ETOH abuse   . Neck pain   . Pancreatitis   . Withdrawal seizures [facility](HCC)    Patient Active Problem List   Diagnosis Date Noted  . Bipolar affective disorder, current episode depressed (HCC) 10/17/2015  . Alcohol abuse with intoxication (HCC) 06/09/2012    Class: Acute  . Alcohol dependence (HCC) 06/09/2012    Class: Chronic  . Bipolar affective disorder, current episode hypomanic (HCC) 06/09/2012    Class: Chronic   History reviewed. No pertinent surgical history.  Home Medications    Prior to Admission medications   Medication Sig Start Date End Date Taking? Authorizing Provider  cloNIDine (CATAPRES) 0.2 MG tablet Take 1 tablet (0.2 mg total) by mouth 2 (two) times daily. Reported on 08/28/2015 10/18/15   Thermon LeylandLaura A Davis, NP  folic acid (FOLVITE) 1 MG tablet Take 1 tablet (1 mg total) by mouth daily. Reported on 08/28/2015 10/18/15 10/17/16  Thermon LeylandLaura A Davis, NP  gabapentin (NEURONTIN) 100 MG capsule Take 2 capsules (200 mg total) by mouth 3 (three) times daily. 10/18/15   Thermon LeylandLaura A Davis, NP  hydrOXYzine (ATARAX/VISTARIL) 25 MG tablet Take 1 tablet (25 mg total) by mouth every 6 (six) hours as needed (anxiety/agitation or CIWA < or = 10). 10/18/15   Thermon LeylandLaura A Davis, NP  Multiple Vitamin (MULTI-VITAMINS) TABS Take 1 tablet by mouth daily. Reported on 08/28/2015 10/18/15 10/17/16  Thermon LeylandLaura A Davis, NP  QUEtiapine (SEROQUEL) 25 MG tablet Take 1 tablet (25 mg total) by mouth 3 (three) times daily. 10/18/15   Thermon LeylandLaura A Davis, NP  thiamine 100 MG tablet Take 1 tablet (100 mg total) by mouth daily. Reported on 08/28/2015 10/18/15 10/17/16  Thermon LeylandLaura A Davis, NP  traZODone (DESYREL) 100 MG tablet Take 1 tablet (100 mg total) by mouth at bedtime. 10/18/15   Thermon LeylandLaura A Davis, NP   Family History Family History  Problem Relation Age of Onset  .  Hypertension Mother    Social History Social History  Substance Use Topics  . Smoking status: Never Smoker  . Smokeless tobacco: Never Used  . Alcohol use Yes     Comment: daily   Allergies   Haldol [haloperidol]; Thorazine  [chlorpromazine]; Hydrocodone; and Lithium  Review of Systems Review of Systems A complete 10 system review of systems was obtained and all systems are negative except as noted in the HPI and PMH.   Physical Exam Updated Vital Signs There were no vitals taken for this visit.  Physical Exam  Constitutional: He appears well-developed and well-nourished.    EtOH smell on patients breath.   HENT:  Head: Normocephalic.  Eyes: Conjunctivae are normal.  Cardiovascular: Normal rate, regular rhythm and normal heart sounds.   No murmur heard. Pulmonary/Chest: Effort normal and breath sounds normal. No respiratory distress. He has no wheezes. He has no rales.  Abdominal: Soft. He exhibits no distension. There is no tenderness.  Musculoskeletal: Normal range of motion.  Neurological: He is alert.  Slurred speech on exam.   Skin: Skin is warm and dry.  Psychiatric: He has a normal mood and affect. His behavior is normal.  Nursing note and vitals reviewed.  ED Treatments / Results  DIAGNOSTIC STUDIES: Oxygen Saturation is 99% on RA, normal by my interpretation.   COORDINATION OF CARE: 8:24 PM-Discussed next steps with pt. Pt verbalized understanding and is agreeable with the plan.   Labs (all labs ordered are listed, but only abnormal results are displayed) Labs Reviewed - No data to display  EKG  EKG Interpretation None       Radiology No results found.  Procedures Procedures (including critical care time)  Medications Ordered in ED Medications - No data to display  Initial Impression / Assessment and Plan / ED Course  I have reviewed the triage vital signs and the nursing notes.  Pertinent labs & imaging results that were available during my care of the patient were reviewed by me and considered in my medical decision making (see chart for details).  Clinical Course    Final Clinical Impressions(s) / ED Diagnoses  I have reviewed the relevant previous healthcare records. I obtained HPI from historian.  ED Course:  Assessment:  Pt presents to the ED with EtOH intoxication. Noticeable slurred speech and EtOH smell on patient. Will allow to sleep/sober up for several hours. Pt denies any pain/falls/trauma at bedside. Does not want Detox assistance as he states it has failed in the past. No SI/HI. Will d/c upon sobering  up.  6:35 AM- Pt clinically sober. Able to ambulate without difficulty. Answers questions appropriately.    Disposition/Plan:  DC Home Additional Verbal discharge instructions given and discussed with patient.  Pt Instructed to f/u with PCP in the next week for evaluation and treatment of symptoms. Return precautions given Pt acknowledges and agrees with plan  Supervising Physician Lorre Nick, MD   Final diagnoses:  ETOH abuse    New Prescriptions New Prescriptions   No medications on file    I personally performed the services described in this documentation, which was scribed in my presence. The recorded information has been reviewed and is accurate.     Audry Pili, PA-C 11/15/15 0636    Lorre Nick, MD 11/16/15 [place] Manners

## 2015-11-15 ENCOUNTER — Emergency Department (HOSPITAL_COMMUNITY): Payer: Medicaid Other

## 2015-11-15 ENCOUNTER — Encounter (HOSPITAL_COMMUNITY): Payer: Self-pay | Admitting: *Deleted

## 2015-11-15 ENCOUNTER — Emergency Department (HOSPITAL_COMMUNITY)
Admission: EM | Admit: 2015-11-15 | Discharge: 2015-11-16 | Disposition: A | Discharge status: Home / Self Care | Payer: Medicaid Other | Source: Home / Self Care | Attending: Emergency Medicine | Admitting: Emergency Medicine

## 2015-11-15 DIAGNOSIS — R51 Headache: Secondary | ICD-10-CM | POA: Insufficient documentation

## 2015-11-15 DIAGNOSIS — Z79899 Other long term (current) drug therapy: Secondary | ICD-10-CM

## 2015-11-15 DIAGNOSIS — R1084 Generalized abdominal pain: Secondary | ICD-10-CM | POA: Insufficient documentation

## 2015-11-15 DIAGNOSIS — F10920 Alcohol use, unspecified with intoxication, uncomplicated: Secondary | ICD-10-CM

## 2015-11-15 DIAGNOSIS — F10120 Alcohol abuse with intoxication, uncomplicated: Secondary | ICD-10-CM | POA: Insufficient documentation

## 2015-11-15 DIAGNOSIS — F1010 Alcohol abuse, uncomplicated: Secondary | ICD-10-CM

## 2015-11-15 NOTE — ED Notes (Signed)
Pt ambulated to the restroom with assistance. Pt was very unsteady on his feet.

## 2015-11-15 NOTE — ED Provider Notes (Signed)
[facility]-EMERGENCY DEPT Provider Note   CSN: [id number] Arrival date & time: 11/15/15  1854     History   Chief Complaint Chief Complaint  Patient presents with  . Alcohol Intoxication    HPI Craig [name] is a 55 y.o. male with history of bipolar 1 disorder, EtOH abuse, pancreatitis who presents with EtOH intoxication. Patient was brought to emergency department by [employer] because he was acting out at [employer]. Patient reports he drank "a lot" of wine today. He states he has not drank as much in a [place] time. Patient states that he fell and hit his head when the police were here handling him. He is not sure if he lost consciousness. Patient states that he has abdominal pain and tenderness frequently when he drinks. This is not new. Patient is slurring speech and unable to answer some questions. Patient denies any chest pain and shortness of breath. Patient states he vomited prior to arrival and no longer feels nauseated.  HPI  Past Medical History:  Diagnosis Date  . Back pain   . Bipolar 1 disorder (HCC)   . ETOH abuse   . Neck pain   . Pancreatitis   . Withdrawal seizures [facility](HCC)     Patient Active Problem List   Diagnosis Date Noted  . Bipolar affective disorder, current episode depressed (HCC) 10/17/2015  . Alcohol abuse with intoxication (HCC) 06/09/2012    Class: Acute  . Alcohol dependence (HCC) 06/09/2012    Class: Chronic  . Bipolar affective disorder, current episode hypomanic (HCC) 06/09/2012    Class: Chronic    History reviewed. No pertinent surgical history.     Home Medications    Prior to Admission medications   Medication Sig Start Date End Date Taking? Authorizing Provider  cloNIDine (CATAPRES) 0.2 MG tablet Take 1 tablet (0.2 mg total) by mouth 2 (two) times daily. Reported on 08/28/2015 10/18/15   Thermon LeylandLaura A Davis, NP  folic acid (FOLVITE) 1 MG tablet Take 1 tablet (1 mg total) by mouth daily. Reported on 08/28/2015 10/18/15 10/17/16  Thermon LeylandLaura A Davis, NP  gabapentin  (NEURONTIN) 100 MG capsule Take 2 capsules (200 mg total) by mouth 3 (three) times daily. 10/18/15   Thermon LeylandLaura A Davis, NP  hydrOXYzine (ATARAX/VISTARIL) 25 MG tablet Take 1 tablet (25 mg total) by mouth every 6 (six) hours as needed (anxiety/agitation or CIWA < or = 10). 10/18/15   Thermon LeylandLaura A Davis, NP  Multiple Vitamin (MULTI-VITAMINS) TABS Take 1 tablet by mouth daily. Reported on 08/28/2015 10/18/15 10/17/16  Thermon LeylandLaura A Davis, NP  QUEtiapine (SEROQUEL) 25 MG tablet Take 1 tablet (25 mg total) by mouth 3 (three) times daily. 10/18/15   Thermon LeylandLaura A Davis, NP  thiamine 100 MG tablet Take 1 tablet (100 mg total) by mouth daily. Reported on 08/28/2015 10/18/15 10/17/16  Thermon LeylandLaura A Davis, NP  traZODone (DESYREL) 100 MG tablet Take 1 tablet (100 mg total) by mouth at bedtime. 10/18/15   Thermon LeylandLaura A Davis, NP    Family History Family History  Problem Relation Age of Onset  . Hypertension Mother     Social History Social History  Substance Use Topics  . Smoking status: Never Smoker  . Smokeless tobacco: Never Used  . Alcohol use Yes     Comment: daily     Allergies   Haldol [haloperidol]; Thorazine  [chlorpromazine]; Hydrocodone; and Lithium   Review of Systems Review of Systems  Reason unable to perform ROS: Limited by EtOH intoxication.  Constitutional: Negative for  chills and fever.  HENT: Negative for facial swelling and sore throat.   Respiratory: Negative for shortness of breath.   Cardiovascular: Negative for chest pain.  Gastrointestinal: Positive for abdominal pain ("a lot of times when I drink a lot"), nausea and vomiting.  Skin: Negative for rash and wound.  Neurological: Positive for headaches.  Psychiatric/Behavioral: The patient is not nervous/anxious.      Physical Exam Updated Vital Signs BP 126/86 (BP Location: Left Arm)   Pulse 90   Temp 97.9 F (36.6 C) (Oral)   Resp 18   SpO2 99%   Physical Exam  Constitutional: He appears well-developed and well-nourished. No distress.  Slurred  speech and stuttering  HENT:  Head: Normocephalic and atraumatic.    Eyes: Conjunctivae are normal. Pupils are equal, round, and reactive to light. Right eye exhibits no discharge. Left eye exhibits no discharge. No scleral icterus.  Neck: Normal range of motion. Neck supple. No thyromegaly present.  Cardiovascular: Normal rate, regular rhythm, normal heart sounds and intact distal pulses.  Exam reveals no gallop and no friction rub.   No murmur heard. Pulmonary/Chest: Effort normal and breath sounds normal. No stridor. No respiratory distress. He has no wheezes. He has no rales.  Abdominal: Soft. Bowel sounds are normal. He exhibits no distension. There is generalized tenderness. There is no rebound and no guarding.  Musculoskeletal: He exhibits no edema.  Lymphadenopathy:    He has no cervical adenopathy.  Neurological: He is alert. Coordination normal.  Patient freely moving all limbs; neuro exam limited by patient's intoxicated state  Skin: Skin is warm and dry. No rash noted. He is not diaphoretic. No pallor.  Psychiatric: He has a normal mood and affect.  Nursing note and vitals reviewed.    ED Treatments / Results  Labs (all labs ordered are listed, but only abnormal results are displayed) Labs Reviewed - No data to display  EKG  EKG Interpretation None       Radiology No results found.  Procedures Procedures (including critical care time)  Medications Ordered in ED Medications - No data to display   Initial Impression / Assessment and Plan / ED Course  I have reviewed the triage vital signs and the nursing notes.  Pertinent labs & imaging results that were available during my care of the patient were reviewed by me and considered in my medical decision making (see chart for details).  Clinical Course    Patient with alcohol intoxication. Patient is here very frequently with the same. Patient was here last evening for the same. CT Head pending. At shift  change, patient care transferred to [facility], PA-C for continued evaluation, follow up of head CT and sobering and determination of disposition. Anticipate discharge if negative head CT and patient sobering up.    Final Clinical Impressions(s) / ED Diagnoses   Final diagnoses:  None    New Prescriptions New Prescriptions   No medications on file     Verdis Prime 11/15/15 2143    Benjiman Core, MD 11/15/15 [phone]

## 2015-11-15 NOTE — Discharge Instructions (Signed)
Please read and follow all provided instructions.  Your diagnoses today include:  1. ETOH abuse     Tests performed today include: Vital signs. See below for your results today.   Medications prescribed:  Take as prescribed   Home care instructions:  Follow any educational materials contained in this packet.  Follow-up instructions: Please follow-up with your primary care provider for further evaluation of symptoms and treatment   Return instructions:  Please return to the Emergency Department if you do not get better, if you get worse, or new symptoms OR  - Fever (temperature greater than 101.68F)  - Bleeding that does not stop with holding pressure to the area    -Severe pain (please note that you may be more sore the day after your accident)  - Chest Pain  - Difficulty breathing  - Severe nausea or vomiting  - Inability to tolerate food and liquids  - Passing out  - Skin becoming red around your wounds  - Change in mental status (confusion or lethargy)  - New numbness or weakness    Please return if you have any other emergent concerns.  Additional Information:  Your vital signs today were: BP 137/82 (BP Location: Right Arm)    Pulse 95    Temp 98.7 F (37.1 C) (Oral)    Resp 16    SpO2 98%  If your blood pressure (BP) was elevated above 135/85 this visit, please have this repeated by your doctor within one month. ---------------

## 2015-11-15 NOTE — ED Notes (Signed)
Bed: WTR6 Expected date:  Expected time:  Means of arrival:  Comments: 

## 2015-11-15 NOTE — ED Notes (Signed)
Pt provided bus pass, pt offered breakfast tray before leaving, pt declined. Pt ambulatory from department with steady gait.

## 2015-11-15 NOTE — ED Triage Notes (Signed)
Per EMS, pt homeless, c/o alcohol intoxication, pt called by [employer] because he was being belligerent at [employer]. Pt has no complaints.

## 2015-11-16 ENCOUNTER — Encounter (HOSPITAL_COMMUNITY): Payer: Self-pay | Admitting: Nurse Practitioner

## 2015-11-16 ENCOUNTER — Emergency Department (HOSPITAL_COMMUNITY)
Admission: EM | Admit: 2015-11-16 | Discharge: 2015-11-17 | Disposition: A | Discharge status: Home / Self Care | Payer: Medicaid Other | Source: Home / Self Care | Attending: Emergency Medicine | Admitting: Emergency Medicine

## 2015-11-16 DIAGNOSIS — F10920 Alcohol use, unspecified with intoxication, uncomplicated: Secondary | ICD-10-CM

## 2015-11-16 DIAGNOSIS — F10120 Alcohol abuse with intoxication, uncomplicated: Secondary | ICD-10-CM

## 2015-11-16 DIAGNOSIS — Z79899 Other long term (current) drug therapy: Secondary | ICD-10-CM | POA: Insufficient documentation

## 2015-11-16 DIAGNOSIS — R1013 Epigastric pain: Secondary | ICD-10-CM

## 2015-11-16 LAB — CBC WITH DIFFERENTIAL/PLATELET
BASOS ABS: 0 10*3/uL (ref 0.0–0.1)
BASOS PCT: 0 %
EOS ABS: 0.1 10*3/uL (ref 0.0–0.7)
EOS PCT: 2 %
HCT: 35.1 % — ABNORMAL LOW (ref 39.0–52.0)
Hemoglobin: 12.2 g/dL — ABNORMAL LOW (ref 13.0–17.0)
Lymphocytes Relative: 43 %
Lymphs Abs: 2.2 10*3/uL (ref 0.7–4.0)
MCH: 32.4 pg (ref 26.0–34.0)
MCHC: 34.8 g/dL (ref 30.0–36.0)
MCV: 93.1 fL (ref 78.0–100.0)
MONO ABS: 0.3 10*3/uL (ref 0.1–1.0)
MONOS PCT: 6 %
NEUTROS ABS: 2.6 10*3/uL (ref 1.7–7.7)
Neutrophils Relative %: 49 %
Platelets: 267 10*3/uL (ref 150–400)
RBC: 3.77 MIL/uL — ABNORMAL LOW (ref 4.22–5.81)
RDW: 15.4 % (ref 11.5–15.5)
WBC: 5.1 10*3/uL (ref 4.0–10.5)

## 2015-11-16 LAB — RAPID URINE DRUG SCREEN, HOSP PERFORMED
AMPHETAMINES: NOT DETECTED
BARBITURATES: NOT DETECTED
Benzodiazepines: NOT DETECTED
COCAINE: NOT DETECTED
Opiates: NOT DETECTED
TETRAHYDROCANNABINOL: NOT DETECTED

## 2015-11-16 MED ORDER — SODIUM CHLORIDE 0.9 % IV SOLN
INTRAVENOUS | Status: DC
  Administered 2015-11-16: via INTRAVENOUS

## 2015-11-16 MED ORDER — ONDANSETRON HCL 4 MG/2ML IJ SOLN
4.0000 mg | Freq: Once | INTRAMUSCULAR | Status: AC
  Administered 2015-11-16: 4 mg via INTRAVENOUS
  Filled 2015-11-16: qty 2

## 2015-11-16 MED ORDER — SODIUM CHLORIDE 0.9 % IV BOLUS (SEPSIS)
2000.0000 mL | Freq: Once | INTRAVENOUS | Status: AC
  Administered 2015-11-16: 2000 mL via INTRAVENOUS

## 2015-11-16 NOTE — Discharge Instructions (Signed)
Stop drinking alcohol. Please follow up with your doctor.

## 2015-11-16 NOTE — ED Provider Notes (Signed)
[facility]-EMERGENCY DEPT Provider Note   CSN: [id number] Arrival date & time: 11/16/15  2213     History   Chief Complaint Chief Complaint  Patient presents with  . Alcohol Intoxication  . Emesis  . Fall    HPI Craig [name] is a 55 y.o. male.  55 year old male who [id number] this department due to his alcohol use disorder and this is his fourth visit in as many days presents with intoxication along with midepigastric abdominal pain nonbilious emesis. History is limited due to his intoxication. Denies any suicidal or homicidal ideations. States he is not depressed. Does not want help with alcohol use. Was found on the road by EMS and transported here      Past Medical History:  Diagnosis Date  . Back pain   . Bipolar 1 disorder (HCC)   . ETOH abuse   . Neck pain   . Pancreatitis   . Withdrawal seizures [facility](HCC)     Patient Active Problem List   Diagnosis Date Noted  . Bipolar affective disorder, current episode depressed (HCC) 10/17/2015  . Alcohol abuse with intoxication (HCC) 06/09/2012    Class: Acute  . Alcohol dependence (HCC) 06/09/2012    Class: Chronic  . Bipolar affective disorder, current episode hypomanic (HCC) 06/09/2012    Class: Chronic    History reviewed. No pertinent surgical history.     Home Medications    Prior to Admission medications   Medication Sig Start Date End Date Taking? Authorizing Provider  cloNIDine (CATAPRES) 0.2 MG tablet Take 1 tablet (0.2 mg total) by mouth 2 (two) times daily. Reported on 08/28/2015 10/18/15   Thermon LeylandLaura A Davis, NP  folic acid (FOLVITE) 1 MG tablet Take 1 tablet (1 mg total) by mouth daily. Reported on 08/28/2015 10/18/15 10/17/16  Thermon LeylandLaura A Davis, NP  gabapentin (NEURONTIN) 100 MG capsule Take 2 capsules (200 mg total) by mouth 3 (three) times daily. 10/18/15   Thermon LeylandLaura A Davis, NP  hydrOXYzine (ATARAX/VISTARIL) 25 MG tablet Take 1 tablet (25 mg total) by mouth every 6 (six) hours as needed (anxiety/agitation or CIWA < or = 10). 10/18/15    Thermon LeylandLaura A Davis, NP  Multiple Vitamin (MULTI-VITAMINS) TABS Take 1 tablet by mouth daily. Reported on 08/28/2015 10/18/15 10/17/16  Thermon LeylandLaura A Davis, NP  QUEtiapine (SEROQUEL) 25 MG tablet Take 1 tablet (25 mg total) by mouth 3 (three) times daily. 10/18/15   Thermon LeylandLaura A Davis, NP  thiamine 100 MG tablet Take 1 tablet (100 mg total) by mouth daily. Reported on 08/28/2015 10/18/15 10/17/16  Thermon LeylandLaura A Davis, NP  traZODone (DESYREL) 100 MG tablet Take 1 tablet (100 mg total) by mouth at bedtime. 10/18/15   Thermon LeylandLaura A Davis, NP    Family History Family History  Problem Relation Age of Onset  . Hypertension Mother     Social History Social History  Substance Use Topics  . Smoking status: Never Smoker  . Smokeless tobacco: Never Used  . Alcohol use Yes     Comment: daily     Allergies   Haldol [haloperidol]; Thorazine  [chlorpromazine]; Hydrocodone; and Lithium   Review of Systems Review of Systems  Unable to perform ROS: Acuity of condition     Physical Exam Updated Vital Signs BP 126/81   Pulse 86   Temp 98.6 F (37 C)   Resp 16   SpO2 93%   Physical Exam  Constitutional: He is oriented to person, place, and time. He appears well-developed and well-nourished. He appears lethargic.  Non-toxic appearance. No distress.  HENT:  Head: Normocephalic and atraumatic.  Eyes: Conjunctivae, EOM and lids are normal. Pupils are equal, round, and reactive to light.  Neck: Normal range of motion. Neck supple. No tracheal deviation present. No thyroid mass present.  Cardiovascular: Normal rate, regular rhythm and normal heart sounds.  Exam reveals no gallop.   No murmur heard. Pulmonary/Chest: Effort normal and breath sounds normal. No stridor. No respiratory distress. He has no decreased breath sounds. He has no wheezes. He has no rhonchi. He has no rales.  Abdominal: Soft. Normal appearance and bowel sounds are normal. He exhibits no distension. There is tenderness in the epigastric area. There is no  rebound and no CVA tenderness.    Musculoskeletal: Normal range of motion. He exhibits no edema or tenderness.  Neurological: He is oriented to person, place, and time. He appears lethargic. No cranial nerve deficit or sensory deficit. GCS eye subscore is 4. GCS verbal subscore is 5. GCS motor subscore is 6.  Skin: Skin is warm and dry. No abrasion and no rash noted.  Psychiatric: His affect is blunt. His speech is slurred. He is slowed.  Nursing note and vitals reviewed.    ED Treatments / Results  Labs (all labs ordered are listed, but only abnormal results are displayed) Labs Reviewed  ETHANOL  URINE RAPID DRUG SCREEN, HOSP PERFORMED  CBC WITH DIFFERENTIAL/PLATELET  COMPREHENSIVE METABOLIC PANEL  LIPASE, BLOOD    EKG  EKG Interpretation None       Radiology Ct Head Wo Contrast  Result Date: 11/15/2015 CLINICAL DATA:  Status post fall with head trauma. Ethanol intoxication. EXAM: CT HEAD WITHOUT CONTRAST TECHNIQUE: Contiguous axial images were obtained from the base of the skull through the vertex without intravenous contrast. COMPARISON:  10/28/2015 FINDINGS: Brain: No evidence of acute infarction, hemorrhage, hydrocephalus, extra-axial collection or mass lesion/mass effect. Vascular: No hyperdense vessel or unexpected calcification. Skull: No displaced fractures. Sinuses/Orbits: Partially visualized mucous retention cyst in the left maxillary sinus, otherwise no acute findings. Other: None. IMPRESSION: No acute intracranial abnormality. Partially visualized mucous retention cysts in the left maxillary sinus. Electronically Signed   By: Ted Mcalpine M.D.   On: 11/15/2015 21:59    Procedures Procedures (including critical care time)  Medications Ordered in ED Medications  sodium chloride 0.9 % bolus 2,000 mL (not administered)  0.9 %  sodium chloride infusion (not administered)  ondansetron (ZOFRAN) injection 4 mg (not administered)     Initial Impression /  Assessment and Plan / ED Course  I have reviewed the triage vital signs and the nursing notes.  Pertinent labs & imaging results that were available during my care of the patient were reviewed by me and considered in my medical decision making (see chart for details).  Clinical Course   Patient's alcohol level noted. Labs also reassuring. Treated with IV fluids and Zofran. No emesis here. He will be allowed to sober up  Final Clinical Impressions(s) / ED Diagnoses   Final diagnoses:  None    New Prescriptions New Prescriptions   No medications on file     Lorre Nick, MD 11/17/15 [phone]

## 2015-11-16 NOTE — ED Notes (Signed)
RN at bedside placing IV, will get blood draw as well.

## 2015-11-16 NOTE — ED Provider Notes (Signed)
patient signed out to me at shift change. Patient with alcohol intoxication, seen for the same yesterday. Patient apparently did report a fall and hitting his head. CT head pending.   CT head is negative. Patient is ambulatory around the emergency department, but appears to be intoxicated. We'll monitor.  5:54 AM Awake, alert, ambulatory. Moving all extremities. Stable for discharge home.  Vitals:   11/15/15 1902 11/15/15 2325 11/16/15 0516  BP: 126/86 136/77 156/93  Pulse: 90 88 91  Resp: 18 18 13   Temp: 97.9 F (36.6 C) 98 F (36.7 C) 98.6 F (37 C)  TempSrc: Oral Oral Oral  SpO2: 99% 93% 96%       Jaynie Crumbleatyana Brier Firebaugh, PA-C 11/16/15 0554    Tilden FossaElizabeth Rees, MD 11/17/15 [phone]

## 2015-11-16 NOTE — ED Notes (Signed)
Bed: WA04 Expected date:  Expected time:  Means of arrival:  Comments: EMS 55 yo male found lying in parking lot-intoxicated/vomiting

## 2015-11-16 NOTE — ED Notes (Signed)
Pt ambulated well to the bathroom--- pt's gait and balance steady.

## 2015-11-16 NOTE — ED Triage Notes (Signed)
Pt familarly presented to the department c/o vomiting secondary to etoh intoxication, was found by the road side by pedestrians who called EMS for the pt. Pt is arousable to voice but obviously obtunded.

## 2015-11-17 ENCOUNTER — Emergency Department (HOSPITAL_COMMUNITY)
Admission: EM | Admit: 2015-11-17 | Discharge: 2015-11-18 | Disposition: A | Discharge status: Home / Self Care | Payer: Medicaid Other | Source: Home / Self Care | Attending: Emergency Medicine | Admitting: Emergency Medicine

## 2015-11-17 DIAGNOSIS — F10120 Alcohol abuse with intoxication, uncomplicated: Secondary | ICD-10-CM | POA: Insufficient documentation

## 2015-11-17 DIAGNOSIS — F10920 Alcohol use, unspecified with intoxication, uncomplicated: Secondary | ICD-10-CM

## 2015-11-17 DIAGNOSIS — F1010 Alcohol abuse, uncomplicated: Secondary | ICD-10-CM

## 2015-11-17 LAB — COMPREHENSIVE METABOLIC PANEL
ALBUMIN: 4.5 g/dL (ref 3.5–5.0)
ALK PHOS: 167 U/L — AB (ref 38–126)
ALT: 59 U/L (ref 17–63)
ANION GAP: 11 (ref 5–15)
AST: 74 U/L — AB (ref 15–41)
BUN: 8 mg/dL (ref 6–20)
CALCIUM: 8.1 mg/dL — AB (ref 8.9–10.3)
CO2: 23 mmol/L (ref 22–32)
CREATININE: 0.57 mg/dL — AB (ref 0.61–1.24)
Chloride: 110 mmol/L (ref 101–111)
GFR calc Af Amer: >60 mL/min (ref >60)
GFR calc non Af Amer: >60 mL/min (ref >60)
GLUCOSE: 106 mg/dL — AB (ref 65–99)
Potassium: 3.4 mmol/L — ABNORMAL LOW (ref 3.5–5.1)
SODIUM: 144 mmol/L (ref 135–145)
TOTAL PROTEIN: 7.4 g/dL (ref 6.5–8.1)
Total Bilirubin: 0.5 mg/dL (ref 0.3–1.2)

## 2015-11-17 LAB — LIPASE, BLOOD: Lipase: 35 U/L (ref 11–51)

## 2015-11-17 LAB — ETHANOL: Alcohol, Ethyl (B): 342 mg/dL (ref <5)

## 2015-11-17 NOTE — ED Notes (Signed)
[facility] Expected date:  Expected time:  Means of arrival:  Comments: 23M/intoxicated/from [employer] parking lot

## 2015-11-17 NOTE — ED Provider Notes (Signed)
[facility] Provider Note   CSN: [id number] Arrival date & time: 11/17/15  1821     History   Chief Complaint Chief Complaint  Patient presents with  . Alcohol Intoxication    HPI Craig [name] is a 55 y.o. male.  HPI Patient presents to the emergency department intoxicated.  Patient was picked up by EMS in the [facility] parking [facility].  Patient had no complaints.  Patient will not talk to me or wake up to answer questions.  Patient has been seen here 4 straight days.  There is no signs of trauma Past Medical History:  Diagnosis Date  . Back pain   . Bipolar 1 disorder (HCC)   . ETOH abuse   . Neck pain   . Pancreatitis   . Withdrawal seizures [facility](HCC)     Patient Active Problem List   Diagnosis Date Noted  . Bipolar affective disorder, current episode depressed (HCC) 10/17/2015  . Alcohol abuse with intoxication (HCC) 06/09/2012    Class: Acute  . Alcohol dependence (HCC) 06/09/2012    Class: Chronic  . Bipolar affective disorder, current episode hypomanic (HCC) 06/09/2012    Class: Chronic    No past surgical history on file.     Home Medications    Prior to Admission medications   Medication Sig Start Date End Date Taking? Authorizing Provider  cloNIDine (CATAPRES) 0.2 MG tablet Take 1 tablet (0.2 mg total) by mouth 2 (two) times daily. Reported on 08/28/2015 10/18/15   Thermon LeylandLaura A Davis, NP  folic acid (FOLVITE) 1 MG tablet Take 1 tablet (1 mg total) by mouth daily. Reported on 08/28/2015 10/18/15 10/17/16  Thermon LeylandLaura A Davis, NP  gabapentin (NEURONTIN) 100 MG capsule Take 2 capsules (200 mg total) by mouth 3 (three) times daily. 10/18/15   Thermon LeylandLaura A Davis, NP  hydrOXYzine (ATARAX/VISTARIL) 25 MG tablet Take 1 tablet (25 mg total) by mouth every 6 (six) hours as needed (anxiety/agitation or CIWA < or = 10). 10/18/15   Thermon LeylandLaura A Davis, NP  Multiple Vitamin (MULTI-VITAMINS) TABS Take 1 tablet by mouth daily. Reported on 08/28/2015 10/18/15 10/17/16  Thermon LeylandLaura A Davis, NP  QUEtiapine  (SEROQUEL) 25 MG tablet Take 1 tablet (25 mg total) by mouth 3 (three) times daily. 10/18/15   Thermon LeylandLaura A Davis, NP  thiamine 100 MG tablet Take 1 tablet (100 mg total) by mouth daily. Reported on 08/28/2015 10/18/15 10/17/16  Thermon LeylandLaura A Davis, NP  traZODone (DESYREL) 100 MG tablet Take 1 tablet (100 mg total) by mouth at bedtime. 10/18/15   Thermon LeylandLaura A Davis, NP    Family History Family History  Problem Relation Age of Onset  . Hypertension Mother     Social History Social History  Substance Use Topics  . Smoking status: Never Smoker  . Smokeless tobacco: Never Used  . Alcohol use Yes     Comment: daily     Allergies   Haldol [haloperidol]; Thorazine  [chlorpromazine]; Hydrocodone; and Lithium   Review of Systems Review of Systems Level V caveat applies due to intoxication  Physical Exam Updated Vital Signs There were no vitals taken for this visit.  Physical Exam  Constitutional: He appears well-developed and well-nourished. No distress.  HENT:  Head: Normocephalic and atraumatic.  Mouth/Throat: Oropharynx is clear and moist.  Eyes: Pupils are equal, round, and reactive to light.  Neck: Normal range of motion. Neck supple.  Cardiovascular: Normal rate, regular rhythm and normal heart sounds.  Exam reveals no gallop and no friction rub.  No murmur heard. Pulmonary/Chest: Effort normal and breath sounds normal. No respiratory distress. He has no wheezes.  Abdominal: Soft. He exhibits no distension. There is no tenderness.  Neurological: He is alert. He exhibits normal muscle tone. Coordination normal.  Skin: Skin is warm and dry. No rash noted. No erythema.  Psychiatric: He has a normal mood and affect. His behavior is normal.  Nursing note and vitals reviewed.    ED Treatments / Results  Labs (all labs ordered are listed, but only abnormal results are displayed) Labs Reviewed - No data to display  EKG  EKG Interpretation None       Radiology No results  found.  Procedures Procedures (including critical care time)  Medications Ordered in ED Medications - No data to display   Initial Impression / Assessment and Plan / ED Course  I have reviewed the triage vital signs and the nursing notes.  Pertinent labs & imaging results that were available during my care of the patient were reviewed by me and considered in my medical decision making (see chart for details).  Clinical Course      Final Clinical Impressions(s) / ED Diagnoses   Final diagnoses:  None    New Prescriptions New Prescriptions   No medications on file     Charlestine Night, PA-C 11/17/15 2158    Lavera Guise, MD 11/18/15 [phone]

## 2015-11-17 NOTE — ED Notes (Signed)
Pt able to ambulate independently w/ a steady gait.  D/c instructions reviewed.  Pt refused D/c VS.

## 2015-11-17 NOTE — ED Triage Notes (Signed)
Pt having been discharged earlier this a.m is presented back for etoh intoxication. Has no complaints, heavily obtunded.

## 2015-11-18 ENCOUNTER — Encounter (HOSPITAL_COMMUNITY): Payer: Self-pay | Admitting: Emergency Medicine

## 2015-11-18 ENCOUNTER — Emergency Department (HOSPITAL_COMMUNITY)
Admission: EM | Admit: 2015-11-18 | Discharge: 2015-11-18 | Disposition: A | Discharge status: Home / Self Care | Payer: Medicaid Other | Source: Home / Self Care | Attending: Emergency Medicine | Admitting: Emergency Medicine

## 2015-11-18 DIAGNOSIS — F10120 Alcohol abuse with intoxication, uncomplicated: Secondary | ICD-10-CM | POA: Insufficient documentation

## 2015-11-18 DIAGNOSIS — F10920 Alcohol use, unspecified with intoxication, uncomplicated: Secondary | ICD-10-CM

## 2015-11-18 NOTE — ED Triage Notes (Signed)
Patient was found behind store sleeping.  EMS informed him he could not sleep behind store and patient requested to come to hospital.  BP: 138/84 P:100 R:21 CBG:174

## 2015-11-18 NOTE — ED Notes (Signed)
Attempted to walk pt.; pt could not stand without assistance and could not tie shoelaces without nearly falling over.  Gross and fine motor coordination appears greatly impaired. Speech slurred.  [facility]

## 2015-11-18 NOTE — ED Notes (Signed)
Patient is able to tolerate fluids without any complications

## 2015-11-18 NOTE — ED Notes (Signed)
Bed: [facility] Expected date:  Expected time:  Means of arrival:  Comments: EMS ETOH 

## 2015-11-18 NOTE — ED Notes (Signed)
Patient ambulated to bathroom by self.

## 2015-11-18 NOTE — ED Notes (Signed)
Patient is A & O x4.  He understood discharge instructions 

## 2015-11-18 NOTE — ED Notes (Signed)
Patient decided to leave without waiting for vital signs or seeing the MD.

## 2015-11-18 NOTE — ED Notes (Signed)
Unable to offer fluids due to patient was asleep

## 2015-11-18 NOTE — ED Provider Notes (Signed)
[facility] Provider Note   CSN: [id number] Arrival date & time: 11/18/15  1210  By signing my name below, I, Craig [name], attest that this documentation has been prepared under the direction and in the presence of Audry Piliyler Devri Kreher, PA-C. Electronically Signed: Javier Dockerobert Ryan [name], ER Scribe. 11/01/2015. 12:35 PM.   History   Chief Complaint Chief Complaint  Patient presents with  . Alcohol Intoxication    The history is provided by the patient. No language interpreter was used.    HPI Comments: Craig [name] is a 55 y.o. male who presents to the Emergency Department complaining of alcohol intoxication. Patient was found behind store sleeping. EMS informed him he could not sleep behind store and patient requested to come to hospital. Has been seen several trime in ED for alcohol intox. Endorses ETOH intake this morning, unsure of amount. Denies SI, HI. Does not want detox information.   Past Medical History:  Diagnosis Date  . Back pain   . Bipolar 1 disorder (HCC)   . ETOH abuse   . Neck pain   . Pancreatitis   . Withdrawal seizures [facility](HCC)     Patient Active Problem List   Diagnosis Date Noted  . Bipolar affective disorder, current episode depressed (HCC) 10/17/2015  . Alcohol abuse with intoxication (HCC) 06/09/2012    Class: Acute  . Alcohol dependence (HCC) 06/09/2012    Class: Chronic  . Bipolar affective disorder, current episode hypomanic (HCC) 06/09/2012    Class: Chronic    History reviewed. No pertinent surgical history.   Home Medications    Prior to Admission medications   Medication Sig Start Date End Date Taking? Authorizing Provider  cloNIDine (CATAPRES) 0.2 MG tablet Take 0.2 mg by mouth 2 (two) times daily.    Historical Provider, MD  folic acid (FOLVITE) 1 MG tablet Take 1 mg by mouth daily.    Historical Provider, MD  gabapentin (NEURONTIN) 100 MG capsule Take 2 capsules (200 mg total) by mouth 3 (three) times daily. 10/18/15   Thermon LeylandLaura A Davis, NP    hydrOXYzine (ATARAX/VISTARIL) 25 MG tablet Take 25 mg by mouth every 6 (six) hours as needed for anxiety (and/or agitation or CIWA < or = 10).    Historical Provider, MD  Multiple Vitamin (MULTIVITAMIN WITH MINERALS) TABS tablet Take 1 tablet by mouth daily.    Historical Provider, MD  QUEtiapine (SEROQUEL) 25 MG tablet Take 1 tablet (25 mg total) by mouth 3 (three) times daily. 10/18/15   Thermon LeylandLaura A Davis, NP  thiamine (VITAMIN B-1) 100 MG tablet Take 100 mg by mouth daily.    Historical Provider, MD  traZODone (DESYREL) 100 MG tablet Take 1 tablet (100 mg total) by mouth at bedtime. 10/18/15   Thermon LeylandLaura A Davis, NP    Family History Family History  Problem Relation Age of Onset  . Hypertension Mother     Social History Social History  Substance Use Topics  . Smoking status: Never Smoker  . Smokeless tobacco: Never Used  . Alcohol use Yes     Comment: daily     Allergies   Haldol [haloperidol]; Thorazine [chlorpromazine]; Hydrocodone; and Lithium   Review of Systems Review of Systems  Constitutional: Negative for chills and fever.  Gastrointestinal: Negative for nausea and vomiting.  Neurological: Negative for facial asymmetry and headaches.    Physical Exam Updated Vital Signs There were no vitals taken for this visit.  Physical Exam  Constitutional: He is oriented to person, place, and time.  Vital signs are normal. He appears well-developed and well-nourished. No distress.  Pt intoxicated. Phonating well. Answering questions.  HENT:  Head: Normocephalic and atraumatic.  Eyes: EOM are normal. Pupils are equal, round, and reactive to light.  Neck: Normal range of motion. Neck supple.  Cardiovascular: Normal rate, regular rhythm and normal heart sounds.   Pulmonary/Chest: Effort normal and breath sounds normal. No respiratory distress.  Abdominal: Soft.  Musculoskeletal: Normal range of motion.  Neurological: He is alert and oriented to person, place, and time. He has normal  strength. No cranial nerve deficit or sensory deficit. Coordination normal.  Skin: Skin is warm and dry. He is not diaphoretic.  Psychiatric: He has a normal mood and affect. His behavior is normal.  Nursing note and vitals reviewed.  ED Treatments / Results  Labs (all labs ordered are listed, but only abnormal results are displayed) Labs Reviewed - No data to display  EKG  EKG Interpretation None      Radiology No results found.  Procedures Procedures (including critical care time)  Medications Ordered in ED Medications - No data to display   Initial Impression / Assessment and Plan / ED Course  I have reviewed the triage vital signs and the nursing notes.  Pertinent labs & imaging results that were available during my care of the patient were reviewed by me and considered in my medical decision making (see chart for details).  Clinical Course   Final Clinical Impressions(s) / ED Diagnoses  I have reviewed the relevant previous healthcare records. I obtained HPI from historian.  ED Course:  Assessment: Pt is a 55yM who presents with ETOH intoxication. Found sleeping [facility] [employer] and told EMS he wanted to come to hospital to sleep. On exam, pt in NAD. Nontoxic/nonseptic appearing. VSS. Afebrile. Lungs CTA. Heart RRR. Abdomen nontender soft. Pt clinically intoxicated, but answers questions appropriately. Seen for the past 5 days for same. Denies SI/HI. Does not want ETOH detox information. Unremarkable work ups. Given PO fluids in ED. Plan is to DC home once clinically sober. At time of discharge, Patient is in no acute distress. Vital Signs are stable. Patient is able to ambulate. Patient able to tolerate PO.   4:40 PM- Pt clinically sober. Able to ambulate well. Tolerates PO. Pt articulates well and answers questions appropriately.     Disposition/Plan:  DC Home Additional Verbal discharge instructions given and discussed with patient.  Pt Instructed to f/u with PCP  in the next week for evaluation and treatment of symptoms. Return precautions given Pt acknowledges and agrees with plan  Supervising Physician Geoffery Lyons, MD   Final diagnoses:  Alcohol intoxication, uncomplicated (HCC)    New Prescriptions New Prescriptions   No medications on file      I personally performed the services described in this documentation, which was scribed in my presence. The recorded information has been reviewed and is accurate.       Audry Pili, PA-C 11/18/15 1641    Geoffery Lyons, MD 11/18/15 [phone]

## 2015-11-18 NOTE — Discharge Instructions (Signed)
Substance Abuse Treatment Programs ° °Intensive Outpatient Programs °[facility]     °[address]      °[place], [place]                   °[phone]      ° °[facility] °[address] °[place], [place] °[phone] ° °[facility]     °(Inpatient and outpatient)     °[address]           °[phone]   ° °[facility] °[phone] (Suboxone and Methadone) ° °[address]      °[place], [place] [address]      °[phone]      ° °[address] °[place], [place] °852-3033 ° °[facility] (Outpatient/Inpatient, Chemical)    °(insurance only) [phone]      °       °[facility] (Groups & Residential) °[place], [place] °[phone] ° °   °[facility]     °[address]     °[place], [place]      °[phone]      ° °[facility] (for caregivers and family) °[address] °[place], [place] °336-299-4655 ° ° ° ° ° °Residential Treatment Programs °[facility]      °[address], [place], [place] [address]  °[phone]      ° °[facility] °[address] [place], [place] [address] °[phone] ° °[facility]        °336-248-8914      ° °[facility] °1-800-659-3381 ° °[facility]             °[address]                                         °[place], [place]                                                °[phone] or [phone]                              ° °[facility] [place] °[address] °[place], [address] °[phone] ° °[facility]    °[address]      °[place], [place]     °[phone]      ° °[facility] °[address] °[place], [place] °336-285-9073 ° °[facility]   °[address]     °[place], [place] [address]     °[phone]      °Admissions: 8am-3pm M-F ° °[facility] °[address] °[place],  [place] °336-227-7417 ° °[facility]: Residential Program (90 Days)   °[place], [place]      °336-725-8389 or 800-758-6077    ° °[facility] °[place], [place] °(Walk in Hours over the weekend or by referral) ° °[facility] °[address], [place],  [address] °([phone] ° °Crisis Mobile: [facility]:  1-877-626-1772 (for crisis response 24 hours a day) °[facility] Hotline:      1-800-256-2452 °Outpatient Psychiatry and Counseling ° °[facility]: Mobile Crisis   Management 24 hours:  1-877-626-1772 ° °[facility] sliding scale fee and walk in schedule: M-F 8am-12pm/1pm-3pm °[address]  °[place], [place] [address] °[phone] ° °[facility] °[address] °[place], [place] [address] °336-703-9650 ° °[facility] ([facility])- new patient walk-in appointments available Monday - Friday 8am -3pm.          °[address] °[place], [place] [address] °336-676-6840 or crisis line- 336-676-6905 ° °[facility] Intensive Outpatient Therapy Program °[address] °[place], [place] [address] °336-832-9804 ° °[facility]                  °Crisis Services      °[phone]      °[address]     °[place], [place] [address]                ° °[facility]   °[facility] °[phone] °[address] °[place], [place] [address] ° ° °[facility]          °[address]  °[place], [place] [address]       °([phone] ° °[facility] °[address] °[place], [place] [address] °336-292-1510 ° °[facility]    °[address]    °[place], [place] [address]     °[phone]      ° °Parish McKinney, MD     °[address]     °[place] [address]     °336-282-1251     °  °[facility] °[address] °[place] [address] ° °[facility]     °[address]     °[place], [place]      °336-542-2076      ° °[facility] °Shamsher Ahluwalia, MD °[address] °[place], [place] [address] °[phone] ° °[facility]     °[address]     °[place], [place] [address]     °336-274-1237      ° °[facility] °[address] °[place], [place] [address] °336-854-4450 °Resources for Temporary Residential Assistance/Crisis Centers ° °DAY CENTERS °[facility] °M-F 8am-3pm   °[address] [place], [place] [address]   336-332-0824 °Services include: laundry, barbering, support groups, case management, phone  & computer access, showers, AA/NA mtgs, mental health/substance abuse nurse, job skills class, disability information, VA assistance, spiritual classes, etc.  ° °HOMELESS SHELTERS ° °[facility]     °[facility]   °[address], [place]     °336.271.5959       °       °[facility] (women and children)       °[address] °[place], [place] [address] °336-275-0820 °[email or web address] for application and process °Application Required ° °[facility]   °[address]    °[place] [address]     °[phone]       °             °[facility] °[address] °[place], [place] [address] °336.273.5572 °336-235-0363(schedule application appt.) °Application Required ° °[facility] (women only)    °[address]     °[place], [place] [address]     °336-884-1039      °  Intake starts 6pm daily °Need valid ID, SSC, & Police report °[facility] [place] °[address] °[place], [place] °336-881-5420 °Application Required ° °[facility] (men only)     °[address]      °[place], [place]     °[phone]      ° °[facility] °(Pregnant women only) °[address] °[place], [place] °336-275-0206 ° °[facility]      °[address]      °[place], [place] [address]     °336-722-9951      °       °[facility] °[address] °[place], [place] °336-723-1848 °90 day commitment/SA/Application process ° °[facility](men only)     °[address]     °[place], [place]     °[phone]       °Check-in at 7pm     °       °[facility] °[address] °[place], [place] [address] °336-248-6684 °Men/Women/Women and Children must be there by 7 pm ° °[facility] °[place], [place] °336-722-8721                ° °

## 2016-07-21 ENCOUNTER — Emergency Department (HOSPITAL_BASED_OUTPATIENT_CLINIC_OR_DEPARTMENT_OTHER): Payer: Medicaid Other

## 2016-07-21 ENCOUNTER — Emergency Department (HOSPITAL_BASED_OUTPATIENT_CLINIC_OR_DEPARTMENT_OTHER)
Admission: EM | Admit: 2016-07-21 | Discharge: 2016-07-22 | Disposition: A | Discharge status: Home / Self Care | Payer: Medicaid Other | Attending: Emergency Medicine | Admitting: Emergency Medicine

## 2016-07-21 ENCOUNTER — Encounter (HOSPITAL_BASED_OUTPATIENT_CLINIC_OR_DEPARTMENT_OTHER): Payer: Self-pay | Admitting: Emergency Medicine

## 2016-07-21 DIAGNOSIS — M549 Dorsalgia, unspecified: Secondary | ICD-10-CM

## 2016-07-21 DIAGNOSIS — M546 Pain in thoracic spine: Secondary | ICD-10-CM | POA: Insufficient documentation

## 2016-07-21 DIAGNOSIS — F10220 Alcohol dependence with intoxication, uncomplicated: Secondary | ICD-10-CM

## 2016-07-21 DIAGNOSIS — F10120 Alcohol abuse with intoxication, uncomplicated: Secondary | ICD-10-CM | POA: Insufficient documentation

## 2016-07-21 MED ORDER — ACETAMINOPHEN 500 MG PO TABS
1000.0000 mg | ORAL_TABLET | Freq: Once | ORAL | Status: AC
Start: 2016-07-21 — End: 2016-07-21
  Administered 2016-07-21: 1000 mg via ORAL
  Filled 2016-07-21: qty 2

## 2016-07-21 NOTE — ED Triage Notes (Signed)
Pt is homeless.  Called EMS from [facility] parking lot for back pain.  Intoxicated.  Pt refused to go to [facility].  Was seen today at [facility] and walked out, angry about how he was treated.  Pt requested to go to [facility] per EMS but they are not able to take him there.  Next choice was here.  Back pain is chronic.

## 2016-07-21 NOTE — ED Provider Notes (Signed)
[facility]Y [facility] Provider Note   CSN: [id number] Arrival date & time: 07/21/16  2142  By signing my name below, I, Craig [name], attest that this documentation has been prepared under the direction and in the presence of physician practitioner, Melene Plan, DO. Electronically Signed: Linna [name], Scribe. 07/21/2016. 9:54 PM.  History   Chief Complaint Chief Complaint  Patient presents with  . Back pain, Acute intoxication   The history is provided by the patient. No language interpreter was used.    HPI Comments: LEVEL 5 CAVEAT FOR ALCOHOL INTOXICATION Craig [name] is a homeless 56 y.o. male with PMHx including EtOH abuse and chronic back pain who presents to the Emergency Department via EMS for evaluation of alcohol intoxication. Per the triage note, he was found in a [facility] parking lot shortly PTA and appeared to be intoxicated. He is currently complaining of acute generalized pain including an exacerbation of his chronic back pain as the result of a recent fall. Patient is also complaining of left periorbital pain as a result of the fall. No LOC. He was evaluated in the [facility] ED earlier today and had CT scans of his head and abdomen/pelvis performed; he eventually left AMA because he did not like the way he was being treated. He has no other complaints at this time.    Past Medical History:  Diagnosis Date  . Back pain   . Bipolar 1 disorder (HCC)   . ETOH abuse   . Neck pain   . Pancreatitis   . Withdrawal seizures [facility])     Patient Active Problem List   Diagnosis Date Noted  . Bipolar affective disorder, current episode depressed (HCC) 10/17/2015  . Alcohol abuse with intoxication (HCC) 06/09/2012    Class: Acute  . Alcohol dependence (HCC) 06/09/2012    Class: Chronic  . Bipolar affective disorder, current episode hypomanic (HCC) 06/09/2012    Class: Chronic    History reviewed. No pertinent surgical history.     Home Medications    Prior  to Admission medications   Medication Sig Start Date End Date Taking? Authorizing Provider  cloNIDine (CATAPRES) 0.2 MG tablet Take 0.2 mg by mouth 2 (two) times daily.    Historical Provider, MD  folic acid (FOLVITE) 1 MG tablet Take 1 mg by mouth daily.    Historical Provider, MD  gabapentin (NEURONTIN) 100 MG capsule Take 2 capsules (200 mg total) by mouth 3 (three) times daily. 10/18/15   Thermon Leyland, NP  hydrOXYzine (ATARAX/VISTARIL) 25 MG tablet Take 25 mg by mouth every 6 (six) hours as needed for anxiety (and/or agitation or CIWA < or = 10).    Historical Provider, MD  Multiple Vitamin (MULTIVITAMIN WITH MINERALS) TABS tablet Take 1 tablet by mouth daily.    Historical Provider, MD  QUEtiapine (SEROQUEL) 25 MG tablet Take 1 tablet (25 mg total) by mouth 3 (three) times daily. 10/18/15   Thermon Leyland, NP  thiamine (VITAMIN B-1) 100 MG tablet Take 100 mg by mouth daily.    Historical Provider, MD  traZODone (DESYREL) 100 MG tablet Take 1 tablet (100 mg total) by mouth at bedtime. 10/18/15   Thermon Leyland, NP    Family History Family History  Problem Relation Age of Onset  . Hypertension Mother     Social History Social History  Substance Use Topics  . Smoking status: Never Smoker  . Smokeless tobacco: Never Used  . Alcohol use Yes  Comment: daily     Allergies   Haldol [haloperidol]; Thorazine [chlorpromazine]; Hydrocodone; and Lithium   Review of Systems Review of Systems  Unable to perform ROS: Other (alcohol intoxication)   Physical Exam Updated Vital Signs BP 113/72 (BP Location: Left Arm)   Pulse (!) 109   Temp 98.4 F (36.9 C) (Oral)   Resp 16   Ht 6' (1.829 m)   Wt 215 lb (97.5 kg)   SpO2 96%   BMI 29.16 kg/m   Physical Exam  Constitutional: He appears well-developed and well-nourished.  Smells of alcohol on breath. Slurring speech.  HENT:  Head: Normocephalic.  Has dried blood underneath his left eye.  Eyes: EOM are normal. Pupils are equal,  round, and reactive to light.  Neck: Normal range of motion. Neck supple.  Cardiovascular: Normal rate and regular rhythm.  Exam reveals no gallop and no friction rub.   No murmur heard. Pulmonary/Chest: Effort normal. No respiratory distress. He has no wheezes.  Musculoskeletal: Normal range of motion.  Pain to his back before I touch his back and then complains of severe pain throughout his spine. Moving BLE's without difficulty.   Neurological: He is alert.  Skin: No rash noted. No pallor.  Psychiatric: He has a normal mood and affect. His behavior is normal.  Nursing note and vitals reviewed.  ED Treatments / Results  Labs (all labs ordered are listed, but only abnormal results are displayed) Labs Reviewed - No data to display  EKG  EKG Interpretation None       Radiology Dg Thoracic Spine W/swimmers  Result Date: 07/21/2016 CLINICAL DATA:  Larey Seat today, acute on chronic back pain. History of bipolar and alcohol abuse. EXAM: THORACIC SPINE - 3 VIEWS COMPARISON:  CT chest, abdomen and pelvis July 10, 2016 FINDINGS: Thoracic vertebral bodies intact and aligned with maintenance of thoracic kyphosis. Intervertebral disc heights preserved, multilevel stable mild ventral endplate spurring. No destructive bony lesions. Prevertebral and paraspinal soft tissue planes are non-suspicious. IMPRESSION: No acute fracture deformity or malalignment. Electronically Signed   By: Awilda Metro M.D.   On: 07/21/2016 22:39    Procedures Procedures (including critical care time)  DIAGNOSTIC STUDIES: Oxygen Saturation is 96% on RA, adequate by my interpretation.    Medications Ordered in ED Medications  acetaminophen (TYLENOL) tablet 1,000 mg (1,000 mg Oral Given 07/21/16 2209)     Initial Impression / Assessment and Plan / ED Course  I have reviewed the triage vital signs and the nursing notes.  Pertinent labs & imaging results that were available during my care of the patient were  reviewed by me and considered in my medical decision making (see chart for details).  Clinical Course as of Jul 21 2256  Wed Jul 21, 2016  2229 [facility] Thoracic Spine W/Swimmers [RT]    Clinical Course User Index [RT] Craig [name]    56 yo M with etoh intoxication.  Seen earlier today at [facility].  Limited hx due to intoxication.  Complaining of some back pain.  No focal findings on exam. CT head earlier today at [facility].  Will obtain plain film T spine.  Clinically intoxicated, will obs until ambulatory.   The patients results and plan were reviewed and discussed.   Any x-rays performed were independently reviewed by myself.   Differential diagnosis were considered with the presenting HPI.  Medications  acetaminophen (TYLENOL) tablet 1,000 mg (1,000 mg Oral Given 07/21/16 2209)    Vitals:   07/21/16  2156 07/21/16 2158  BP: 113/72   Pulse: (!) 109   Resp: 16   Temp: 98.4 F (36.9 C)   TempSrc: Oral   SpO2: 96%   Weight:  215 lb (97.5 kg)  Height:  6' (1.829 m)    Final diagnoses:  Acute alcoholic intoxication in alcoholism without complication (HCC)  Bilateral thoracic back pain, unspecified chronicity    Admission/ observation were discussed with the admitting physician, patient and/or family and they are comfortable with the plan.    Final Clinical Impressions(s) / ED Diagnoses   Final diagnoses:  Acute alcoholic intoxication in alcoholism without complication (HCC)  Bilateral thoracic back pain, unspecified chronicity    New Prescriptions New Prescriptions   No medications on file   I personally performed the services described in this documentation, which was scribed in my presence. The recorded information has been reviewed and is accurate.     Melene Plan, DO 07/21/16 2258

## 2016-07-21 NOTE — ED Notes (Signed)
ED Provider at bedside. 

## 2016-07-21 NOTE — Discharge Instructions (Addendum)
Follow up with your family doc.  °

## 2016-07-22 NOTE — ED Provider Notes (Signed)
6:40 AM Patient slept peacefully overnight. He is now awake and ambulating to the bathroom.     Paula LibraJohn Navpreet Szczygiel, MD 07/22/16 [id number]

## 2016-07-22 NOTE — ED Notes (Signed)
Pt ambulatory to bathroom and given water.

## 2016-10-15 IMAGING — CT CT ABD-PELV W/ CM
2 of 8 series · 16 of 46 positions shown, 18 images · IV contrast (ISOVUE)
Comparison: 07/29/2015

CLINICAL DATA: Generalized abdominal pain with nausea and vomiting
after drinking today. Previous history of pancreatitis.

EXAM:
CT ABDOMEN AND PELVIS WITH CONTRAST
TECHNIQUE: Multidetector CT imaging of the abdomen and pelvis was performed
using the standard protocol following bolus administration of
intravenous contrast.
CONTRAST:  100mL J3620B-JOO IOPAMIDOL (J3620B-JOO) INJECTION 61%

[Series 2: abd/pel with · axial · 0.82mm/px · z∈[-453,+7]mm · 13 of 108 slices shown, 15 images]
[im 8/108  soft-tissue]
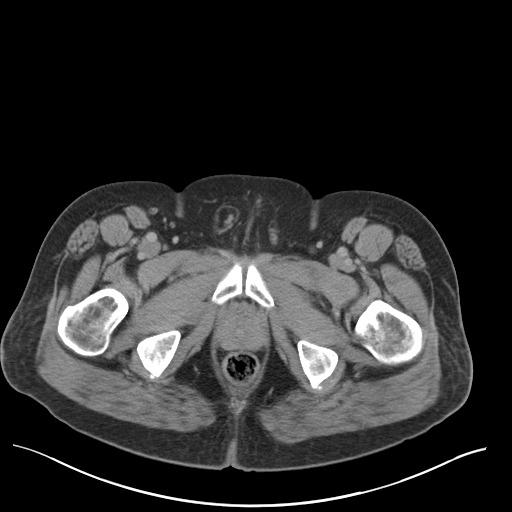
[im 8/108  bone]
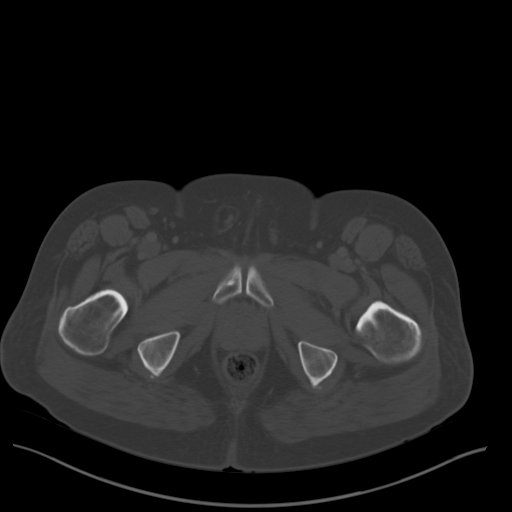
[im 16/108  soft-tissue]
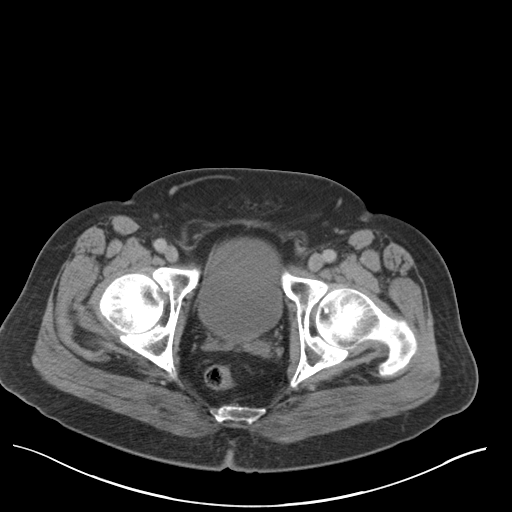
[im 23/108  soft-tissue]
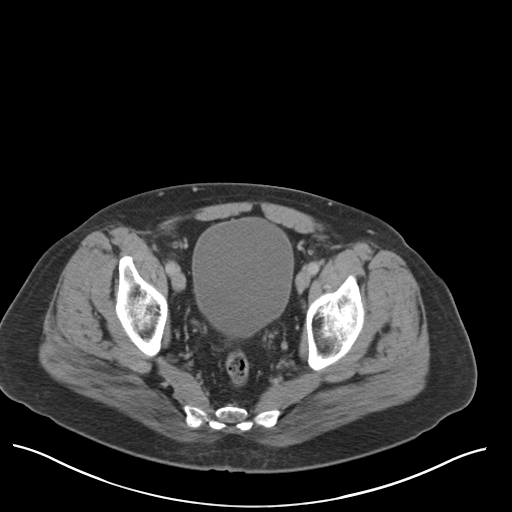
[im 31/108  soft-tissue]
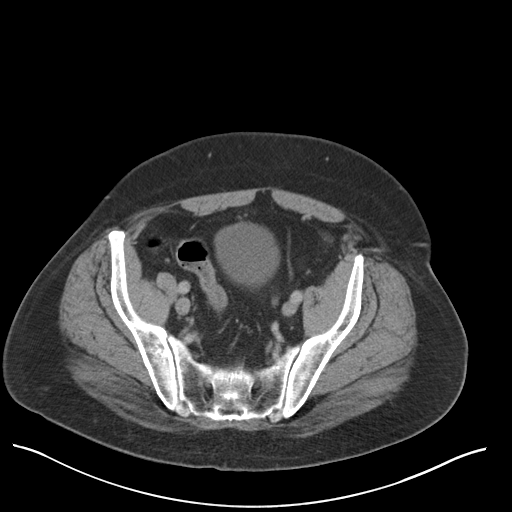
[im 39/108  soft-tissue]
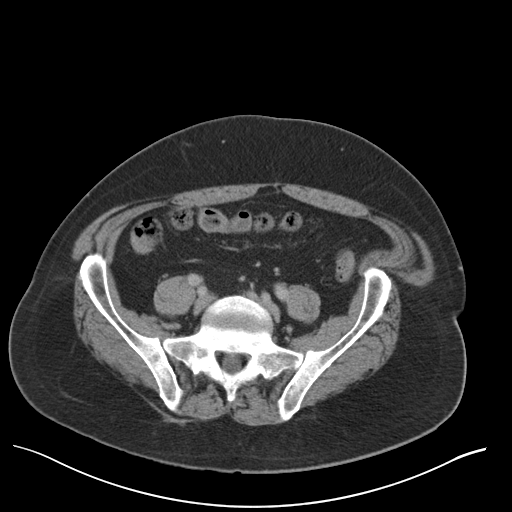
[im 46/108  soft-tissue]
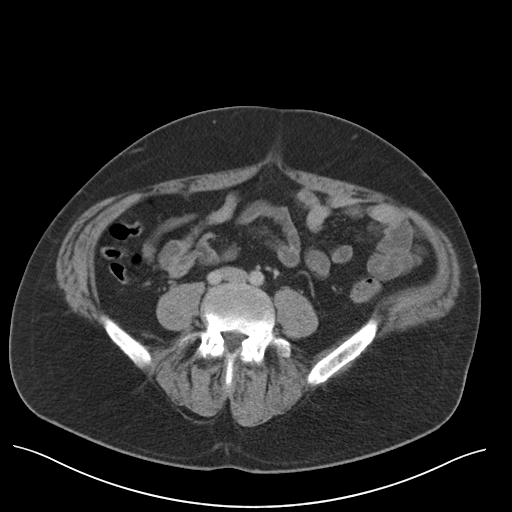
[im 54/108  soft-tissue]
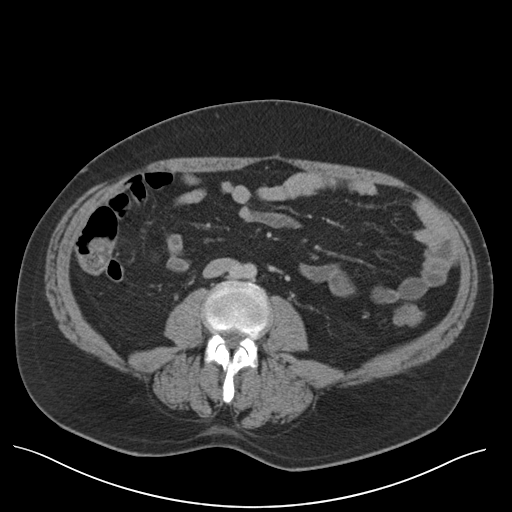
[im 62/108  soft-tissue]
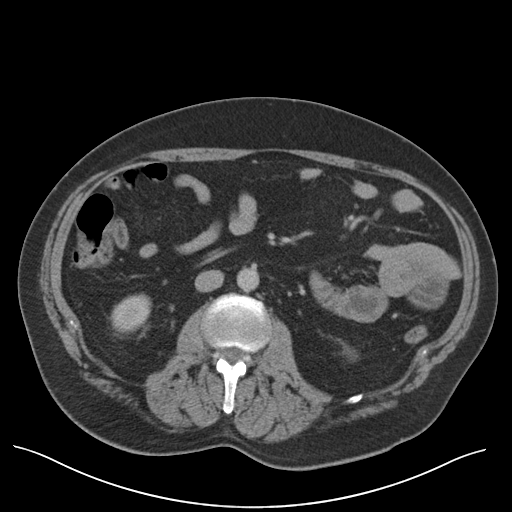
[im 69/108  soft-tissue]
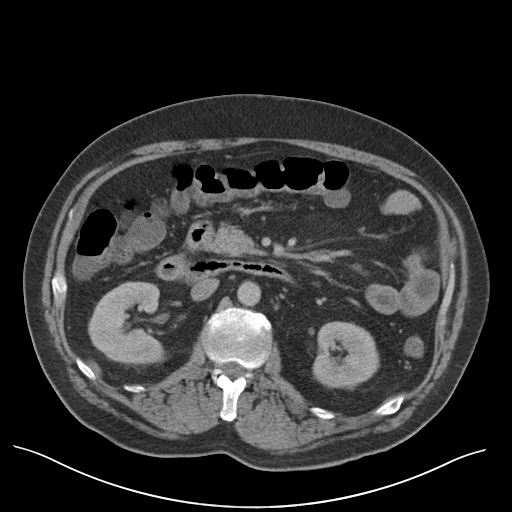
[im 69/108  bone]
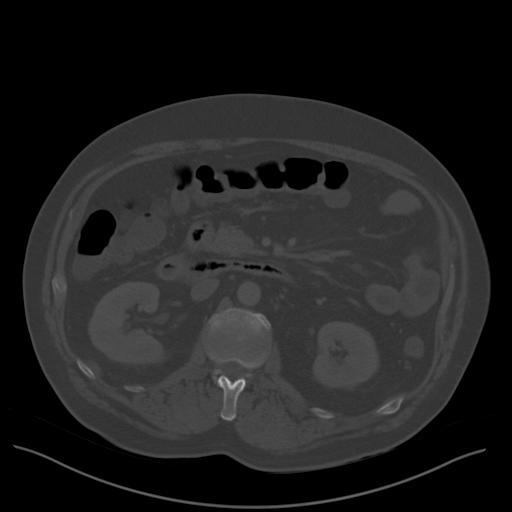
[im 77/108  soft-tissue]
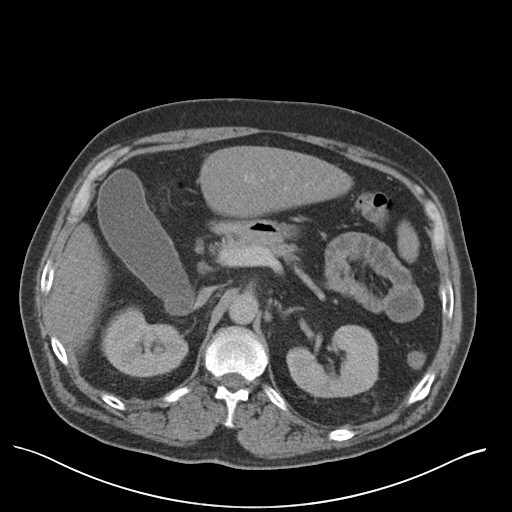
[im 85/108  soft-tissue]
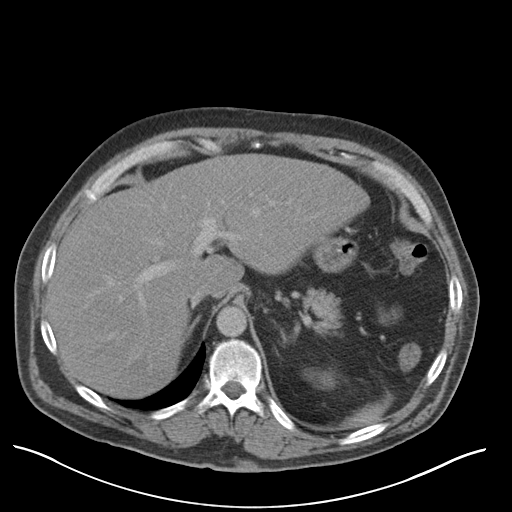
[im 92/108  soft-tissue]
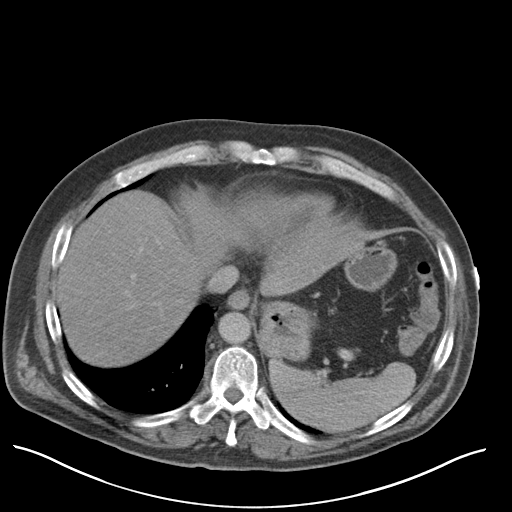
[im 100/108  soft-tissue]
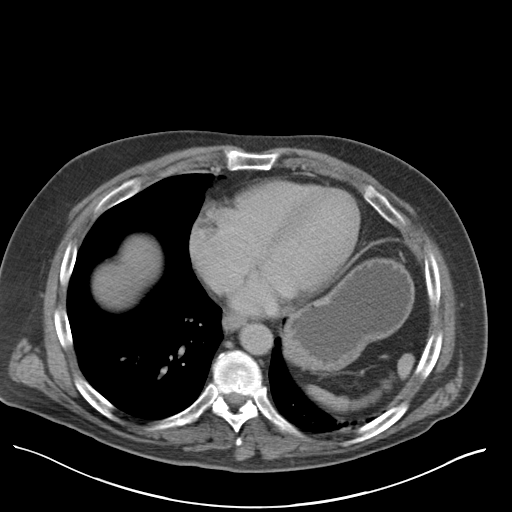

[Series 6: coronal a/|p · coronal · 0.78mm/px · 3 of 141 slices shown]
[im 47/141  soft-tissue]
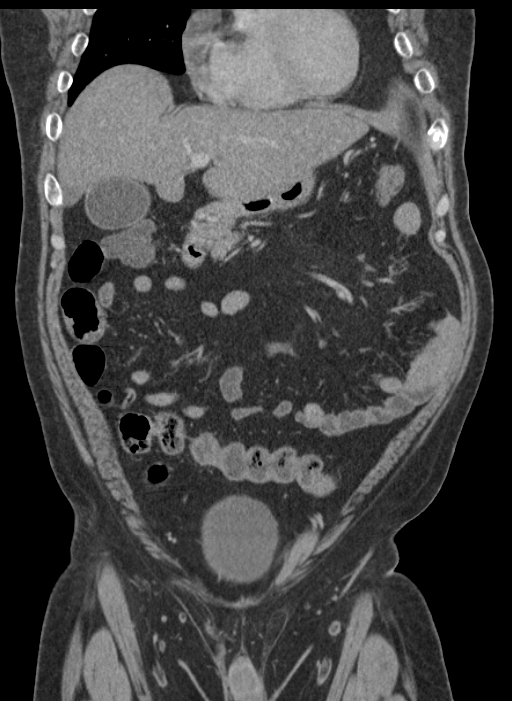
[im 63/141  soft-tissue]
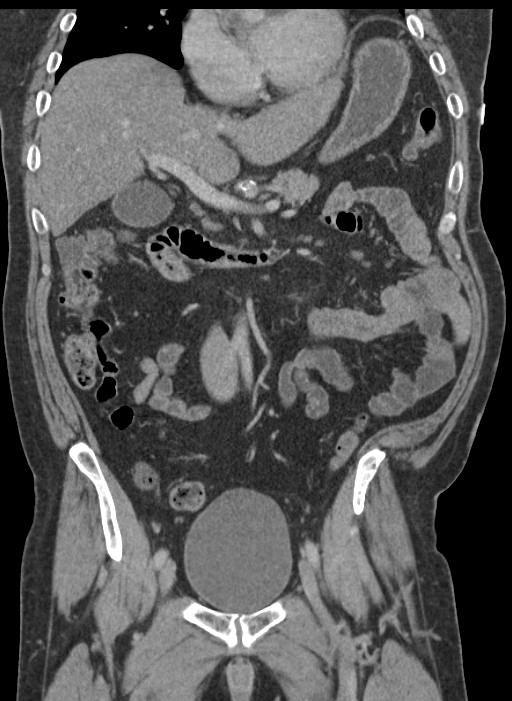
[im 78/141  soft-tissue]
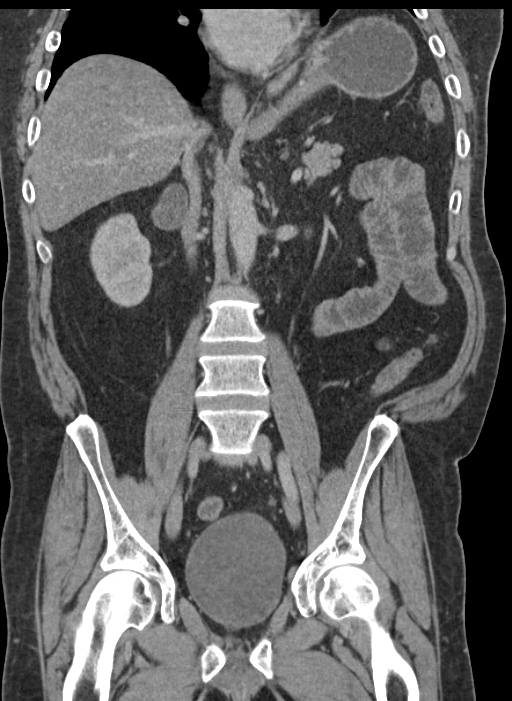

[16 of 46 positions shown; findings below may reference images not displayed]

FINDINGS: Atelectasis in the left lung base.

Mild diffuse fatty infiltration of the liver. No focal liver
lesions. Gallbladder is moderately distended but without wall
thickening, infiltration, or stone. No bile duct dilatation.
Pancreas appears normal. No peripancreatic infiltration. No
pancreatic ductal dilatation. Spleen, adrenal glands, kidneys,
abdominal aorta, inferior vena cava, and retroperitoneal lymph nodes
are unremarkable. Stomach, small bowel, and colon are mostly
decompressed. No free air or free fluid in the abdomen.

Pelvis: The appendix is normal. Prostate gland is enlarged at 4.8 cm
diameter. Bladder wall is not thickened. No pelvic mass or
lymphadenopathy.
IMPRESSION: No acute process demonstrated in the abdomen or pelvis. No changes
to suggest pancreatitis. No evidence of bowel obstruction or
inflammation. Diffuse fatty infiltration of the liver.

## 2016-11-27 DIAGNOSIS — E870 Hyperosmolality and hypernatremia: Secondary | ICD-10-CM

## 2016-11-27 DIAGNOSIS — L03213 Periorbital cellulitis: Secondary | ICD-10-CM | POA: Diagnosis not present

## 2016-11-27 DIAGNOSIS — F1021 Alcohol dependence, in remission: Secondary | ICD-10-CM

## 2016-11-28 DIAGNOSIS — L03213 Periorbital cellulitis: Secondary | ICD-10-CM | POA: Diagnosis not present

## 2016-11-28 DIAGNOSIS — F1021 Alcohol dependence, in remission: Secondary | ICD-10-CM | POA: Diagnosis not present

## 2016-11-28 DIAGNOSIS — E870 Hyperosmolality and hypernatremia: Secondary | ICD-10-CM | POA: Diagnosis not present

## 2016-11-29 DIAGNOSIS — E870 Hyperosmolality and hypernatremia: Secondary | ICD-10-CM | POA: Diagnosis not present

## 2016-11-29 DIAGNOSIS — F1021 Alcohol dependence, in remission: Secondary | ICD-10-CM | POA: Diagnosis not present

## 2016-11-29 DIAGNOSIS — L03213 Periorbital cellulitis: Secondary | ICD-10-CM | POA: Diagnosis not present

## 2017-03-25 ENCOUNTER — Emergency Department (HOSPITAL_COMMUNITY)
Admission: EM | Admit: 2017-03-25 | Discharge: 2017-03-25 | Disposition: A | Discharge status: Home / Self Care | Payer: Medicaid Other | Attending: Emergency Medicine | Admitting: Emergency Medicine

## 2017-03-25 ENCOUNTER — Other Ambulatory Visit: Payer: Self-pay

## 2017-03-25 ENCOUNTER — Encounter (HOSPITAL_COMMUNITY): Payer: Self-pay

## 2017-03-25 DIAGNOSIS — F10920 Alcohol use, unspecified with intoxication, uncomplicated: Secondary | ICD-10-CM | POA: Insufficient documentation

## 2017-03-25 DIAGNOSIS — Z79899 Other long term (current) drug therapy: Secondary | ICD-10-CM | POA: Insufficient documentation

## 2017-03-25 NOTE — Discharge Instructions (Signed)
STOP DRINKING ALCOHOL! Use the list of resources below to find help with your alcohol abuse, and to find shelters to stay at. Return to the ER for emergent changes or worsening medical condition.

## 2017-03-25 NOTE — ED Provider Notes (Signed)
[facility]-EMERGENCY DEPT Provider Note   CSN: [id number] Arrival date & time: 03/25/17  1333     History   Chief Complaint Chief Complaint  Patient presents with  . Alcohol Intoxication    HPI Craig [name] is a 57 y.o. male with a PMHx of alcoholism, bipolar 1 disorder, and other conditions listed below, who presents to the ED via EMS for complaints of intoxication. LEVEL 5 CAVEAT DUE INTOXICATION, most of history is provided by EMS personnel.  Per EMS, patient has been transported 3 times this week ever since being kicked out of his [place] shelter.  He apparently told nursing staff upon arrival that he just wanted to have a place to sleep, and that he had no complaints.  Patient is able to report that he had approximately 3-4 bottles of wine, but cannot recall what time he finished drinking them, and can't really provide much more information because he rambles and starts crying, and can't be understood. He knows he's in [place] but when asked the year, he doesn't really answer and he just keeps crying and rambling incoherently.  He can't provide any further history and cannot participate in the ROS questioning due to his intoxicated state.    The history is provided by the patient, medical records and the EMS personnel. The history is limited by the condition of the patient. No language interpreter was used.  Alcohol Intoxication  This is a recurrent problem. The current episode started less than 1 hour ago. The problem occurs constantly. The problem has not changed since onset.The symptoms are aggravated by drinking. Nothing relieves the symptoms. He has tried nothing for the symptoms. The treatment provided no relief.    Past Medical History:  Diagnosis Date  . Back pain   . Bipolar 1 disorder (HCC)   . ETOH abuse   . Neck pain   . Pancreatitis   . Withdrawal seizures [facility])     Patient Active Problem List   Diagnosis Date Noted  . Bipolar  affective disorder, current episode depressed (HCC) 10/17/2015  . Alcohol abuse with intoxication (HCC) 06/09/2012    Class: Acute  . Alcohol dependence (HCC) 06/09/2012    Class: Chronic  . Bipolar affective disorder, current episode hypomanic (HCC) 06/09/2012    Class: Chronic    History reviewed. No pertinent surgical history.     Home Medications    Prior to Admission medications   Medication Sig Start Date End Date Taking? Authorizing Provider  cloNIDine (CATAPRES) 0.2 MG tablet Take 0.2 mg by mouth 2 (two) times daily.    [provider]  folic acid (FOLVITE) 1 MG tablet Take 1 mg by mouth daily.    [provider]  gabapentin (NEURONTIN) 100 MG capsule Take 2 capsules (200 mg total) by mouth 3 (three) times daily. 10/18/15   Thermon Leyland, NP  hydrOXYzine (ATARAX/VISTARIL) 25 MG tablet Take 25 mg by mouth every 6 (six) hours as needed for anxiety (and/or agitation or CIWA < or = 10).    [provider]  Multiple Vitamin (MULTIVITAMIN WITH MINERALS) TABS tablet Take 1 tablet by mouth daily.    [provider]  QUEtiapine (SEROQUEL) 25 MG tablet Take 1 tablet (25 mg total) by mouth 3 (three) times daily. 10/18/15   Thermon Leyland, NP  thiamine (VITAMIN B-1) 100 MG tablet Take 100 mg by mouth daily.    [provider]  traZODone (DESYREL) 100 MG tablet Take 1 tablet (  100 mg total) by mouth at bedtime. 10/18/15   Thermon Leyland, NP    Family History Family History  Problem Relation Age of Onset  . Hypertension Mother     Social History Social History   Tobacco Use  . Smoking status: Never Smoker  . Smokeless tobacco: Never Used  Substance Use Topics  . Alcohol use: Yes    Comment: daily  . Drug use: No     Allergies   Haldol [haloperidol]; Thorazine [chlorpromazine]; Hydrocodone; and Lithium   Review of Systems Review of Systems  Unable to perform ROS: Other  Psychiatric/Behavioral:       +intoxication   LEVEL 5  CAVEAT DUE INTOXICATION  Physical Exam Updated Vital Signs BP 116/88 (BP Location: Left Arm)   Pulse (!) 103   Temp 98.2 F (36.8 C)   SpO2 99%   Physical Exam  Constitutional: Vital signs are normal. He appears well-developed and well-nourished.  Non-toxic appearance. No distress.  Afebrile, nontoxic, NAD, laying on the stretcher sleeping  HENT:  Head: Normocephalic and atraumatic.  Mouth/Throat: Mucous membranes are normal.  Eyes: Conjunctivae and EOM are normal. Right eye exhibits no discharge. Left eye exhibits no discharge.  Neck: Normal range of motion. Neck supple.  Cardiovascular: Normal rate and intact distal pulses.  Pulmonary/Chest: Effort normal. No respiratory distress.  Abdominal: Normal appearance. He exhibits no distension.  Musculoskeletal: Normal range of motion.  Neurological: He is alert. He has normal strength. No sensory deficit.  Oriented to place and person, unable to assess if oriented to time because pt so intoxicated that he rambles and doesn't really answer the questions he's asked.   Skin: Skin is warm, dry and intact. No rash noted.  Psychiatric: His speech is slurred.  Rambling and somewhat slurred speech, appears clearly intoxicated. Unable to get any information from pt initially  Nursing note and vitals reviewed.    ED Treatments / Results  Labs (all labs ordered are listed, but only abnormal results are displayed) Labs Reviewed - No data to display  EKG  EKG Interpretation None       Radiology No results found.  Procedures Procedures (including critical care time)  Medications Ordered in ED Medications - No data to display   Initial Impression / Assessment and Plan / ED Course  I have reviewed the triage vital signs and the nursing notes.  Pertinent labs & imaging results that were available during my care of the patient were reviewed by me and considered in my medical decision making (see chart for details).     57 y.o.  male here with intoxication. Per EMS, this is his 3rd transport this week; apparently he was kicked out of the shelter in [place]. His history is severely limited due to his intoxicated state. Unable to get any information from him, aside from the fact that he drank 3-4 bottles of wine today (but can't tell me when he stopped drinking). He knows he's in [place] but he rambles when he tries to tell me what year it is. He's very difficult to understand due to his intoxicated state. He becomes tearful but I can't understand what he's saying. Will need to let him sober up and then try to reassess him. Will hold off on any labs/etc for now.   5:45 PM Pt sobered up, ambulatory with steady gait, much more coherent. Denies any acute medical complaints, and requesting to go home. Pt does not appear to be a threat to himself or others  at this time, and is clinically sober. Advised alcohol cessation, list of shelters given as well as resource guide for alcohol abuse help. I explained the diagnosis and have given explicit precautions to return to the ER including for any other new or worsening symptoms. The patient understands and accepts the medical plan as it's been dictated and I have answered their questions. Discharge instructions concerning home care and prescriptions have been given. The patient is STABLE and is discharged to home in good condition.    Final Clinical Impressions(s) / ED Diagnoses   Final diagnoses:  Alcoholic intoxication without complication [name](HCC)    ED Discharge Orders    [address]       Braylin Xu, [place] Harbor[place], [place] JerseyPA-C 03/25/17 1746    Arby BarrettePfeiffer, Marcy, MD 03/25/17 [phone]

## 2017-03-25 NOTE — ED Triage Notes (Signed)
Pt BIB [facility]. [place] shelter [place]. This is his 3rd hospital transport this week. He appears to be highly intoxicated. No other complaints in triage.

## 2017-03-25 NOTE — ED Notes (Signed)
Patient resting comfortably

## 2017-03-25 NOTE — ED Notes (Signed)
Pt offered food/drink but pt refused.

## 2017-03-25 NOTE — ED Notes (Signed)
Patient resting. Will obtain vital signs once patient wakes up.

## 2017-03-26 ENCOUNTER — Emergency Department (HOSPITAL_COMMUNITY)
Admission: EM | Admit: 2017-03-26 | Discharge: 2017-03-26 | Disposition: A | Discharge status: Home / Self Care | Payer: Medicaid Other | Attending: Emergency Medicine | Admitting: Emergency Medicine

## 2017-03-26 ENCOUNTER — Emergency Department (HOSPITAL_COMMUNITY)
Admission: EM | Admit: 2017-03-26 | Discharge: 2017-03-26 | Disposition: A | Discharge status: Home / Self Care | Payer: Medicaid Other | Source: Home / Self Care | Attending: Emergency Medicine | Admitting: Emergency Medicine

## 2017-03-26 ENCOUNTER — Encounter (HOSPITAL_COMMUNITY): Payer: Self-pay | Admitting: Emergency Medicine

## 2017-03-26 ENCOUNTER — Emergency Department (HOSPITAL_COMMUNITY)
Admission: EM | Admit: 2017-03-26 | Discharge: 2017-03-27 | Disposition: A | Discharge status: Home / Self Care | Payer: Medicaid Other | Source: Home / Self Care | Attending: Emergency Medicine | Admitting: Emergency Medicine

## 2017-03-26 DIAGNOSIS — Z590 Homelessness: Secondary | ICD-10-CM | POA: Diagnosis not present

## 2017-03-26 DIAGNOSIS — F10120 Alcohol abuse with intoxication, uncomplicated: Secondary | ICD-10-CM | POA: Insufficient documentation

## 2017-03-26 DIAGNOSIS — F10129 Alcohol abuse with intoxication, unspecified: Secondary | ICD-10-CM | POA: Diagnosis present

## 2017-03-26 DIAGNOSIS — F10920 Alcohol use, unspecified with intoxication, uncomplicated: Secondary | ICD-10-CM

## 2017-03-26 DIAGNOSIS — E876 Hypokalemia: Secondary | ICD-10-CM

## 2017-03-26 DIAGNOSIS — Z79899 Other long term (current) drug therapy: Secondary | ICD-10-CM | POA: Diagnosis not present

## 2017-03-26 LAB — COMPREHENSIVE METABOLIC PANEL
ALT: 25 U/L (ref 17–63)
ANION GAP: 11 (ref 5–15)
AST: 35 U/L (ref 15–41)
Albumin: 4.2 g/dL (ref 3.5–5.0)
Alkaline Phosphatase: 80 U/L (ref 38–126)
BUN: 12 mg/dL (ref 6–20)
CHLORIDE: 104 mmol/L (ref 101–111)
CO2: 23 mmol/L (ref 22–32)
Calcium: 8.3 mg/dL — ABNORMAL LOW (ref 8.9–10.3)
Creatinine, Ser: 0.72 mg/dL (ref 0.61–1.24)
GFR calc non Af Amer: >60 mL/min (ref >60)
Glucose, Bld: 111 mg/dL — ABNORMAL HIGH (ref 65–99)
Potassium: 3.3 mmol/L — ABNORMAL LOW (ref 3.5–5.1)
SODIUM: 138 mmol/L (ref 135–145)
Total Bilirubin: 0.6 mg/dL (ref 0.3–1.2)
Total Protein: 7.6 g/dL (ref 6.5–8.1)

## 2017-03-26 LAB — CBC
HEMATOCRIT: 37.5 % — AB (ref 39.0–52.0)
Hemoglobin: 12.7 g/dL — ABNORMAL LOW (ref 13.0–17.0)
MCH: 30.6 pg (ref 26.0–34.0)
MCHC: 33.9 g/dL (ref 30.0–36.0)
MCV: 90.4 fL (ref 78.0–100.0)
Platelets: 250 10*3/uL (ref 150–400)
RBC: 4.15 MIL/uL — AB (ref 4.22–5.81)
RDW: 13.3 % (ref 11.5–15.5)
WBC: 6.4 10*3/uL (ref 4.0–10.5)

## 2017-03-26 LAB — RAPID URINE DRUG SCREEN, HOSP PERFORMED
AMPHETAMINES: NOT DETECTED
BARBITURATES: NOT DETECTED
BENZODIAZEPINES: POSITIVE — AB
Cocaine: NOT DETECTED
Opiates: NOT DETECTED
Tetrahydrocannabinol: NOT DETECTED

## 2017-03-26 LAB — ETHANOL: Alcohol, Ethyl (B): 325 mg/dL (ref <10)

## 2017-03-26 MED ORDER — SODIUM CHLORIDE 0.9 % IV BOLUS (SEPSIS): 1000.0000 mL | Freq: Once | INTRAVENOUS | Status: DC

## 2017-03-26 MED ORDER — CHLORDIAZEPOXIDE HCL 25 MG PO CAPS: ORAL_CAPSULE | ORAL | 0 refills | Status: AC

## 2017-03-26 MED ORDER — CHLORDIAZEPOXIDE HCL 25 MG PO CAPS: ORAL_CAPSULE | ORAL | 0 refills | Status: DC

## 2017-03-26 MED ORDER — LORAZEPAM 2 MG/ML IJ SOLN: 1.0000 mg | Freq: Once | INTRAMUSCULAR | Status: DC

## 2017-03-26 NOTE — ED Provider Notes (Signed)
[facility]-EMERGENCY DEPT Provider Note   CSN: [id number] Arrival date & time: 03/26/17  0112     History   Chief Complaint Chief Complaint  Patient presents with  . Alcohol Intoxication    HPI Craig [name] is a 57 y.o. male who presents with alcohol intoxication. PMH significant for alcoholism, homelessness, alcohol withdrawal seizures. He states that he drank "beaucoups" amount of alcohol tonight and "there is never enough". He was seen and discharged from the ED ~7 hours ago. He called EMS because he is worried that the amount of alcohol that he drank will cause him to withdraw. He feels shaky and nauseous. He is homeless and has not been staying in a shelter.    Level 5 Caveat due to intoxication  HPI  Past Medical History:  Diagnosis Date  . Back pain   . Bipolar 1 disorder (HCC)   . ETOH abuse   . Neck pain   . Pancreatitis   . Withdrawal seizures [facility](HCC)     Patient Active Problem List   Diagnosis Date Noted  . Bipolar affective disorder, current episode depressed (HCC) 10/17/2015  . Alcohol abuse with intoxication (HCC) 06/09/2012    Class: Acute  . Alcohol dependence (HCC) 06/09/2012    Class: Chronic  . Bipolar affective disorder, current episode hypomanic (HCC) 06/09/2012    Class: Chronic    History reviewed. No pertinent surgical history.     Home Medications    Prior to Admission medications   Medication Sig Start Date End Date Taking? Authorizing Provider  cloNIDine (CATAPRES) 0.2 MG tablet Take 0.2 mg by mouth 2 (two) times daily.    [provider]  folic acid (FOLVITE) 1 MG tablet Take 1 mg by mouth daily.    [provider]  gabapentin (NEURONTIN) 100 MG capsule Take 2 capsules (200 mg total) by mouth 3 (three) times daily. 10/18/15   Thermon Leylandavis, Laura A, NP  hydrOXYzine (ATARAX/VISTARIL) 25 MG tablet Take 25 mg by mouth every 6 (six) hours as needed for anxiety (and/or agitation or CIWA < or = 10).    [provider]  Multiple Vitamin (MULTIVITAMIN WITH MINERALS) TABS tablet Take 1 tablet by mouth daily.    [provider]  QUEtiapine (SEROQUEL) 25 MG tablet Take 1 tablet (25 mg total) by mouth 3 (three) times daily. 10/18/15   Thermon Leylandavis, Laura A, NP  thiamine (VITAMIN B-1) 100 MG tablet Take 100 mg by mouth daily.    [provider]  traZODone (DESYREL) 100 MG tablet Take 1 tablet (100 mg total) by mouth at bedtime. 10/18/15   Thermon Leylandavis, Laura A, NP    Family History Family History  Problem Relation Age of Onset  . Hypertension Mother     Social History Social History   Tobacco Use  . Smoking status: Never Smoker  . Smokeless tobacco: Never Used  Substance Use Topics  . Alcohol use: Yes    Comment: daily  . Drug use: No     Allergies   Haldol [haloperidol]; Thorazine [chlorpromazine]; Hydrocodone; and Lithium   Review of Systems Review of Systems  Unable to perform ROS: Other (intoxicated)     Physical Exam Updated Vital Signs BP 126/90 (BP Location: Right Arm)   Pulse (!) 103   Temp 98.3 F (36.8 C) (Oral)   Resp 18   SpO2 98%   Physical Exam  Constitutional: He is oriented to person, place, and time. He appears well-developed and well-nourished. No distress.  Sleeping. Arouses to voice. Intoxicated but cooperative and tearful  HENT:  Head: Normocephalic and atraumatic.  Eyes: Conjunctivae are normal. Pupils are equal, round, and reactive to light. Right eye exhibits no discharge. Left eye exhibits no discharge. No scleral icterus.  Neck: Normal range of motion.  Cardiovascular: Regular rhythm. Tachycardia present. Exam reveals no gallop and no friction rub.  No murmur heard. Pulmonary/Chest: Effort normal and breath sounds normal. No stridor. No respiratory distress. He has no wheezes. He has no rales. He exhibits no tenderness.  Abdominal: Soft. Bowel sounds are normal. He exhibits no distension. There is no tenderness.  Neurological: He is alert  and oriented to person, place, and time.  Skin: Skin is warm and dry.  Psychiatric: His behavior is normal. His speech is slurred. He exhibits a depressed mood.  Nursing note and vitals reviewed.    ED Treatments / Results  Labs (all labs ordered are listed, but only abnormal results are displayed) Labs Reviewed - No data to display  EKG  EKG Interpretation None       Radiology No results found.  Procedures Procedures (including critical care time)  Medications Ordered in ED Medications - No data to display   Initial Impression / Assessment and Plan / ED Course  I have reviewed the triage vital signs and the nursing notes.  Pertinent labs & imaging results that were available during my care of the patient were reviewed by me and considered in my medical decision making (see chart for details).  57 year old male presents again with ETOH intoxication. Exam is overall unchanged from 7 hours ago. He has slept for most of his ED stay without any issues. He is not actively withdrawing. He tolerated PO. Will d/c. Discussed with Dr. Preston Fleeting who is in agreement with plan.   Final Clinical Impressions(s) / ED Diagnoses   Final diagnoses:  Alcoholic intoxication without complication [facility])    ED Discharge Orders    None       [name], PA-C 03/26/17 0706    Dione Booze, MD 03/26/17 [phone]

## 2017-03-26 NOTE — ED Triage Notes (Addendum)
Pt reports he is "sick".  Pt reports he has had 5-6 pints today.  Pt is tearful in triage.  "I'm so sick, I'm dying."  Denies S/I.  Speech is slurred.

## 2017-03-26 NOTE — ED Notes (Signed)
Unable to CIWA pt not awake enough at this time.

## 2017-03-26 NOTE — ED Provider Notes (Signed)
[facility]-EMERGENCY DEPT Provider Note   CSN: [id number] Arrival date & time: 03/26/17  1354     History   Chief Complaint Chief Complaint  Patient presents with  . Fall  . Alcohol Intoxication    HPI Dannielle HuhDanny B Bordner is a 57 y.o. male.  Pt presents to the ED today with fall and alcohol intoxication.  This is pt's 3rd visit to the ED this year and it is Jan 5.  The pt was d/c from the ED this morning.  The pt left and started drinking.  He drank all day and was found on the ground outside.  There was concern that he fell, but pt denies any new pain.  Shoulder pain that he told EMS he had is chronic from an old injury.  Pt awake and alert, but smells strongly of alcohol.        Past Medical History:  Diagnosis Date  . Back pain   . Bipolar 1 disorder (HCC)   . ETOH abuse   . Neck pain   . Pancreatitis   . Withdrawal seizures [facility](HCC)     Patient Active Problem List   Diagnosis Date Noted  . Bipolar affective disorder, current episode depressed (HCC) 10/17/2015  . Alcohol abuse with intoxication (HCC) 06/09/2012    Class: Acute  . Alcohol dependence (HCC) 06/09/2012    Class: Chronic  . Bipolar affective disorder, current episode hypomanic (HCC) 06/09/2012    Class: Chronic    History reviewed. No pertinent surgical history.     Home Medications    Prior to Admission medications   Medication Sig Start Date End Date Taking? Authorizing Provider  cloNIDine (CATAPRES) 0.2 MG tablet Take 0.2 mg by mouth 2 (two) times daily.    [provider]  folic acid (FOLVITE) 1 MG tablet Take 1 mg by mouth daily.    [provider]  gabapentin (NEURONTIN) 100 MG capsule Take 2 capsules (200 mg total) by mouth 3 (three) times daily. 10/18/15   Thermon Leylandavis, Laura A, NP  hydrOXYzine (ATARAX/VISTARIL) 25 MG tablet Take 25 mg by mouth every 6 (six) hours as needed for anxiety (and/or agitation or CIWA < or = 10).    [provider]  Multiple  Vitamin (MULTIVITAMIN WITH MINERALS) TABS tablet Take 1 tablet by mouth daily.    [provider]  QUEtiapine (SEROQUEL) 25 MG tablet Take 1 tablet (25 mg total) by mouth 3 (three) times daily. 10/18/15   Thermon Leylandavis, Laura A, NP  thiamine (VITAMIN B-1) 100 MG tablet Take 100 mg by mouth daily.    [provider]  traZODone (DESYREL) 100 MG tablet Take 1 tablet (100 mg total) by mouth at bedtime. 10/18/15   Thermon Leylandavis, Laura A, NP    Family History Family History  Problem Relation Age of Onset  . Hypertension Mother     Social History Social History   Tobacco Use  . Smoking status: Never Smoker  . Smokeless tobacco: Never Used  Substance Use Topics  . Alcohol use: Yes    Comment: daily  . Drug use: No     Allergies   Haldol [haloperidol]; Thorazine [chlorpromazine]; Hydrocodone; and Lithium   Review of Systems Review of Systems  All other systems reviewed and are negative.    Physical Exam Updated Vital Signs BP (!) 145/95   Pulse 97   Temp 98.3 F (36.8 C) (Oral)   Resp 16   SpO2 94%   Physical Exam  Constitutional: He  is oriented to person, place, and time. He appears well-developed and well-nourished.  HENT:  Head: Normocephalic and atraumatic.  Right Ear: External ear normal.  Left Ear: External ear normal.  Nose: Nose normal.  Mouth/Throat: Oropharynx is clear and moist.  Eyes: Conjunctivae and EOM are normal. Pupils are equal, round, and reactive to light.  Neck: Normal range of motion. Neck supple.  Cardiovascular: Normal rate, regular rhythm, normal heart sounds and intact distal pulses.  Pulmonary/Chest: Effort normal and breath sounds normal.  Abdominal: Soft. Bowel sounds are normal.  Musculoskeletal: Normal range of motion.  Neurological: He is alert and oriented to person, place, and time.  Skin: Skin is warm and dry. Capillary refill takes less than 2 seconds.  Psychiatric: His speech is slurred. He is slowed.  Nursing note and vitals  reviewed.    ED Treatments / Results  Labs (all labs ordered are listed, but only abnormal results are displayed) Labs Reviewed - No data to display  EKG  EKG Interpretation None       Radiology No results found.  Procedures Procedures (including critical care time)  Medications Ordered in ED Medications - No data to display   Initial Impression / Assessment and Plan / ED Course  I have reviewed the triage vital signs and the nursing notes.  Pertinent labs & imaging results that were available during my care of the patient were reviewed by me and considered in my medical decision making (see chart for details).    Pt is clinically intoxicated.  I will let him sleep until he is able to walk and then he can go home. Final Clinical Impressions(s) / ED Diagnoses   Final diagnoses:  Alcoholic intoxication without complication [facility])    ED Discharge Orders    None       Jacalyn Lefevre, MD 03/26/17 1505

## 2017-03-26 NOTE — ED Notes (Signed)
Pt ambulating in hall without assist, able to maintain gait and balance. Dr Silverio LayYao updated. Pt to be discharged

## 2017-03-26 NOTE — ED Notes (Signed)
Pt sleeping quietly at this time. VSS. Will continue to monitor

## 2017-03-26 NOTE — Discharge Instructions (Addendum)
Take librium as needed for withdrawals.   See your doctor  Return to ER if you have seizures, withdrawal, falls.

## 2017-03-26 NOTE — ED Provider Notes (Signed)
  Physical Exam  BP (!) 145/95   Pulse 97   Temp 98.3 F (36.8 C) (Oral)   Resp 16   SpO2 94%   Physical Exam  ED Course/Procedures     Procedures  MDM  Patient care assumed at 4 pm. Patient is chronic alcoholic and came in 3 times last several days for alcohol intoxication. On exam, no obvious scalp hematoma. Appears slightly intoxicated but ambulated well in the ED. Some tremors but vitals stable. Denies hallucinations. Since patient ambulatory, can be discharged. Will give librium taper but patient states that he just wants to go and drink more alcohol.       Charlynne PanderYao, Jamarquis Crull Hsienta, MD 03/26/17 [phone]

## 2017-03-26 NOTE — ED Triage Notes (Signed)
Patient here via EMS with complaints of ETOH. Found at [facility]. States "Im withdrawing".

## 2017-03-26 NOTE — Discharge Instructions (Signed)
Please return if worsening.

## 2017-03-26 NOTE — ED Triage Notes (Signed)
Pt picked up by EMS and brought to ED post fall. Pt admits to drinking "a bottle" today. Pt c/o fall with L posterior shoulder. Pt denies hitting head or LOC. EMS reports they found pt on ground in fetal position. Pt talking to EMS and staff, slurring his words. Pt in NAD

## 2017-03-27 ENCOUNTER — Emergency Department (HOSPITAL_COMMUNITY)
Admission: EM | Admit: 2017-03-27 | Discharge: 2017-03-27 | Discharge status: Against Medical Advice | Payer: Medicaid Other | Source: Home / Self Care

## 2017-03-27 ENCOUNTER — Encounter (HOSPITAL_COMMUNITY): Payer: Self-pay | Admitting: Emergency Medicine

## 2017-03-27 ENCOUNTER — Other Ambulatory Visit: Payer: Self-pay

## 2017-03-27 NOTE — ED Provider Notes (Signed)
[facility] EMERGENCY DEPARTMENT Provider Note   CSN: [id number] Arrival date & time: 03/26/17  1955     History   Chief Complaint Chief Complaint  Patient presents with  . Alcohol Intoxication    HPI Craig [name] is a 57 y.o. male who presents with alcohol intoxication. PMH significant for alcoholism. He was seen by me last night for the same and once after being discharged this morning. He has continued to drink alcohol since discharge this afternoon. He denies SI/HI but states he feels very depressed and that alcohol is the only thing that is helping him get through this right now. He states he has had a falling out with his mother and problems with his girlfriend as well. He denies any other complaints.  HPI  Past Medical History:  Diagnosis Date  . Back pain   . Bipolar 1 disorder (HCC)   . ETOH abuse   . Neck pain   . Pancreatitis   . Withdrawal seizures [facility])     Patient Active Problem List   Diagnosis Date Noted  . Bipolar affective disorder, current episode depressed (HCC) 10/17/2015  . Alcohol abuse with intoxication (HCC) 06/09/2012    Class: Acute  . Alcohol dependence (HCC) 06/09/2012    Class: Chronic  . Bipolar affective disorder, current episode hypomanic (HCC) 06/09/2012    Class: Chronic    History reviewed. No pertinent surgical history.     Home Medications    Prior to Admission medications   Medication Sig Start Date End Date Taking? Authorizing Provider  chlordiazePOXIDE (LIBRIUM) 25 MG capsule 50mg  PO TID x 1D, then 25-50mg  PO BID X 1D, then 25-50mg  PO QD X 1D 03/26/17   Charlynne Pander, MD  cloNIDine (CATAPRES) 0.2 MG tablet Take 0.2 mg by mouth 2 (two) times daily.    [provider]  folic acid (FOLVITE) 1 MG tablet Take 1 mg by mouth daily.    [provider]  gabapentin (NEURONTIN) 100 MG capsule Take 2 capsules (200 mg total) by mouth 3 (three) times daily. 10/18/15   Thermon Leyland, NP  hydrOXYzine  (ATARAX/VISTARIL) 25 MG tablet Take 25 mg by mouth every 6 (six) hours as needed for anxiety (and/or agitation or CIWA < or = 10).    [provider]  Multiple Vitamin (MULTIVITAMIN WITH MINERALS) TABS tablet Take 1 tablet by mouth daily.    [provider]  QUEtiapine (SEROQUEL) 25 MG tablet Take 1 tablet (25 mg total) by mouth 3 (three) times daily. 10/18/15   Thermon Leyland, NP  thiamine (VITAMIN B-1) 100 MG tablet Take 100 mg by mouth daily.    [provider]  traZODone (DESYREL) 100 MG tablet Take 1 tablet (100 mg total) by mouth at bedtime. 10/18/15   Thermon Leyland, NP    Family History Family History  Problem Relation Age of Onset  . Hypertension Mother     Social History Social History   Tobacco Use  . Smoking status: Never Smoker  . Smokeless tobacco: Never Used  Substance Use Topics  . Alcohol use: Yes    Comment: daily  . Drug use: No     Allergies   Haldol [haloperidol]; Thorazine [chlorpromazine]; Hydrocodone; and Lithium   Review of Systems Review of Systems  Constitutional: Negative for fever.  Respiratory: Negative for shortness of breath.   Cardiovascular: Negative for chest pain.  Gastrointestinal: Negative for abdominal pain, nausea and vomiting.  Psychiatric/Behavioral: Positive for dysphoric  mood. Negative for suicidal ideas.  All other systems reviewed and are negative.    Physical Exam Updated Vital Signs BP (!) 147/86 (BP Location: Right Arm)   Pulse (!) 105   Temp 98.8 F (37.1 C) (Oral)   Resp 18   SpO2 95%   Physical Exam  Constitutional: He is oriented to person, place, and time. He appears well-developed and well-nourished. No distress.  Clinically intoxicated. Wearing same clothes since yesterday. Tearful and coherent  HENT:  Head: Normocephalic and atraumatic.  Eyes: Conjunctivae are normal. Pupils are equal, round, and reactive to light. Right eye exhibits no discharge. Left eye exhibits no discharge. No  scleral icterus.  Neck: Normal range of motion.  Cardiovascular: Normal rate and regular rhythm. Exam reveals no gallop and no friction rub.  No murmur heard. Pulmonary/Chest: Effort normal and breath sounds normal. No stridor. No respiratory distress. He has no wheezes. He has no rales. He exhibits no tenderness.  Abdominal: He exhibits no distension.  Neurological: He is alert and oriented to person, place, and time.  Ambulatory without difficulty  Skin: Skin is warm and dry.  Psychiatric: He has a normal mood and affect. His behavior is normal.  Nursing note and vitals reviewed.    ED Treatments / Results  Labs (all labs ordered are listed, but only abnormal results are displayed) Labs Reviewed  COMPREHENSIVE METABOLIC PANEL - Abnormal; Notable for the following components:      Result Value   Potassium 3.3 (*)    Glucose, Bld 111 (*)    Calcium 8.3 (*)    All other components within normal limits  ETHANOL - Abnormal; Notable for the following components:   Alcohol, Ethyl (B) 325 (*)    All other components within normal limits  CBC - Abnormal; Notable for the following components:   RBC 4.15 (*)    Hemoglobin 12.7 (*)    HCT 37.5 (*)    All other components within normal limits  RAPID URINE DRUG SCREEN, HOSP PERFORMED - Abnormal; Notable for the following components:   Benzodiazepines POSITIVE (*)    All other components within normal limits    EKG  EKG Interpretation None       Radiology No results found.  Procedures Procedures (including critical care time)  Medications Ordered in ED Medications - No data to display   Initial Impression / Assessment and Plan / ED Course  I have reviewed the triage vital signs and the nursing notes.  Pertinent labs & imaging results that were available during my care of the patient were reviewed by me and considered in my medical decision making (see chart for details).  57 year old male presents with alcohol  intoxication. Exam is overall unchanged. Labs are remarkable for anemia, mild hypokalemia, and ETOH of 325 which is similar to prior values. He is ambulatory without difficulty. Discussed psych eval for depressive symptoms resulting in multiple ED visits however he denies SI or trying to harm himself currently and would like to be discharged. He was advised to return if worsening and he expressed understanding and appreciation.  Final Clinical Impressions(s) / ED Diagnoses   Final diagnoses:  Acute alcoholic intoxication without complication [facility](HCC)  Hypokalemia    ED Discharge Orders    None       Bethel BornGekas, Wesam Gearhart Marie, PA-C 03/27/17 0017    Dione BoozeGlick, David, MD 03/27/17 [phone]

## 2017-03-27 NOTE — ED Notes (Signed)
Pt remains in Triage 5 waiting for available room.

## 2017-03-27 NOTE — ED Notes (Signed)
Pt sleeping. 

## 2017-03-27 NOTE — ED Notes (Signed)
Went to check on patient and room was empty, pt had left without being seen by provider.

## 2017-03-27 NOTE — ED Triage Notes (Signed)
Pt presents to for evaluation of being "sick" and reports to drinking alcohol tonight. Pt denies any SI/HI.
